# Patient Record
Sex: Male | Born: 1935 | Race: White | Hispanic: No | Smoking: Former smoker
Health system: Southern US, Community
[De-identification: ages and names within clinical notes are randomized; demographics above are authoritative.]

## PROBLEM LIST (undated history)

## (undated) DIAGNOSIS — I509 Heart failure, unspecified: Secondary | ICD-10-CM

## (undated) DIAGNOSIS — I10 Essential (primary) hypertension: Secondary | ICD-10-CM

## (undated) DIAGNOSIS — C3490 Malignant neoplasm of unspecified part of unspecified bronchus or lung: Secondary | ICD-10-CM

## (undated) DIAGNOSIS — E119 Type 2 diabetes mellitus without complications: Secondary | ICD-10-CM

## (undated) DIAGNOSIS — N189 Chronic kidney disease, unspecified: Secondary | ICD-10-CM

## (undated) DIAGNOSIS — I4891 Unspecified atrial fibrillation: Principal | ICD-10-CM

## (undated) DIAGNOSIS — J449 Chronic obstructive pulmonary disease, unspecified: Secondary | ICD-10-CM

## (undated) DIAGNOSIS — E785 Hyperlipidemia, unspecified: Secondary | ICD-10-CM

## (undated) HISTORY — DX: Essential (primary) hypertension: I10

## (undated) HISTORY — DX: Hyperlipidemia, unspecified: E78.5

## (undated) HISTORY — DX: Malignant neoplasm of unspecified part of unspecified bronchus or lung: C34.90

## (undated) HISTORY — PX: LUNG REMOVAL, PARTIAL: SHX233

## (undated) HISTORY — PX: CHOLECYSTECTOMY: SHX55

---

## 1998-05-28 ENCOUNTER — Ambulatory Visit (HOSPITAL_COMMUNITY): Admission: RE | Admit: 1998-05-28 | Discharge: 1998-05-28 | Payer: Self-pay | Admitting: Surgery

## 1998-11-23 ENCOUNTER — Encounter: Payer: Self-pay | Admitting: Surgery

## 1998-11-23 ENCOUNTER — Ambulatory Visit (HOSPITAL_COMMUNITY): Admission: RE | Admit: 1998-11-23 | Discharge: 1998-11-23 | Payer: Self-pay | Admitting: Surgery

## 1999-05-16 ENCOUNTER — Ambulatory Visit (HOSPITAL_COMMUNITY): Admission: RE | Admit: 1999-05-16 | Discharge: 1999-05-16 | Payer: Self-pay | Admitting: Surgery

## 1999-05-16 ENCOUNTER — Encounter: Payer: Self-pay | Admitting: Surgery

## 1999-11-15 ENCOUNTER — Ambulatory Visit (HOSPITAL_COMMUNITY): Admission: RE | Admit: 1999-11-15 | Discharge: 1999-11-15 | Payer: Self-pay | Admitting: Surgery

## 1999-11-15 ENCOUNTER — Encounter: Payer: Self-pay | Admitting: Surgery

## 2000-05-18 ENCOUNTER — Encounter: Payer: Self-pay | Admitting: Surgery

## 2000-05-18 ENCOUNTER — Ambulatory Visit (HOSPITAL_COMMUNITY): Admission: RE | Admit: 2000-05-18 | Discharge: 2000-05-18 | Payer: Self-pay | Admitting: Surgery

## 2000-11-19 ENCOUNTER — Ambulatory Visit (HOSPITAL_COMMUNITY): Admission: RE | Admit: 2000-11-19 | Discharge: 2000-11-19 | Payer: Self-pay | Admitting: Surgery

## 2000-11-19 ENCOUNTER — Encounter: Payer: Self-pay | Admitting: Surgery

## 2000-12-03 ENCOUNTER — Ambulatory Visit (HOSPITAL_COMMUNITY): Admission: RE | Admit: 2000-12-03 | Discharge: 2000-12-03 | Payer: Self-pay | Admitting: Surgery

## 2000-12-03 ENCOUNTER — Encounter: Payer: Self-pay | Admitting: Surgery

## 2001-05-21 ENCOUNTER — Encounter: Payer: Self-pay | Admitting: Surgery

## 2001-05-21 ENCOUNTER — Ambulatory Visit (HOSPITAL_COMMUNITY): Admission: RE | Admit: 2001-05-21 | Discharge: 2001-05-21 | Payer: Self-pay | Admitting: Surgery

## 2001-06-03 ENCOUNTER — Encounter: Payer: Self-pay | Admitting: Surgery

## 2001-06-03 ENCOUNTER — Ambulatory Visit (HOSPITAL_COMMUNITY): Admission: RE | Admit: 2001-06-03 | Discharge: 2001-06-03 | Payer: Self-pay | Admitting: Surgery

## 2001-09-20 ENCOUNTER — Encounter: Admission: RE | Admit: 2001-09-20 | Discharge: 2001-09-20 | Payer: Self-pay | Admitting: Family Medicine

## 2001-09-20 ENCOUNTER — Encounter: Payer: Self-pay | Admitting: Family Medicine

## 2001-11-15 ENCOUNTER — Encounter: Payer: Self-pay | Admitting: Surgery

## 2001-11-15 ENCOUNTER — Ambulatory Visit (HOSPITAL_COMMUNITY): Admission: RE | Admit: 2001-11-15 | Discharge: 2001-11-15 | Payer: Self-pay | Admitting: Surgery

## 2002-05-23 ENCOUNTER — Ambulatory Visit (HOSPITAL_COMMUNITY): Admission: RE | Admit: 2002-05-23 | Discharge: 2002-05-23 | Payer: Self-pay | Admitting: Surgery

## 2002-05-23 ENCOUNTER — Encounter: Payer: Self-pay | Admitting: Surgery

## 2002-11-24 ENCOUNTER — Ambulatory Visit (HOSPITAL_COMMUNITY): Admission: RE | Admit: 2002-11-24 | Discharge: 2002-11-24 | Payer: Self-pay | Admitting: Surgery

## 2002-11-24 ENCOUNTER — Encounter: Payer: Self-pay | Admitting: Surgery

## 2003-03-10 ENCOUNTER — Encounter: Admission: RE | Admit: 2003-03-10 | Discharge: 2003-03-10 | Payer: Self-pay | Admitting: Family Medicine

## 2003-03-10 ENCOUNTER — Encounter: Payer: Self-pay | Admitting: Family Medicine

## 2003-06-01 ENCOUNTER — Encounter: Payer: Self-pay | Admitting: Surgery

## 2003-06-01 ENCOUNTER — Ambulatory Visit (HOSPITAL_COMMUNITY): Admission: RE | Admit: 2003-06-01 | Discharge: 2003-06-01 | Payer: Self-pay | Admitting: Surgery

## 2003-11-23 ENCOUNTER — Ambulatory Visit (HOSPITAL_COMMUNITY): Admission: RE | Admit: 2003-11-23 | Discharge: 2003-11-23 | Payer: Self-pay | Admitting: Surgery

## 2004-05-30 ENCOUNTER — Ambulatory Visit (HOSPITAL_COMMUNITY): Admission: RE | Admit: 2004-05-30 | Discharge: 2004-05-30 | Payer: Self-pay | Admitting: Surgery

## 2004-11-25 ENCOUNTER — Ambulatory Visit (HOSPITAL_COMMUNITY): Admission: RE | Admit: 2004-11-25 | Discharge: 2004-11-25 | Payer: Self-pay | Admitting: Surgery

## 2005-11-25 ENCOUNTER — Encounter: Admission: RE | Admit: 2005-11-25 | Discharge: 2005-11-25 | Payer: Self-pay | Admitting: Family Medicine

## 2006-05-27 ENCOUNTER — Encounter: Admission: RE | Admit: 2006-05-27 | Discharge: 2006-05-27 | Payer: Self-pay | Admitting: Family Medicine

## 2006-11-26 ENCOUNTER — Encounter: Admission: RE | Admit: 2006-11-26 | Discharge: 2006-11-26 | Payer: Self-pay | Admitting: Family Medicine

## 2007-01-05 ENCOUNTER — Encounter: Admission: RE | Admit: 2007-01-05 | Discharge: 2007-01-05 | Payer: Self-pay | Admitting: Family Medicine

## 2007-01-07 ENCOUNTER — Encounter: Admission: RE | Admit: 2007-01-07 | Discharge: 2007-01-07 | Payer: Self-pay | Admitting: Family Medicine

## 2007-01-22 ENCOUNTER — Encounter (INDEPENDENT_AMBULATORY_CARE_PROVIDER_SITE_OTHER): Payer: Self-pay | Admitting: *Deleted

## 2007-01-22 ENCOUNTER — Inpatient Hospital Stay (HOSPITAL_COMMUNITY): Admission: RE | Admit: 2007-01-22 | Discharge: 2007-01-29 | Payer: Self-pay | Admitting: Surgery

## 2007-03-10 ENCOUNTER — Ambulatory Visit: Payer: Self-pay | Admitting: Pulmonary Disease

## 2007-03-11 ENCOUNTER — Ambulatory Visit: Payer: Self-pay | Admitting: Cardiology

## 2007-03-11 LAB — CONVERTED CEMR LAB: INR: 1.2 (ref 0.9–2.0)

## 2007-03-19 ENCOUNTER — Ambulatory Visit (HOSPITAL_COMMUNITY): Admission: RE | Admit: 2007-03-19 | Discharge: 2007-03-19 | Payer: Self-pay | Admitting: Pulmonary Disease

## 2007-03-19 ENCOUNTER — Encounter (INDEPENDENT_AMBULATORY_CARE_PROVIDER_SITE_OTHER): Payer: Self-pay | Admitting: Specialist

## 2007-03-22 ENCOUNTER — Ambulatory Visit (HOSPITAL_COMMUNITY): Admission: RE | Admit: 2007-03-22 | Discharge: 2007-03-22 | Payer: Self-pay | Admitting: Surgery

## 2007-03-30 ENCOUNTER — Encounter: Admission: RE | Admit: 2007-03-30 | Discharge: 2007-03-30 | Payer: Self-pay | Admitting: Diagnostic Radiology

## 2007-04-05 ENCOUNTER — Ambulatory Visit: Payer: Self-pay | Admitting: Pulmonary Disease

## 2008-03-15 DIAGNOSIS — E785 Hyperlipidemia, unspecified: Secondary | ICD-10-CM

## 2008-03-15 DIAGNOSIS — I10 Essential (primary) hypertension: Secondary | ICD-10-CM | POA: Insufficient documentation

## 2008-03-16 ENCOUNTER — Ambulatory Visit: Payer: Self-pay | Admitting: Pulmonary Disease

## 2008-03-16 ENCOUNTER — Ambulatory Visit: Payer: Self-pay | Admitting: Internal Medicine

## 2008-03-16 DIAGNOSIS — R0602 Shortness of breath: Secondary | ICD-10-CM | POA: Insufficient documentation

## 2008-03-16 DIAGNOSIS — J9610 Chronic respiratory failure, unspecified whether with hypoxia or hypercapnia: Secondary | ICD-10-CM | POA: Insufficient documentation

## 2008-03-16 LAB — CONVERTED CEMR LAB
CO2: 38 meq/L — ABNORMAL HIGH (ref 19–32)
Chloride: 96 meq/L (ref 96–112)
Sodium: 144 meq/L (ref 135–145)

## 2008-03-17 DIAGNOSIS — J984 Other disorders of lung: Secondary | ICD-10-CM | POA: Insufficient documentation

## 2008-03-17 DIAGNOSIS — J90 Pleural effusion, not elsewhere classified: Secondary | ICD-10-CM | POA: Insufficient documentation

## 2008-03-31 ENCOUNTER — Telehealth (INDEPENDENT_AMBULATORY_CARE_PROVIDER_SITE_OTHER): Payer: Self-pay | Admitting: *Deleted

## 2008-04-13 ENCOUNTER — Ambulatory Visit: Payer: Self-pay | Admitting: Pulmonary Disease

## 2008-04-27 ENCOUNTER — Ambulatory Visit: Payer: Self-pay | Admitting: Pulmonary Disease

## 2008-04-29 ENCOUNTER — Encounter: Payer: Self-pay | Admitting: Pulmonary Disease

## 2008-05-05 ENCOUNTER — Ambulatory Visit: Payer: Self-pay | Admitting: Pulmonary Disease

## 2008-08-17 ENCOUNTER — Ambulatory Visit: Payer: Self-pay | Admitting: Pulmonary Disease

## 2008-08-17 DIAGNOSIS — J438 Other emphysema: Secondary | ICD-10-CM | POA: Insufficient documentation

## 2009-02-15 ENCOUNTER — Ambulatory Visit: Payer: Self-pay | Admitting: Pulmonary Disease

## 2009-08-14 ENCOUNTER — Ambulatory Visit: Payer: Self-pay | Admitting: Pulmonary Disease

## 2009-12-16 ENCOUNTER — Emergency Department (HOSPITAL_COMMUNITY): Admission: EM | Admit: 2009-12-16 | Discharge: 2009-12-16 | Payer: Self-pay | Admitting: Emergency Medicine

## 2010-02-13 ENCOUNTER — Ambulatory Visit: Payer: Self-pay | Admitting: Pulmonary Disease

## 2010-02-13 DIAGNOSIS — J209 Acute bronchitis, unspecified: Secondary | ICD-10-CM

## 2010-08-21 ENCOUNTER — Ambulatory Visit: Payer: Self-pay | Admitting: Pulmonary Disease

## 2010-12-29 ENCOUNTER — Encounter: Payer: Self-pay | Admitting: Pulmonary Disease

## 2011-01-07 NOTE — Assessment & Plan Note (Signed)
Summary: rov for severe emphysema   Primary Provider/Referring Provider:  Lupe Carney  CC:  Pt is here for a 6 month f/u appt.  Pt states he thought he recently had "bronchitis" but states his breathing is improving and is now coughing up white sputum.  Pt denied any other complaints.  .  History of Present Illness: The pt comes in today for f/u of his known severe emphysema with chronic respiratory failure.  He has been at a stable baseline, but one to two weeks ago developed chest congestion with cough and purulent mucus.  He has been "waiting it out", and taking mucinex.  His mucus has just cleared, and his congestion is improving.  He denies any f/c/s.  Current Medications (verified): 1)  Furosemide 40 Mg  Tabs (Furosemide) .... Take 1 Tablet By Mouth Once A Day 2)  Capoten 12.5 Mg  Tabs (Captopril) .... Take 2 Tabs By Mouth Daily 3)  Proair Hfa 108 (90 Base) Mcg/act  Aers (Albuterol Sulfate) .... Inhale 2 Puffs Every 4 To 6 Hours As Needed 4)  Mucinex 600 Mg  Tb12 (Guaifenesin) .... Take 1 Tablet By Mouth Two Times A Day 5)  Allegra 180 Mg  Tabs (Fexofenadine Hcl) .... Take 1 Tablet By Mouth Once A Day As Needed 6)  Temazepam 30 Mg  Caps (Temazepam) .... Take By Mouth As Needed 7)  Spiriva Handihaler 18 Mcg  Caps (Tiotropium Bromide Monohydrate) .... Once Daily 8)  Symbicort 160-4.5 Mcg/act  Aero (Budesonide-Formoterol Fumarate) .... 2 Puffs Every 12 Hours 9)  Coumadin 5 Mg Tabs (Warfarin Sodium) .... Take As Directed By [facility] 10)  Metoprolol Tartrate 25 Mg Tabs (Metoprolol Tartrate) .... Take 1 Tablet By Mouth Once A Day  Allergies (verified): No Known Drug Allergies  Review of Systems      See HPI  Vital Signs:  Patient profile:   77 year old male Height:      67 inches Weight:      240.13 pounds O2 Sat:      92 % on 2 LPM pulsed Temp:     98.2 degrees F oral Pulse rate:   108 / minute BP sitting:   180 / 70  (left arm) Cuff size:   regular  Vitals Entered  By: Arman Filter LPN (February 13, 4165 9:46 AM)  O2 Flow:  2 LPM pulsed CC: Pt is here for a 6 month f/u appt.  Pt states he thought he recently had "bronchitis" but states his breathing is improving and is now coughing up white sputum.  Pt denied any other complaints.   Comments Medications reviewed with patient Arman Filter LPN  February 13, 629 9:46 AM    Physical Exam  General:  ow male in nad Lungs:  decreased bs throughout, but no wheezing or rhonchi Heart:  rrr Extremities:  1+ edema right>>left no cyanosis Neurologic:  alert and oriented,  moves all 4.   Impression & Recommendations:  Problem # 1:  EMPHYSEMA, SEVERE (ICD-492.8)  the pt is doing well from a pulmonary standpoint.  He is getting over an episode of what sounds like viral bronchitis, but I have asked him to call us in the future if this occurs and lasts more than a week or if it results in worsening sob.  I have asked him to continue his current meds, and to try and stay as active as possible.   Problem # 2:  ACUTE BRONCHITIS (ICD-466.0) probably viral in nature.  He is improving.  Medications Added to Medication List This Visit: 1)  Mucinex 600 Mg Tb12 (Guaifenesin) .... Take 1 tablet by mouth two times a day  Other Orders: Est. Patient Level III (69629)  Patient Instructions: 1)  no change in medications 2)  please call if you get a chest cold that lasts more than a week, or if the cold causes you to be more short of breath. 3)  followup with me in 6mos.   Immunization History:  Influenza Immunization History:    Influenza:  historical (08/08/2009)  Pneumovax Immunization History:    Pneumovax:  historical (12/08/2008)

## 2011-01-07 NOTE — Assessment & Plan Note (Signed)
Summary: rov for emphysema with chronic respiratory failure.   Primary Provider/Referring Provider:  Lupe Carney  CC:  Pt is here for a 6 month f/u appt on his emphysema.  Pt denied changes in breathing- still has periods of sob with exertion.  Marland Kitchen  History of Present Illness: the pt comes in today for f/u of his known emphysema with chronic respiratory failure.  He feels that his doe is at his usual baseline, and reports no cough or purulence.  He has been compliant with his meds, and reports no acute exacerbation since his last visit.  Medications Prior to Update: 1)  Furosemide 40 Mg  Tabs (Furosemide) .... Take 1 Tablet By Mouth Once A Day 2)  Capoten 12.5 Mg  Tabs (Captopril) .... Take 2 Tabs By Mouth Daily 3)  Proair Hfa 108 (90 Base) Mcg/act  Aers (Albuterol Sulfate) .... Inhale 2 Puffs Every 4 To 6 Hours As Needed 4)  Mucinex 600 Mg  Tb12 (Guaifenesin) .... Take 1 Tablet By Mouth Two Times A Day 5)  Allegra 180 Mg  Tabs (Fexofenadine Hcl) .... Take 1 Tablet By Mouth Once A Day As Needed 6)  Temazepam 30 Mg  Caps (Temazepam) .... Take By Mouth As Needed 7)  Spiriva Handihaler 18 Mcg  Caps (Tiotropium Bromide Monohydrate) .... Once Daily 8)  Symbicort 160-4.5 Mcg/act  Aero (Budesonide-Formoterol Fumarate) .... 2 Puffs Every 12 Hours 9)  Coumadin 5 Mg Tabs (Warfarin Sodium) .... Take As Directed By [facility] 10)  Metoprolol Tartrate 25 Mg Tabs (Metoprolol Tartrate) .... Take 1 Tablet By Mouth Once A Day  Allergies (verified): No Known Drug Allergies  Review of Systems       The patient complains of shortness of breath with activity, acid heartburn, indigestion, nasal congestion/difficulty breathing through nose, and hand/feet swelling.  The patient denies shortness of breath at rest, productive cough, non-productive cough, coughing up blood, chest pain, irregular heartbeats, loss of appetite, weight change, abdominal pain, difficulty swallowing, sore throat, tooth/dental  problems, headaches, sneezing, itching, ear ache, anxiety, depression, joint stiffness or pain, rash, change in color of mucus, and fever.    Vital Signs:  Patient profile:   77 year old male Height:      67 inches Weight:      247.50 pounds BMI:     38.90 O2 Sat:      92 % on 2 LPM pulsed Temp:     97.7 degrees F oral Pulse rate:   46 / minute BP sitting:   136 / 72  (left arm) Cuff size:   regular  Vitals Entered By: Arman Filter LPN (August 21, 2010 2:16 PM)  O2 Flow:  2 LPM pulsed CC: Pt is here for a 6 month f/u appt on his emphysema.  Pt denied changes in breathing- still has periods of sob with exertion.   Comments Medications reviewed with patient Arman Filter LPN  August 21, 2010 2:16 PM    Physical Exam  General:  obese male in nad Lungs:  decreased bs, no wheezing  a few basilar crackles. Heart:  rrr Extremities:  2+ edema bilat, no cyanosis Neurologic:  alert and oriented, moves all 4.   Impression & Recommendations:  Problem # 1:  EMPHYSEMA, SEVERE (ICD-492.8) the pt appears to be at his usual baseline, and has not had any recent flares.  I have asked him to stay on his current meds, work on weight loss and some type of physical activity, and will give flu  shot today as well.  He is to keep track of his LE edema, and to f/u with his primary if worsens.  Problem # 2:  CHRONIC RESPIRATORY FAILURE (ICD-518.83) the pt has adequate sats on his current oxygen level.  Other Orders: Est. Patient Level III (64403) Influenza Vaccine MCR (47425) T-2 View CXR (71020TC)  Patient Instructions: 1)  will give flu shot today. 2)  no change in meds 3)  stay as active as you can 4)  followup with me in 6mos.   Immunizations Administered:  Influenza Vaccine # 1:    Vaccine Type: Fluvax MCR    Site: left deltoid    Mfr: GlaxoSmithKline    Dose: 0.5 ml    Route: IM    Given by: Michel Bickers CMA    Exp. Date: 06/07/2011    Lot #: [id number]  Flu Vaccine  Consent Questions:    Do you have a history of severe allergic reactions to this vaccine? no    Any prior history of allergic reactions to egg and/or gelatin? no    Do you have a sensitivity to the preservative Thimersol? no    Do you have a past history of Guillan-Barre Syndrome? no    Do you currently have an acute febrile illness? no    Have you ever had a severe reaction to latex? no    Vaccine information given and explained to patient? yes

## 2011-02-19 ENCOUNTER — Encounter: Payer: Self-pay | Admitting: Pulmonary Disease

## 2011-02-19 ENCOUNTER — Ambulatory Visit (INDEPENDENT_AMBULATORY_CARE_PROVIDER_SITE_OTHER): Payer: Medicare Other | Admitting: Pulmonary Disease

## 2011-02-19 DIAGNOSIS — J438 Other emphysema: Secondary | ICD-10-CM

## 2011-02-19 DIAGNOSIS — J9610 Chronic respiratory failure, unspecified whether with hypoxia or hypercapnia: Secondary | ICD-10-CM

## 2011-02-25 NOTE — Assessment & Plan Note (Signed)
Summary: rov for severe emphysema   Primary Provider/Referring Provider:  Lupe Carney  CC:  6 month f/u appt.  Pt does c/o increased sob with exertion but wonders if this is d/t "allergies."  Pt states he does cough up white sputum.  Pt states his Symbicort was malfunctioning recently. Marland Kitchen  History of Present Illness: the pt comes in today for f/u of his emphysema and chronic respiratory failure.  He is doing fairly well, and maintaining a stable baseline.  He has had no recent acute exacerbation or pulmonary infection.  He is having some allergy symptoms, but no chest congestion or purulence.  Current Medications (verified): 1)  Metformin Hcl 500 Mg Tabs (Metformin Hcl) .... Take 1 Tablet By Mouth Once A Day 2)  Capoten 12.5 Mg  Tabs (Captopril) .... Take 2 Tabs By Mouth Daily 3)  Metoprolol Succinate 50 Mg Xr24h-Tab (Metoprolol Succinate) .... Take 1 Tablet By Mouth Once A Day 4)  Furosemide 40 Mg  Tabs (Furosemide) .... Take 1 Tablet By Mouth Two Times A Day 5)  Coumadin 5 Mg Tabs (Warfarin Sodium) .... Take As Directed By [facility] 6)  Symbicort 160-4.5 Mcg/act  Aero (Budesonide-Formoterol Fumarate) .... 2 Puffs Every 12 Hours 7)  Spiriva Handihaler 18 Mcg  Caps (Tiotropium Bromide Monohydrate) .... Once Daily 8)  Proair Hfa 108 (90 Base) Mcg/act  Aers (Albuterol Sulfate) .... Inhale 2 Puffs Every 4 To 6 Hours As Needed 9)  Mucinex 600 Mg  Tb12 (Guaifenesin) .... Take 1 Tablet By Mouth Two Times A Day 10)  Allegra 180 Mg  Tabs (Fexofenadine Hcl) .... Take 1 Tablet By Mouth Once A Day As Needed 11)  Temazepam 30 Mg  Caps (Temazepam) .... Take By Mouth As Needed  Allergies (verified): No Known Drug Allergies  Past History:  Past Medical History:  DYSLIPIDEMIA (ICD-272.4) HYPERTENSION (ICD-401.9)  Lung Cancer  Review of Systems       The patient complains of shortness of breath with activity, productive cough, acid heartburn, indigestion, weight change, sneezing, and  hand/feet swelling.  The patient denies shortness of breath at rest, non-productive cough, coughing up blood, chest pain, irregular heartbeats, loss of appetite, abdominal pain, difficulty swallowing, sore throat, tooth/dental problems, headaches, nasal congestion/difficulty breathing through nose, itching, ear ache, anxiety, depression, joint stiffness or pain, rash, change in color of mucus, and fever.    Vital Signs:  Patient profile:   77 year old male Height:      67 inches Weight:      238.25 pounds O2 Sat:      88 % on 2 LPM pulsed Temp:     98.0 degrees F oral Pulse rate:   85 / minute BP sitting:   138 / 70  (left arm) Cuff size:   regular  Vitals Entered By: Arman Filter LPN (February 19, 2011 9:03 AM)  O2 Flow:  2 LPM pulsed CC: 6 month f/u appt.  Pt does c/o increased sob with exertion but wonders if this is d/t "allergies."  Pt states he does cough up white sputum.  Pt states his Symbicort was malfunctioning recently.  Comments rechecked pt's o2 sats after pt rested for a few mins and took some deep breaths.  o2 sats increased to 93% on 2 LPM pulsed at rest.  Medications reviewed with patient Arman Filter LPN  February 19, 2011 9:04 AM    Physical Exam  General:  ow male in nad  Lungs:  mildly decreased bs, no wheezing or crackles.  Heart:  rrr Extremities:  2+ edema bilat, no cyanosis  Neurologic:  alert and oriented, moves all 4    Impression & Recommendations:  Problem # 1:  EMPHYSEMA, SEVERE (ICD-492.8)  the pt is maintaining a stable baseline, with no recent acute exacerbation or pulmonary infection.  I have asked him to stay on his current meds, and to work on weight loss and conditioning.  I have also asked him to increase his oxygen to 3lpm pulses if he is going to do SUSTAINED activity.    Medications Added to Medication List This Visit: 1)  Metformin Hcl 500 Mg Tabs (Metformin hcl) .... Take 1 tablet by mouth once a day 2)  Metoprolol Succinate 50 Mg  Xr24h-tab (Metoprolol succinate) .... Take 1 tablet by mouth once a day 3)  Furosemide 40 Mg Tabs (Furosemide) .... Take 1 tablet by mouth two times a day  Other Orders: Est. Patient Level III (81191)  Patient Instructions: 1)  stay on current meds 2)  try to stay as active as possible, work on some weight loss 3)  followup with me in 6mos.

## 2011-04-25 NOTE — Assessment & Plan Note (Signed)
[facility]                             PULMONARY OFFICE NOTE   NAME:COSTNEREssex, Perry                    MRN:          [id number]  DATE:03/10/2007                            DOB:          Apr 29, 1921    HISTORY OF PRESENT ILLNESS:  The patient is an 77 year old gentleman who  I have been asked to see for a pleural effusion.  The patient has a  history of lung cancer dating back to 1991 and underwent lobectomy on  the left by Dr. Tanda Rockers.  He did not require chemotherapy or radiation.  I do not have the details of this nor the cell type.  The patient  recently underwent cholecystectomy during the middle to end of February  and was never told anything about having a pleural effusion during that  hospitalization.  The patient states that he has been short of breath  every since his gallbladder surgery and required oxygen while in the  hospital and ultimately he required oxygen at home.  The patient has had  no significant cough or mucus production.  He was found to have a  pleural effusion about 2 weeks ago and was felt to possibly have  pneumonia and was placed on Ciprofloxacin.  However, it should be noted  during this time that he did not have cough, mucus or congestion.  He  had not had any fevers, chills or sweats.  The patient denies any  pleuritic chest pain or hemoptysis.  He has had lower extremity edema  but no calf tenderness. The patient was started on Coumadin just before  the surgery and kept on that at discharge.  His INR has not been  therapeutic and they have been trying to adjust the dose upward.  The  patient is unclear about why he is on Coumadin but I do note on his  discharge summary from his February hospitalization that there is a  questionable history of atrial fibrillations. The patient denies knowing  anything about this.  The patient denies any trauma to his right chest  recently.   PAST MEDICAL HISTORY:  1. Is significant  for hypertension.  2. History of lung cancer with lobectomy as stated above.  3. History of dyslipidemia.  4. Status post cholecystectomy.   MEDICATIONS:  Include:  1. Lasix 20 mg 2 daily.  2. Capat 20 mg 1 b.i.d.  3. Coumadin in varying doses.  4. K-Dur 10 mEq daily.   The patient has no known drug allergies.   SOCIAL HISTORY:  He has a history of smoking 1-1/2 packs per day for 55  years.  He has not smoked since 1980.  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   FAMILY HISTORY:  Is remarkable for his father having heart disease,  otherwise noncontributory.   REVIEW OF SYSTEMS:  As per history of present illness. Also see patient  intake form documented in the chart.   PHYSICAL EXAMINATION:  In general he is an obese male in no acute  distress.  Blood pressure is 146/62, pulse 64, temperature is 98.1.  Weight is  231  pounds.  O2 saturation on 1 liter is at 86%.  His oxygen was increased  to 2 L and it is 91%.  HEENT:  Pupils are equal and reactive to light and accommodation.  Extraocular muscles were intact.  Nares are patent without discharge,  oropharynx is clear.  NECK:  Is supple without JVD or lymphadenopathy.  There is no palpable  thyromegaly.  CHEST:  Reveals very decreased breath sounds both bases but on the right  especially with crackles being present.  There is no wheezes or rhonchi.  CARDIAC:  Reveals regular rate and rhythm with a 2/6 systolic murmur.  ABDOMEN:  Soft, nontender with good bowel sounds.  GENITAL EXAM, RECTAL EXAM, BREAST EXAM:  Was not done and not indicated.  LOWER EXTREMITIES:  Shows 2 to 3+ lower extremity edema.  Pulses were  intact distally.  There was no calf tenderness.  NEUROLOGICALLY:  Alert and oriented with no obvious observable motor  defects.   LABORATORY DATA:  Chest x-ray is reviewed from earlier in March and also  the 28 of March and there is a pleural effusion which appears to have  loculation characteristics. I  would also wonder whether there is an  underlying mass under all of this.  I will have to go back and look at  his x-rays while having his cholecystectomy to see if that was an issue  at that time.   IMPRESSION:  Right pleural effusion which appears loculated by chest x-  ray today.  Possibilities include parapneumonic effusion with loculation  or possibly related to his recent gallbladder surgery.  He did have some  fluid around his liver at the time of his surgery.  I would also  consider the possibility of congestive heart failure in light of his  significant cardiomegaly on chest x-ray; however, again it does appear  to be loculated.  I would also be concerned about the possibility of an  acute pulmonary embolus given his lower extremity edema and his non-  therapeutic Coumadin.  At this point in time, I need a little better  idea of what we are dealing with in terms of quantity of fluid and  whether or not it is loculated and there is underlying pathology.  We  will go ahead and schedule him for a CT scan of the chest.   PLAN:  1. I have asked the patient to increase his oxygen to 2 L 24 hours a      day.  2. Need records from Dr. Carolanne Grumbling for his cardiac status.  3. It is unclear to me why he is on Coumadin unless it is for      paroxysmal atrial fibrillation.  If the patient is to have any      intervention into the right effusion he will need to be off of this      and therefore I have asked him to discontinue it.  4. Spiral CT of the chest to rule out PE and evaluate his right      effusion.     Barbaraann Share, MD,FCCP  Electronically Signed    [facility]  DD: 03/11/2007  DT: 03/11/2007  Job #: [id number]   cc:   L. Lupe Carney, M.D.

## 2011-04-25 NOTE — Discharge Summary (Signed)
NAME:  Garrett Reese, BAIR NO.:  [id number]   MEDICAL RECORD NO.:  [id number]          PATIENT TYPE:  INP   LOCATION:  1433                         FACILITY:  [facility]   PHYSICIAN:  Clovis Pu. Cornett, M.D.DATE OF BIRTH:  1921-03-06   DATE OF ADMISSION:  01/22/2007  DATE OF DISCHARGE:  01/29/2007                               DISCHARGE SUMMARY   DISCHARGE SUMMARY   ADMISSION DIAGNOSES:  1. Acute on chronic cholecystitis.  2. Congestive heart failure.  3. History of atrial fibrillation.  4. History of chronic obstructive pulmonary disease.  5. Hypertension.  6. History of lung cancer status post lobectomy.  7. Asthma.   PAST SURGICAL HISTORY:  History of cholecystectomy.   DISCHARGE DIAGNOSES:  Same as above.   PROCEDURES PERFORMED:  1. Laparoscopic converted to open cholecystectomy.  2. CT scan.   BRIEF HISTORY:  The patient is an 77 year old male admitted with acute  on chronic cholecystitis.  He underwent an open cholecystectomy  secondary to severe cholecystitis.  He was admitted afterwards to  Telemetry.   HOSPITAL COURSE:  The patient was seen by Cardiology post-op.  He was  felt to be of no significant cardiac risk.  His post-op course was  complicated by significant drainage out of his JP drain.  CT scan done  on January 25, 2007 showed some fluid around the liver.  There was some  concern this may be a myeloma but his JP drainage stopped.  He was  having no pain.  He was tolerating his diet and was ambulating.  He was  having some problems with saturations and was diuresed during his  hospital admission.  He has had a right pleural effusion but this was  managed by diuresis.  He was not short of breath.  His saturations on  room air were about 92%.  It is unclear what his baseline oxygen  saturations are, but he is not on any home O2.  He was feeling well and  was ambulating.  He had hypokalemia and this was addressed with K-Dur.  Of note, his CO2  was 41 at discharge but he was comfortable and not  tachypneic, and he was tolerating being off oxygen.  He wished to go  home at this point in time to recover and he was doing well enough that  he was discharged home in satisfactory condition.   DISCHARGE INSTRUCTIONS:  He will follow up with me next week to have his  staples and drain removed.  Home PT and home nursing have been set up  for him.  He will resume his Coumadin 2.5 mg and will need to contact  his medical doctor next week.  He will be on Cipro 500 mg p.o. b.i.d.  for another 7 days secondary to bilateral lower extremity cellulitis.  He will resume his other home meds.  Condition on discharge improved.      Thomas A. Cornett, M.D.  Electronically Signed     [facility]/MEDQ  D:  01/29/2007  T:  01/29/2007  Job:  [id number]

## 2011-04-25 NOTE — Consult Note (Signed)
NAME:  Garrett Reese, Garrett Reese NO.:  [id number]   MEDICAL RECORD NO.:  [id number]          PATIENT TYPE:  INP   LOCATION:  0162                         FACILITY:  [facility]   PHYSICIAN:  Corky Crafts, MDDATE OF BIRTH:  [birth year]   DATE OF CONSULTATION:  DATE OF DISCHARGE:                                 CONSULTATION   REFERRING PHYSICIAN:  Clovis Pu. Cornett, M.D.   PRIMARY CARE PHYSICIAN:  L. Lupe Carney, M.D.   PRIMARY CARDIOLOGIST:  Armanda Magic, M.D.   REASON FOR CONSULTATION:  Atrial fibrillation.   HISTORY OF PRESENT ILLNESS:  Patient is an 77 year old man with a  history of hypertension and a recent diagnosis of new onset of atrial  fibrillation on February 1.  He was seen as part of a preoperative  workup for Dr. Mayford Knife.  He required a cholecystectomy.  He was in normal  sinus rhythm during his preoperative evaluation.  Today in the PACU, he  was found to be in atrial fibrillation.  We are consulted for further  evaluation.   The patient had an echocardiogram on February 8th showing normal LV size  and systolic function.  He had an EF of 60%.  He had mild aortic  sclerosis.  He also had a Cardiolite showing no evidence of ischemia.   PAST MEDICAL HISTORY:  1. Hypertension.  2. Coronary artery disease.  3. Lung cancer, status post lobectomy.  4. Cholelithiasis.  5. Asthma.   ALLERGIES:  No known drug allergies.   PAST SURGICAL HISTORY:  Lung surgery, cholecystectomy.   MEDICATIONS:  1. Inderal 10 mg t.i.d.  2. Capoten 12.5 mg b.i.d.  3. Lasix 20 mg 2 tablets daily.  4. Atrovent 2 puffs t.i.d.  5. Cipro 500 mg b.i.d. x10 days.  6. Coumadin, currently on hold.   FAMILY HISTORY:  Father died at age 38.  Mother died at age 43.  Brother  died at age 3 of unknown reasons.   SOCIAL HISTORY:  Patient is widowed.  He has three adult children.  He  is retired.  He does not smoke.  He quit in 1980.  Does not drink or use  any illegal drugs.   REVIEW OF SYSTEMS:  Positive for right upper quadrant pain.  No chest  pain.  No shortness of breath.  No palpitations.  No orthopnea.  No PND.  All other systems are negative.   PHYSICAL EXAMINATION:  VITAL SIGNS:  Blood pressure is 153/57, heart  rate ranging from 60-75, respiratory rate 16.  He is 96% on 2 liters.  HEENT:  Head is normocephalic and atraumatic.  Extraocular movements are  intact.  NECK:  No JVD.  CARDIOVASCULAR:  Regular rate and rhythm.  Occasional premature beat.  LUNGS:  Clear to auscultation anteriorly.  ABDOMEN:  Mild right-sided tenderness.  EXTREMITIES:  No edema.  SKIN:  No rash.  NEURO:  No focal motor or sensory deficits.  PSYCH:  Normal mood and affect.   Telemetry shows normal sinus rhythm with occasional PVCs.   LABS:  Most recently from earlier today:  Troponin negative.   ASSESSMENT:  An 77 year old with recurrent atrial fibrillation  postoperatively.   PLAN:  1. Cardiac:  Likely, the atrial fibrillation was a result of the      stress from surgery.  I will continue Inderal 10 mg t.i.d.  This      certainly could be up-titrated to 20 mg t.i.d. if the patient will      tolerate p.o.  If he will not, I would use Lopressor IV 5 mg every      4 hours and p.r.n. if he does require rate control from going back      into the atrial fibrillation.  If  there is a concern regarding his      lung disease, Diltiazem could also be used.  2. Hypertension:  Patient will have his home medications restarted as      he starts tolerating p.o.  3. Restart Coumadin once a day from a bleeding standpoint.  This will      lower his risk of stroke, given his atrial fibrillation.  4. Will follow the patient while he is in the hospital.      Corky Crafts, MD  Electronically Signed     JSV/MEDQ  D:  01/22/2007  T:  01/22/2007  Job:  [id number]

## 2011-04-25 NOTE — Assessment & Plan Note (Signed)
[facility]                             PULMONARY OFFICE NOTE   NAME:Reese, Garrett KYLLONEN                    MRN:          [id number]  DATE:04/05/2007                            DOB:          [birth year]    SUBJECTIVE:  Garrett Reese comes in today for followup after his recent  thoracentesis. The patient underwent a CT scan of his chest to better  characterize his effusion and was not found to have a pulmonary embolus.  However, he was found to have a low-density mass within the gallbladder  fossa that was suspicious for an abscess. He also had bilateral pleural  effusions, right greater than left. There was also a 4.5-mm left lower  lobe pulmonary nodule. The patient had a biliary drain placed by  interventional radiology, and it is unclear whether this turned out to  be an infection or not. Drain has since been removed. I have asked the  patient to get in touch with Dr. Luisa Hart to find out what was the  etiology of this. In the interim, the patient has had a thoracentesis  done with no evidence of malignancy, but there was numerous lymphocytes  noted. The fluid was a borderline exudate with a glucose of 159, total  protein of 3.5 and a LDH of 111. He was found to have 960 white cells  with 96% being lymphocytes. All cultures are negative. The patient feels  that his breathing is much better since the effusion has been drained.   PHYSICAL EXAMINATION:  In general, he is an obese male in no acute  distress. Blood pressure 120/66, pulse 73, temperature is 98.3, weight  is 230 pounds. O2 saturation on 2 liters is 92%.  CHEST:  Reveals decreased breath sounds in both basis but better air  movement than the prior visit.  CARDIAC EXAM:  Reveals a regular rate and rhythm with a 2/6 systolic  murmur.   IMPRESSION:  1. Bilateral pleural effusions, right greater than left, which appear      to be borderline exudate with a significant lymphocytosis. The  differential for this may be nonspecific lymphocytosis, possibility      of TB pleurisy and finally whether or not the patient could have      lymphoma. Given the significant lymphocytosis, I would typically      lean toward thoracoscopic evaluation with pleural biopsies and flow      cytometry on the pleural fluid. However, this patient has been      through quite a bit over the last few months and is very, very      debilitated. He is just starting to get stronger and make some      headway in terms of quality of life. I think we ought to place a      PPI  at this point and to watch him clinically. Certainly, if the      fluid reaccumulates, would tap it again and send for flow      cytometry. I have a long discussion with both the daughter and the      patient  and explained that he may have a lymphoma. They are okay      with watching this over the next four to eight weeks.  2. Left lower lobe pulmonary nodule. It is unclear whether this is a      granuloma or possibly a new bronchogenic cancer or possibly a      metastases from his prior lung cancer. We will follow this up with      serial CTs.   PLAN:  1. Place PPD and will read in 48 hours.  2. Patient will follow up with me in 8 weeks for a chest x-ray. He is      to call if his shortness of breath worsens prior to that, and we      can see him for possible repeat thoracentesis and      flow cytometry on the pleural fluid.  3. The patient have a followup CT in 4 to 6 months for his left lower      lobe nodule.     Barbaraann Share, MD,FCCP  Electronically Signed    KMC/MedQ  DD: 04/05/2007  DT: 04/06/2007  Job #: [id number]   cc:   L. Lupe Carney, M.D.

## 2011-04-25 NOTE — Op Note (Signed)
NAME:  Garrett Reese, Garrett Reese NO.:  [id number]   MEDICAL RECORD NO.:  [id number]          PATIENT TYPE:  AMB   LOCATION:  DAY                          FACILITY:  [facility]   PHYSICIAN:  Thomas A. Cornett, M.D.DATE OF BIRTH:  1921/02/25   DATE OF PROCEDURE:  01/22/2007  DATE OF DISCHARGE:                               OPERATIVE REPORT   PREOP DIAGNOSIS:  Acute on chronic cholecystitis.   POSTOPERATIVE DIAGNOSIS:  Acute on chronic cholecystitis.   PROCEDURE:  Laparoscopic converted to open cholecystectomy with  intraoperative cholangiogram.   SURGEON:  Clovis Pu. Cornett, M.D.   ASSISTANT:  Currie Paris, M.D.   ANESTHESIA:  General endotracheal anesthesia.   ESTIMATED BLOOD LOSS:  200 mL.   DRAIN:  A 19 Blake drain to gallbladder fossa.   SPECIMEN:  Gallbladder with gallstones to pathology.   INDICATIONS FOR PROCEDURE:  The patient is an 77 year old male who  initially was seen, about 2 weeks ago, for acute-on-chronic  cholecystitis.  The acute component resolved, but he is in need of  cholecystectomy.  He presents, today, for that.  He was a significant  high operative risk, which we discussed, but given the nature of his  disease I felt he needed surgery to have this removed.  I discussed this  with the patient as well as potential complications.  He voiced  understanding and agreed to proceed.   DESCRIPTION OF PROCEDURE:  The patient was brought to the operating room  and placed supine.  After induction of general endotracheal anesthesia  the abdomen was prepped and draped in a sterile fashion.  A 1-cm  supraumbilical incision was made.  Dissection was carried down to the  fascia.  The fascia was opened with a scalpel blade, and two Kocher  clamps were used to grasp the fascia.  A pursestring suture of #0 Vicryl  was placed and a 12-mm Hassan cannula was placed under direct vision.  Pneumoperitoneum was created to 15 mmHg of CO2 and a laparoscope was  placed.  Laparoscopy performed.   The patient was placed in reverse Trendelenburg and rolled to his left.  A second 11-mm port was placed in the subxiphoid position.  Two 5-mm  ports were placed in the right midabdomen.  CO2 was insufflated to a  pressure initially of 12 and then to 15 mmHg of CO2 which the patient  tolerated.  There was significant inflammation around the gallbladder  with the omentum densely adherent to it.  We were able to identify the  dome of the gallbladder, grasp it; and it was very hard and woody.  We  able to push away the omentum away from the gallbladder, all the way  down to the infundibulum.  The omentum was stuck and it appeared be  tenting up the distal stomach or proximal duodenum up toward the  infundibulum.  We tediously tried to dissect this away, but it so  adherent that we were unable to separate the two safely.  At this point,  into the case, I felt it was prudent to open to get better visualization  due to  obscured anatomy. At this point in time, the CO2 was released and  laparoscopic ports were removed and passed off the field.   A right subcostal incision was then made.  Dissection was carried down  through the skin, subcutaneous fat, through the abdominal wall, and into  the abdominal cavity.  Retraction was obtained using moist soaked lap  pads in a sweetheart retractor.  I was able to grab the infundibulum of  the gallbladder and pull this up.  The area that we were working on near  the duodenum was uninjured.   Once I had this, I was able to dissect out the cystic duct as the only  tubular structure entering the gallbladder.  I placed a clip on the  gallbladder side, and made a small incision in the cystic duct; and  placed a Cook cholangiogram catheter was secured it with a clip.  Intraoperative cholangiogram was then performed which showed free flow  of contrast into the common duct with flow down through into the  duodenum.  There is  free flow of contrast up to the bifurcation of the  right-and-left hepatic ducts without extravasation or stricture.  There  was some delayed filling, but I did not see any stone at the ampulla.  This appeared to fill during live views.  At this point in time, I  removed the catheter, and the cystic duct was double clipped and then  divided.   Next the cystic artery was identified.  It was double clipped and  divided.  There was significant inflammation but we kept her dissection  on the body of the gallbladder, and used the cautery.  We then came dome  down to remove the remainder of the gallbladder at this point.  This was  passed off the field.  There is significant oozing from the gallbladder  bed; this was controlled with a combination of cautery, pressure, and  Surgicel.  Irrigation was used and suctioned out.  We reexamined the  cystic duct and there was good hemostasis without evidence of bile  leakage.  At this point in time, I suctioned all excess blood and bile  out after irrigating.  I then reexamined the area around the duodenum;  and this appeared to be without injury at this point in time.  Then,  through a separate stab incision, placed a 19-Blake drain in the  gallbladder fossa.  We then removed all our packs, and counts were  correct at this point.  I palpated the abdomen; and felt no retained  sponges.   At this point in time a drain was secured with a 3-0 nylon.  The fascia  was closed with two layers of #1 PDS.  Skin staples were used to close  skin.  The umbilical fascia was closed with a pursestring suture of #0  Vicryl.  Skin staples were used to close the skin.  All final counts of  sponge, needle, and instruments were found be correct in this portion of  the case.  Sterile dressings were applied.  The patient was then awoke  taken to recovery in satisfactory condition.   ESTIMATED BLOOD LOSS:  Roughly 200-250 mL.     Thomas A. Cornett, M.D.   Electronically Signed     TAC/MEDQ  D:  01/22/2007  T:  01/22/2007  Job:  [id number]   cc:   L. Lupe Carney, M.D.  Fax: [phone]

## 2011-08-19 ENCOUNTER — Encounter: Payer: Self-pay | Admitting: Pulmonary Disease

## 2011-08-20 ENCOUNTER — Encounter: Payer: Self-pay | Admitting: Pulmonary Disease

## 2011-08-20 ENCOUNTER — Ambulatory Visit (INDEPENDENT_AMBULATORY_CARE_PROVIDER_SITE_OTHER): Payer: Medicare Other | Admitting: Pulmonary Disease

## 2011-08-20 DIAGNOSIS — J438 Other emphysema: Secondary | ICD-10-CM

## 2011-08-20 DIAGNOSIS — Z23 Encounter for immunization: Secondary | ICD-10-CM

## 2011-08-20 DIAGNOSIS — J9610 Chronic respiratory failure, unspecified whether with hypoxia or hypercapnia: Secondary | ICD-10-CM

## 2011-08-20 NOTE — Progress Notes (Signed)
Addended by: Salli Quarry on: 08/20/2011 11:03 AM   Modules accepted: Orders

## 2011-08-20 NOTE — Assessment & Plan Note (Signed)
The patient is maintaining a stable baseline on his current bronchodilator regimen and oxygen.  He feels that his exertional tolerance is unchanged, and has not had any recent pulmonary infection or acute exacerbation.  I have asked him to stay as active as possible, and to work on weight loss.  We'll give him a flu shot today, and have asked him to followup in 6 months.

## 2011-08-20 NOTE — Patient Instructions (Signed)
Will give you the flu shot today. No change in breathing meds Stay as active as you can followup with me in 6mos.

## 2011-08-20 NOTE — Progress Notes (Signed)
  Subjective:    Patient ID: Garrett Reese, male    DOB: 08-17-1921, 77 y.o.   MRN: [id number]  HPI The patient today for followup of his known COPD with chronic respiratory failure.  He is maintaining on his bronchodilator regimen and oxygen, and feels that he is doing well.  His breathing is at his normal baseline, and he denies any recent acute exacerbation or pulmonary infection.   Review of Systems  Constitutional: Negative for fever and unexpected weight change.  HENT: Positive for rhinorrhea and sneezing. Negative for ear pain, nosebleeds, congestion, sore throat, trouble swallowing, dental problem, postnasal drip and sinus pressure.   Eyes: Negative for redness and itching.  Respiratory: Negative for cough, chest tightness, shortness of breath and wheezing.   Cardiovascular: Negative for palpitations and leg swelling.  Gastrointestinal: Negative for nausea and vomiting.  Genitourinary: Negative for dysuria.  Musculoskeletal: Negative for joint swelling.  Skin: Negative for rash.  Neurological: Negative for headaches.  Hematological: Bruises/bleeds easily.  Psychiatric/Behavioral: Negative for dysphoric mood. The patient is not nervous/anxious.        Objective:   Physical Exam Obese male in no acute distress Nose without purulence or discharge noted Chest with decreased breath sounds, a few basal crackles, no wheezes or rhonchi. Cardiac exam with regular rate and rhythm, 2/6 systolic murmur Lower extremities with 1+ edema bilaterally, no cyanosis Alert and oriented, moves all 4 extremities.       Assessment & Plan:

## 2011-10-15 ENCOUNTER — Telehealth: Payer: Self-pay | Admitting: Pulmonary Disease

## 2011-10-15 NOTE — Telephone Encounter (Signed)
I spoke with pt and he states he lost his symbicort inhaler and can't get it refilled bc he has medicare. Per megan okay to leave 1 sample up front for p/u. Pt is aware sample was left upfront and nothing further was needed

## 2011-12-04 ENCOUNTER — Telehealth: Payer: Self-pay | Admitting: Pulmonary Disease

## 2011-12-04 MED ORDER — AZITHROMYCIN 250 MG PO TABS: ORAL_TABLET | ORAL | Status: AC

## 2011-12-04 NOTE — Telephone Encounter (Signed)
Pt aware rx was sent to the pharmacy and needed nothing further

## 2011-12-04 NOTE — Telephone Encounter (Signed)
ATC line busy x 3 wcb 

## 2011-12-04 NOTE — Telephone Encounter (Signed)
ATC x2 - line busy.  rx sent to [facility] id [employer] in [place].

## 2011-12-04 NOTE — Telephone Encounter (Signed)
I spoke with pt and he c/o cough w/ white-tan phlem, wheezing, chest tightness, feels like mucus is stuck in his throat x 2 days. Pt had a slight fever 2 days ago but took tylenol and it went away. Pt is taking mucinex BID. He is requesting further recs. Since [facility] is not in will forward to Dr. Maple Hudson for recs. Please advise, thanks  No Known Allergies   [facility] [address] in [place]

## 2011-12-04 NOTE — Telephone Encounter (Signed)
Per CY okay to give Zpak #1 take as directed no refills. 

## 2011-12-10 ENCOUNTER — Telehealth: Payer: Self-pay | Admitting: Pulmonary Disease

## 2011-12-10 MED ORDER — PREDNISONE 10 MG PO TABS: ORAL_TABLET | ORAL | Status: AC

## 2011-12-10 NOTE — Telephone Encounter (Signed)
If he is having congestion and wheezing, and already has taken an abx, probably needs a short course of prednisone  Prednisone 10mg  Take 4 each day for 2 days, then 3 each day for 2 days, then 2 each day for 2 days, then one each day for 2 days, then stop.

## 2011-12-10 NOTE — Telephone Encounter (Signed)
I spoke with pt and is aware of [facility] recs. Rx for prednisone has been sent to pharmacy and pt is aware of the directions. Nothing further was needed

## 2011-12-10 NOTE — Telephone Encounter (Signed)
I spoke with pt and he states he is now coughing up clear phlem, chest congestion, wheezing x 1 week now. Denies any fever, nausea, vomiting, body aches, sore throat, chest tightness. Pt was given zpak on 12/04/11 and is taking mucinex. Pt states he wants another round to help clear him up. Please advise Dr. Shelle Iron, thanks  No Known Allergies

## 2011-12-20 ENCOUNTER — Telehealth: Payer: Self-pay | Admitting: Pulmonary Disease

## 2011-12-20 MED ORDER — PREDNISONE 10 MG PO TABS: ORAL_TABLET | ORAL | Status: DC

## 2011-12-20 NOTE — Telephone Encounter (Signed)
C/o congestion is back after 8day  prednisone course completed. Clear phlegm,  No fever, dyspnea stable, cannot come into the office Prednisone 10 mg  Take 4 tabs  daily with food x 4 days, then 3 tabs daily x 4 days, then 2 tabs daily x 4 days, then 1 tab daily x4 days then stop. #40 Sent Call if no better next week

## 2011-12-25 ENCOUNTER — Telehealth: Payer: Self-pay | Admitting: Pulmonary Disease

## 2011-12-25 ENCOUNTER — Encounter: Payer: Self-pay | Admitting: Adult Health

## 2011-12-25 ENCOUNTER — Ambulatory Visit (INDEPENDENT_AMBULATORY_CARE_PROVIDER_SITE_OTHER)
Admission: RE | Admit: 2011-12-25 | Discharge: 2011-12-25 | Disposition: A | Discharge status: Home / Self Care | Payer: Medicare Other | Source: Ambulatory Visit | Attending: Adult Health | Admitting: Adult Health

## 2011-12-25 ENCOUNTER — Ambulatory Visit (INDEPENDENT_AMBULATORY_CARE_PROVIDER_SITE_OTHER): Payer: Medicare Other | Admitting: Adult Health

## 2011-12-25 VITALS — BP 132/78 | HR 84 | Temp 97.0°F | Ht 67.0 in | Wt 236.2 lb

## 2011-12-25 DIAGNOSIS — R05 Cough: Secondary | ICD-10-CM

## 2011-12-25 DIAGNOSIS — J438 Other emphysema: Secondary | ICD-10-CM

## 2011-12-25 DIAGNOSIS — I509 Heart failure, unspecified: Secondary | ICD-10-CM

## 2011-12-25 DIAGNOSIS — J9610 Chronic respiratory failure, unspecified whether with hypoxia or hypercapnia: Secondary | ICD-10-CM

## 2011-12-25 MED ORDER — AMOXICILLIN-POT CLAVULANATE 875-125 MG PO TABS: 1.0000 | ORAL_TABLET | Freq: Two times a day (BID) | ORAL | Status: AC

## 2011-12-25 MED ORDER — ALBUTEROL SULFATE HFA 108 (90 BASE) MCG/ACT IN AERS: 2.0000 | INHALATION_SPRAY | Freq: Four times a day (QID) | RESPIRATORY_TRACT | Status: AC | PRN

## 2011-12-25 NOTE — Telephone Encounter (Signed)
Pt given work in appt with Tammy Parrett at 3:00 per Tammy .  Pt sent down for cxr prior to appt

## 2011-12-25 NOTE — Telephone Encounter (Signed)
Pt ended up walking in today and seeing TP this afternoon for a sick visit.  Will close message.

## 2011-12-25 NOTE — Telephone Encounter (Signed)
Spoke with pt and given Dr Teddy Spike instructions.  Pt states that there is no way he can come to office today and doesn't have anyone who can bring him to office.  Re- emphasized the importance of pt needing cxr to r/o pneumonia.  Pt still refusing ov.  Pt instructed to go to ER to be evaluated if symptoms worsen.  Dr Shelle Iron aware.

## 2011-12-25 NOTE — Patient Instructions (Addendum)
Augmentin 875mg  Twice daily  For 7 days -take with food  Mucinex DM Twice daily  As needed  Cough/congestion  Taper Prednisone 30mg  daily x 2 days , 20mg  daily x 2 days then 1 tab daily for 2 days and then stop .  Low salt diet.  Take extra Lasix  daily for 2 days .  We are referring you to Dr. Mayford Knife to evaluate your swelling.  Please contact office for sooner follow up if symptoms do not improve or worsen or seek emergency care  follow up Dr. Shelle Iron in 2-3 weeks  Need to look at ACE

## 2011-12-25 NOTE — Progress Notes (Signed)
  Subjective:    Patient ID: Garrett Reese, male    DOB: Sep 08, 1921, 78 y.o.   MRN: [id number]  HPI 78 yo male with known hx of COPD   12/25/2011 Acute OV  Complains of 3 weeks of cough, congestion and wheezing . Initially tx with zpack and steroid taper. Minimal improvement , called in prednisone taper 12/20/11 -currently on taper.  Some better but still has cough and wheezing .  Marland Kitchen Does increased leg swelling.  No fever , hemoptysis. CXR today with chronic changes.  Says he gets some better then symptoms come right back with cough and wheezing .  + orthopnea , increased dyspnea at night if he tries to lie down.   Review of Systems Constitutional:   No  weight loss, night sweats,  Fevers, chills, + fatigue, or  lassitude.  HEENT:   No headaches,  Difficulty swallowing,  Tooth/dental problems, or  Sore throat,                No sneezing, itching, ear ache, nasal congestion, post nasal drip,   CV:  No chest pain,  Orthopnea, PND,  anasarca, dizziness, palpitations, syncope.   GI  No heartburn, indigestion, abdominal pain, nausea, vomiting, diarrhea, change in bowel habits, loss of appetite, bloody stools.   Resp:    No coughing up of blood.    No chest wall deformity  Skin: no rash or lesions.  GU: no dysuria, change in color of urine, no urgency or frequency.  No flank pain, no hematuria   MS:  No joint pain or swelling.  No decreased range of motion.  No back pain.  Psych:  No change in mood or affect. No depression or anxiety.  No memory loss.         Objective:   Physical Exam GEN: A/Ox3; pleasant , NAD,morbidly obese, in wheelchair, appears chronically ill appearing.   HEENT:  [place]/AT,  EACs-clear, TMs-wnl, NOSE-clear, THROAT-clear, no lesions, no postnasal drip or exudate noted.   NECK:  Supple w/ fair ROM; no JVD; normal carotid impulses w/o bruits; no thyromegaly or nodules palpated; no lymphadenopathy.  RESP  Coarse BS w/ bibasilar crackles no accessory muscle  use, no dullness to percussion  CARD:  RRR, no m/r/g  , venous insufficiency changes with 2+ peripheral edema, pulses intact, no cyanosis or clubbing.  GI:   Soft & nt; nml bowel sounds; no organomegaly or masses detected.  Musco: Warm bil, no deformities or joint swelling noted.   Neuro: alert, no focal deficits noted.    Skin: Warm, no lesions or rashes         Assessment & Plan:

## 2011-12-25 NOTE — Telephone Encounter (Signed)
Pt called c/o chest congestion no better - Phlegm is very thick and unable to cough much up.  Feet are now very swollen.  Pt is on 2nd round of pred taper.  Currently on 30mg /day.  Offered pt appt to see Dr Shelle Iron today but pt refused stating that is feet are so swollen he can't even get his bedroom slippers on.  Please advise.

## 2011-12-25 NOTE — Telephone Encounter (Signed)
Let the pt know that I think it is very important that he come in and be seen.  He needs cxr to make sure he doesn;t have pna, and we need to see how much of an issue this fluid retention is.  Tell him to put on some socks and come on!.  We can get a wheelchair at the street to get him upstairs.  Tell him I don't think calling in more meds without seeing him is the right thing to do medically.

## 2011-12-26 NOTE — Assessment & Plan Note (Signed)
COPD exacerbation w/ suspected fluid retention/vol overload complicating his condition Suspect ACE is aggravating his cough and causing recurrent flare  Have recommended he discuss with dr. Mayford Knife to consider changing this and also to evaluate for  Decompensated hrt failure   Plan : Augmentin 875mg  Twice daily  For 7 days -take with food  Mucinex DM Twice daily  As needed  Cough/congestion  Taper Prednisone 30mg  daily x 2 days , 20mg  daily x 2 days then 1 tab daily for 2 days and then stop .  Low salt diet.  Take extra Lasix  daily for 2 days .  We are referring you to Dr. Mayford Knife to evaluate your swelling.  Please contact office for sooner follow up if symptoms do not improve or worsen or seek emergency care  follow up Dr. Shelle Iron in 2-3 weeks  Need to look at ACE

## 2011-12-29 NOTE — Progress Notes (Signed)
Acute ov reviewed and agree with plan as outlined.

## 2012-01-12 ENCOUNTER — Ambulatory Visit: Payer: Medicare Other | Admitting: Pulmonary Disease

## 2012-01-23 ENCOUNTER — Encounter: Payer: Self-pay | Admitting: Pulmonary Disease

## 2012-01-23 ENCOUNTER — Ambulatory Visit (INDEPENDENT_AMBULATORY_CARE_PROVIDER_SITE_OTHER): Payer: Medicare Other | Admitting: Pulmonary Disease

## 2012-01-23 DIAGNOSIS — J438 Other emphysema: Secondary | ICD-10-CM

## 2012-01-23 DIAGNOSIS — J9610 Chronic respiratory failure, unspecified whether with hypoxia or hypercapnia: Secondary | ICD-10-CM

## 2012-01-23 NOTE — Assessment & Plan Note (Signed)
The patient is much improved after treatment of his acute asthmatic bronchitis and acute volume overload.  He feels that he is back to his usual pulmonary baseline.  I've asked him to continue his current medications, but to also work hard on weight reduction and some type of conditioning program.

## 2012-01-23 NOTE — Progress Notes (Signed)
  Subjective:    Patient ID: Garrett Reese, male    DOB: 10-26-1921, 78 y.o.   MRN: [id number]  HPI The patient comes in today for followup of his known COPD and chronic respiratory failure, as well as a component of heart failure.  He has had recent issues with worsening shortness of breath, and felt to be multifactorial.  He has been treated for acute asthmatic bronchitis with antibiotics and steroids, and his diuretics have also been increased to try and get rid of fluid.  The patient currently feels that he is much improved, and back to his usual baseline from a pulmonary standpoint.  However, he remains very weak and deconditioned from his lengthy illness.   Review of Systems  Constitutional: Negative for fever and unexpected weight change.  HENT: Positive for rhinorrhea and sneezing. Negative for ear pain, nosebleeds, congestion, sore throat, trouble swallowing, dental problem, postnasal drip and sinus pressure.   Eyes: Negative for redness and itching.  Respiratory: Positive for shortness of breath. Negative for cough, chest tightness and wheezing.   Cardiovascular: Positive for leg swelling. Negative for palpitations.  Gastrointestinal: Negative for nausea and vomiting.  Genitourinary: Negative for dysuria.  Musculoskeletal: Negative for joint swelling.  Skin: Negative for rash.  Neurological: Negative for headaches.  Hematological: Bruises/bleeds easily.  Psychiatric/Behavioral: Negative for dysphoric mood. The patient is not nervous/anxious.        Objective:   Physical Exam Obese male in no acute distress Nose without purulence or discharge noted Chest with decreased breath sounds, but no wheezes.  Mild basal crackles Cardiac exam with slightly irregular rhythm but controlled ventricular response Lower extremities with 1-2+ edema, no cyanosis Alert and oriented, moves all 4 extremities.       Assessment & Plan:

## 2012-01-23 NOTE — Patient Instructions (Signed)
No change in meds Stay as active as possible followup with me in 4mos if doing well.

## 2012-02-18 ENCOUNTER — Ambulatory Visit: Payer: Medicare Other | Admitting: Pulmonary Disease

## 2012-05-26 ENCOUNTER — Encounter: Payer: Self-pay | Admitting: Pulmonary Disease

## 2012-05-26 ENCOUNTER — Ambulatory Visit (INDEPENDENT_AMBULATORY_CARE_PROVIDER_SITE_OTHER): Payer: Medicare Other | Admitting: Pulmonary Disease

## 2012-05-26 VITALS — BP 130/70 | HR 52 | Temp 97.7°F | Ht 67.0 in | Wt 227.0 lb

## 2012-05-26 DIAGNOSIS — J438 Other emphysema: Secondary | ICD-10-CM

## 2012-05-26 NOTE — Progress Notes (Signed)
  Subjective:    Patient ID: Garrett Reese, male    DOB: [birth year], 78 y.o.   MRN: [id number]  HPI The patient comes in today for followup of his noncontributory failure secondary to COPD and chronic heart failure.  At the last visit, he was felt to be volume overloaded, and cardiology has increased his Lasix.  His weight is down 9 pounds since the last visit.  The patient is staying compliant on his current medications, and feels that his breathing currently is at baseline.  He denies any significant cough or congestion.   Review of Systems  Constitutional: Negative.  Negative for fever and unexpected weight change.  HENT: Positive for rhinorrhea, sneezing and postnasal drip. Negative for ear pain, nosebleeds, congestion, sore throat, trouble swallowing, dental problem and sinus pressure.   Eyes: Negative.  Negative for redness and itching.  Respiratory: Positive for shortness of breath. Negative for cough, chest tightness and wheezing.   Cardiovascular: Positive for leg swelling. Negative for palpitations.  Gastrointestinal: Negative.  Negative for nausea and vomiting.  Genitourinary: Negative.  Negative for dysuria.  Musculoskeletal: Negative.  Negative for joint swelling.  Skin: Negative.  Negative for rash.  Neurological: Negative.  Negative for headaches.  Hematological: Negative.  Does not bruise/bleed easily.  Psychiatric/Behavioral: Negative.  Negative for dysphoric mood. The patient is not nervous/anxious.        Objective:   Physical Exam Obese male in no acute distress Nose without purulence or discharge noted Chest with mild decreased breath sounds, a few basilar crackles, no wheezing Cardiac exam is regular rate and rhythm Lower extremities with 2+ edema, no cyanosis Alert and oriented, moves all 4 extremities.       Assessment & Plan:

## 2012-05-26 NOTE — Assessment & Plan Note (Signed)
The patient is stable from a COPD standpoint, with nothing to sleep just bronchospasm or an acute exacerbation.  Considering his age and his multisystem disease, he is really doing fairly well.  I've asked him to stay on his pulmonary medications, keep up with his diuretic and fluid status, and to work on getting some type of daily exercise if possible.  Will followup again with me in 4 months.

## 2012-05-26 NOTE — Patient Instructions (Addendum)
No change in medications Try to stay as active as possible, but do not go outside unless less than 85 degrees. Stay on your fluid medication, which is a big contributor to your shortness of breath If doing well, followup with me in 4mos.

## 2012-07-15 LAB — CBC
HCT: 39.9 % — ABNORMAL LOW (ref 40.0–52.0)
MCH: 31.4 pg (ref 26.0–34.0)
MCV: 93 fL (ref 80–100)
Platelet: 171 10*3/uL (ref 150–440)
RBC: 4.3 10*6/uL — ABNORMAL LOW (ref 4.40–5.90)

## 2012-07-15 LAB — MAGNESIUM: Magnesium: 1.7 mg/dL — ABNORMAL LOW

## 2012-07-15 LAB — TROPONIN I: Troponin-I: 0.13 ng/mL — ABNORMAL HIGH

## 2012-07-15 LAB — TSH: Thyroid Stimulating Horm: 1.09 u[IU]/mL

## 2012-07-15 LAB — PRO B NATRIURETIC PEPTIDE: B-Type Natriuretic Peptide: 4079 pg/mL — ABNORMAL HIGH (ref 0–450)

## 2012-07-15 LAB — BASIC METABOLIC PANEL
Anion Gap: 7 (ref 7–16)
BUN: 28 mg/dL — ABNORMAL HIGH (ref 7–18)
EGFR (African American): 49 — ABNORMAL LOW
EGFR (Non-African Amer.): 42 — ABNORMAL LOW
Glucose: 210 mg/dL — ABNORMAL HIGH (ref 65–99)
Osmolality: 293 (ref 275–301)

## 2012-07-15 LAB — CK TOTAL AND CKMB (NOT AT ARMC)
CK, Total: 40 U/L (ref 35–232)
CK-MB: 0.7 ng/mL (ref 0.5–3.6)

## 2012-07-15 LAB — APTT: Activated PTT: 48.8 secs — ABNORMAL HIGH (ref 23.6–35.9)

## 2012-07-15 LAB — PROTIME-INR: INR: 2.2

## 2012-07-16 ENCOUNTER — Inpatient Hospital Stay: Payer: Self-pay | Admitting: Internal Medicine

## 2012-07-16 DIAGNOSIS — I4891 Unspecified atrial fibrillation: Secondary | ICD-10-CM

## 2012-07-16 DIAGNOSIS — I059 Rheumatic mitral valve disease, unspecified: Secondary | ICD-10-CM

## 2012-07-16 LAB — URINALYSIS, COMPLETE
Bacteria: NONE SEEN
Bilirubin,UR: NEGATIVE
Blood: NEGATIVE
Glucose,UR: 50 mg/dL (ref 0–75)
Leukocyte Esterase: NEGATIVE
Nitrite: NEGATIVE
RBC,UR: 5 /HPF (ref 0–5)
Squamous Epithelial: <1

## 2012-07-16 LAB — TROPONIN I: Troponin-I: 0.14 ng/mL — ABNORMAL HIGH

## 2012-07-17 DIAGNOSIS — I5030 Unspecified diastolic (congestive) heart failure: Secondary | ICD-10-CM

## 2012-07-17 DIAGNOSIS — R0602 Shortness of breath: Secondary | ICD-10-CM

## 2012-07-17 LAB — LIPID PANEL
HDL Cholesterol: 35 mg/dL — ABNORMAL LOW (ref 40–60)
Ldl Cholesterol, Calc: 105 mg/dL — ABNORMAL HIGH (ref 0–100)
Triglycerides: 102 mg/dL (ref 0–200)
VLDL Cholesterol, Calc: 20 mg/dL (ref 5–40)

## 2012-07-17 LAB — CREATININE, SERUM: Creatinine: 1.42 mg/dL — ABNORMAL HIGH (ref 0.60–1.30)

## 2012-07-17 LAB — URINE CULTURE

## 2012-07-18 LAB — BASIC METABOLIC PANEL
Calcium, Total: 9.1 mg/dL (ref 8.5–10.1)
Chloride: 101 mmol/L (ref 98–107)
EGFR (African American): 52 — ABNORMAL LOW
EGFR (Non-African Amer.): 45 — ABNORMAL LOW
Glucose: 119 mg/dL — ABNORMAL HIGH (ref 65–99)
Osmolality: 294 (ref 275–301)

## 2012-07-18 LAB — PROTIME-INR
INR: 2.3
Prothrombin Time: 25.8 secs — ABNORMAL HIGH (ref 11.5–14.7)

## 2012-07-18 LAB — CBC WITH DIFFERENTIAL/PLATELET
Basophil %: 0.9 %
Eosinophil #: 0.2 10*3/uL (ref 0.0–0.7)
Eosinophil %: 1.3 %
HCT: 36.1 % — ABNORMAL LOW (ref 40.0–52.0)
HGB: 12.3 g/dL — ABNORMAL LOW (ref 13.0–18.0)
Lymphocyte %: 7.9 %
MCH: 31.6 pg (ref 26.0–34.0)
MCHC: 34 g/dL (ref 32.0–36.0)
Monocyte #: 0.8 x10 3/mm (ref 0.2–1.0)
Monocyte %: 6.8 %
Neutrophil #: 10 10*3/uL — ABNORMAL HIGH (ref 1.4–6.5)
Neutrophil %: 83.1 %
RBC: 3.89 10*6/uL — ABNORMAL LOW (ref 4.40–5.90)
WBC: 12 10*3/uL — ABNORMAL HIGH (ref 3.8–10.6)

## 2012-07-19 DIAGNOSIS — I5031 Acute diastolic (congestive) heart failure: Secondary | ICD-10-CM

## 2012-07-19 LAB — BASIC METABOLIC PANEL
Anion Gap: 6 — ABNORMAL LOW (ref 7–16)
BUN: 32 mg/dL — ABNORMAL HIGH (ref 7–18)
Chloride: 100 mmol/L (ref 98–107)
Creatinine: 1.14 mg/dL (ref 0.60–1.30)
EGFR (African American): >60
EGFR (Non-African Amer.): 57 — ABNORMAL LOW
Osmolality: 292 (ref 275–301)
Sodium: 142 mmol/L (ref 136–145)

## 2012-09-24 ENCOUNTER — Ambulatory Visit (INDEPENDENT_AMBULATORY_CARE_PROVIDER_SITE_OTHER): Payer: Medicare Other | Admitting: Pulmonary Disease

## 2012-09-24 ENCOUNTER — Encounter: Payer: Self-pay | Admitting: Pulmonary Disease

## 2012-09-24 VITALS — BP 150/80 | HR 60 | Temp 97.7°F | Ht 66.0 in | Wt 212.8 lb

## 2012-09-24 DIAGNOSIS — J438 Other emphysema: Secondary | ICD-10-CM

## 2012-09-24 DIAGNOSIS — J9610 Chronic respiratory failure, unspecified whether with hypoxia or hypercapnia: Secondary | ICD-10-CM

## 2012-09-24 NOTE — Assessment & Plan Note (Signed)
The patient appears to be at his usual baseline with respect to his underlying emphysema.  He has not had a recent acute exacerbation or pulmonary infection.  He has had ongoing fluid retention, and I have stressed to him the importance of weighing daily, and letting his primary doctor or if his weight is increasing.  From a pulmonary standpoint, I have asked him to continue on his current medication, and to work hard on conditioning and weight loss.  He is already had his flu shot this fall.

## 2012-09-24 NOTE — Progress Notes (Signed)
  Subjective:    Patient ID: Garrett Reese, male    DOB: 09-26-1921, 78 y.o.   MRN: [id number]  HPI The patient comes in today for followup of his known chronic respiratory failure secondary to COPD.  He also has underlying chronic congestive heart failure, and has had ongoing issues with volume overload.  He comes in today where he feels that he is near his usual baseline, and has not had a recent acute exacerbation.  Apparently he was in the hospital a few months ago for what sounds like congestive heart failure, however a detailed discharge summary is not available to me.  He denies any significant chest congestion her mucus, but does have persistent lower extremity edema.  He is not weighing himself daily.   Review of Systems  Constitutional: Negative for fever and unexpected weight change.  HENT: Positive for congestion and rhinorrhea. Negative for ear pain, nosebleeds, sore throat, sneezing, trouble swallowing, dental problem, postnasal drip and sinus pressure.   Eyes: Negative for redness and itching.  Respiratory: Positive for shortness of breath. Negative for cough, chest tightness and wheezing.   Cardiovascular: Positive for leg swelling. Negative for palpitations.  Gastrointestinal: Negative for nausea and vomiting.  Genitourinary: Negative for dysuria.  Musculoskeletal: Positive for joint swelling.  Skin: Negative for rash.  Neurological: Negative for headaches.  Hematological: Bruises/bleeds easily.  Psychiatric/Behavioral: Negative for dysphoric mood. The patient is not nervous/anxious.        Objective:   Physical Exam Obese male in no acute distress Nose without purulence or discharge noted Chest with decreased breath sounds throughout, no wheezing Cardiac exam with regular rate and rhythm Lower extremities with 2+ edema, no cyanosis Alert and oriented, moves all 4 extremities        Assessment & Plan:

## 2012-09-24 NOTE — Patient Instructions (Addendum)
No change in breathing medications  Stay as active as possible. Keep track of your weight daily as we discussed, and let you primary md know if the weight is going up over a short period of time followup with me in 4mos.

## 2012-10-16 ENCOUNTER — Inpatient Hospital Stay: Payer: Self-pay | Admitting: Internal Medicine

## 2012-10-16 LAB — COMPREHENSIVE METABOLIC PANEL
Albumin: 3.2 g/dL — ABNORMAL LOW (ref 3.4–5.0)
Alkaline Phosphatase: 98 U/L (ref 50–136)
Anion Gap: 5 — ABNORMAL LOW (ref 7–16)
BUN: 48 mg/dL — ABNORMAL HIGH (ref 7–18)
Bilirubin,Total: 0.7 mg/dL (ref 0.2–1.0)
Creatinine: 1.34 mg/dL — ABNORMAL HIGH (ref 0.60–1.30)
Glucose: 178 mg/dL — ABNORMAL HIGH (ref 65–99)
Osmolality: 296 (ref 275–301)
Potassium: 3.8 mmol/L (ref 3.5–5.1)
SGOT(AST): 24 U/L (ref 15–37)
Sodium: 140 mmol/L (ref 136–145)
Total Protein: 7.8 g/dL (ref 6.4–8.2)

## 2012-10-16 LAB — URINALYSIS, COMPLETE
Bacteria: NONE SEEN
Bilirubin,UR: NEGATIVE
Blood: NEGATIVE
Glucose,UR: 50 mg/dL (ref 0–75)
Ketone: NEGATIVE
Leukocyte Esterase: NEGATIVE
RBC,UR: 1 /HPF (ref 0–5)
Squamous Epithelial: <1
WBC UR: 1 /HPF (ref 0–5)

## 2012-10-16 LAB — CK TOTAL AND CKMB (NOT AT ARMC): CK, Total: 103 U/L (ref 35–232)

## 2012-10-16 LAB — CBC
HGB: 11.1 g/dL — ABNORMAL LOW (ref 13.0–18.0)
MCHC: 33.5 g/dL (ref 32.0–36.0)
MCV: 91 fL (ref 80–100)
Platelet: 192 10*3/uL (ref 150–440)
RBC: 3.62 10*6/uL — ABNORMAL LOW (ref 4.40–5.90)
RDW: 13.9 % (ref 11.5–14.5)
WBC: 13.5 10*3/uL — ABNORMAL HIGH (ref 3.8–10.6)

## 2012-10-16 LAB — LIPASE, BLOOD: Lipase: 101 U/L (ref 73–393)

## 2012-10-16 LAB — PROTIME-INR: Prothrombin Time: 31 secs — ABNORMAL HIGH (ref 11.5–14.7)

## 2012-10-17 LAB — CBC WITH DIFFERENTIAL/PLATELET
Basophil %: 0.5 %
Eosinophil %: 0.8 %
HCT: 27 % — ABNORMAL LOW (ref 40.0–52.0)
HGB: 8.9 g/dL — ABNORMAL LOW (ref 13.0–18.0)
Lymphocyte %: 6.9 %
MCH: 30.4 pg (ref 26.0–34.0)
MCHC: 33.1 g/dL (ref 32.0–36.0)
MCV: 92 fL (ref 80–100)
Monocyte #: 0.9 x10 3/mm (ref 0.2–1.0)
RDW: 14.1 % (ref 11.5–14.5)
WBC: 11.1 10*3/uL — ABNORMAL HIGH (ref 3.8–10.6)

## 2012-10-17 LAB — BASIC METABOLIC PANEL
Anion Gap: 4 — ABNORMAL LOW (ref 7–16)
Calcium, Total: 8.6 mg/dL (ref 8.5–10.1)
Chloride: 103 mmol/L (ref 98–107)
Co2: 36 mmol/L — ABNORMAL HIGH (ref 21–32)
EGFR (Non-African Amer.): 47 — ABNORMAL LOW
Osmolality: 298 (ref 275–301)

## 2012-10-17 LAB — PROTIME-INR
INR: 3.6
Prothrombin Time: 36.2 secs — ABNORMAL HIGH (ref 11.5–14.7)

## 2012-10-17 LAB — LIPID PANEL
HDL Cholesterol: 34 mg/dL — ABNORMAL LOW (ref 40–60)
Ldl Cholesterol, Calc: 104 mg/dL — ABNORMAL HIGH (ref 0–100)
Triglycerides: 131 mg/dL (ref 0–200)
VLDL Cholesterol, Calc: 26 mg/dL (ref 5–40)

## 2012-10-17 LAB — TROPONIN I: Troponin-I: 0.13 ng/mL — ABNORMAL HIGH

## 2012-10-17 LAB — HEMOGLOBIN A1C: Hemoglobin A1C: 6.7 % — ABNORMAL HIGH (ref 4.2–6.3)

## 2012-10-18 LAB — CBC WITH DIFFERENTIAL/PLATELET
Basophil #: 0.1 10*3/uL (ref 0.0–0.1)
Eosinophil %: 0.5 %
HCT: 25.2 % — ABNORMAL LOW (ref 40.0–52.0)
HGB: 8.6 g/dL — ABNORMAL LOW (ref 13.0–18.0)
Lymphocyte #: 1.1 10*3/uL (ref 1.0–3.6)
Lymphocyte %: 7.8 %
Monocyte %: 8.2 %
Neutrophil #: 11.8 10*3/uL — ABNORMAL HIGH (ref 1.4–6.5)
Neutrophil %: 83.1 %
RBC: 2.75 10*6/uL — ABNORMAL LOW (ref 4.40–5.90)
WBC: 14.2 10*3/uL — ABNORMAL HIGH (ref 3.8–10.6)

## 2012-10-19 LAB — CBC WITH DIFFERENTIAL/PLATELET
Basophil #: 0 10*3/uL (ref 0.0–0.1)
Basophil %: 0.3 %
Eosinophil #: 0.1 10*3/uL (ref 0.0–0.7)
Eosinophil %: 0.9 %
HCT: 25.6 % — ABNORMAL LOW (ref 40.0–52.0)
HGB: 8.7 g/dL — ABNORMAL LOW (ref 13.0–18.0)
Lymphocyte #: 1.2 10*3/uL (ref 1.0–3.6)
MCH: 31.2 pg (ref 26.0–34.0)
MCHC: 33.9 g/dL (ref 32.0–36.0)
MCV: 92 fL (ref 80–100)
Monocyte #: 0.8 x10 3/mm (ref 0.2–1.0)
Neutrophil #: 9.3 10*3/uL — ABNORMAL HIGH (ref 1.4–6.5)
Neutrophil %: 81 %
Platelet: 187 10*3/uL (ref 150–440)
RBC: 2.78 10*6/uL — ABNORMAL LOW (ref 4.40–5.90)
RDW: 14.5 % (ref 11.5–14.5)

## 2012-10-19 LAB — BASIC METABOLIC PANEL
Anion Gap: 10 (ref 7–16)
BUN: 46 mg/dL — ABNORMAL HIGH (ref 7–18)
Chloride: 96 mmol/L — ABNORMAL LOW (ref 98–107)
Creatinine: 1.69 mg/dL — ABNORMAL HIGH (ref 0.60–1.30)
EGFR (African American): 41 — ABNORMAL LOW
EGFR (Non-African Amer.): 35 — ABNORMAL LOW
Glucose: 134 mg/dL — ABNORMAL HIGH (ref 65–99)
Osmolality: 286 (ref 275–301)

## 2012-10-21 LAB — CULTURE, BLOOD (SINGLE)

## 2012-10-21 LAB — WOUND AEROBIC CULTURE

## 2012-10-31 ENCOUNTER — Inpatient Hospital Stay: Payer: Self-pay | Admitting: Student

## 2012-10-31 LAB — CBC
HCT: 30.6 % — ABNORMAL LOW (ref 40.0–52.0)
MCH: 32.2 pg (ref 26.0–34.0)
MCV: 93 fL (ref 80–100)
Platelet: 172 10*3/uL (ref 150–440)
RBC: 3.28 10*6/uL — ABNORMAL LOW (ref 4.40–5.90)
RDW: 15.1 % — ABNORMAL HIGH (ref 11.5–14.5)

## 2012-10-31 LAB — URINALYSIS, COMPLETE
Bacteria: NONE SEEN
Blood: NEGATIVE
Hyaline Cast: 4
Ketone: NEGATIVE
Nitrite: NEGATIVE
Ph: 6 (ref 4.5–8.0)
Protein: NEGATIVE
Specific Gravity: 1.012 (ref 1.003–1.030)
Squamous Epithelial: 1
WBC UR: <1 /HPF (ref 0–5)

## 2012-10-31 LAB — COMPREHENSIVE METABOLIC PANEL
Alkaline Phosphatase: 155 U/L — ABNORMAL HIGH (ref 50–136)
Anion Gap: 5 — ABNORMAL LOW (ref 7–16)
Calcium, Total: 9.3 mg/dL (ref 8.5–10.1)
Co2: 37 mmol/L — ABNORMAL HIGH (ref 21–32)
Creatinine: 1.71 mg/dL — ABNORMAL HIGH (ref 0.60–1.30)
EGFR (African American): 40 — ABNORMAL LOW
EGFR (Non-African Amer.): 35 — ABNORMAL LOW
SGOT(AST): 32 U/L (ref 15–37)
SGPT (ALT): 19 U/L (ref 12–78)
Total Protein: 7.9 g/dL (ref 6.4–8.2)

## 2012-10-31 LAB — TROPONIN I: Troponin-I: 0.14 ng/mL — ABNORMAL HIGH

## 2012-11-01 ENCOUNTER — Ambulatory Visit: Payer: Self-pay | Admitting: Internal Medicine

## 2012-11-01 LAB — TROPONIN I
Troponin-I: 0.14 ng/mL — ABNORMAL HIGH
Troponin-I: 0.14 ng/mL — ABNORMAL HIGH

## 2012-11-02 DIAGNOSIS — I509 Heart failure, unspecified: Secondary | ICD-10-CM

## 2012-11-02 LAB — BASIC METABOLIC PANEL
Anion Gap: 5 — ABNORMAL LOW (ref 7–16)
BUN: 38 mg/dL — ABNORMAL HIGH (ref 7–18)
Chloride: 97 mmol/L — ABNORMAL LOW (ref 98–107)
Creatinine: 1.71 mg/dL — ABNORMAL HIGH (ref 0.60–1.30)
EGFR (African American): 40 — ABNORMAL LOW
EGFR (Non-African Amer.): 35 — ABNORMAL LOW
Glucose: 160 mg/dL — ABNORMAL HIGH (ref 65–99)
Sodium: 139 mmol/L (ref 136–145)

## 2012-11-07 ENCOUNTER — Ambulatory Visit: Payer: Self-pay | Admitting: Internal Medicine

## 2012-11-12 ENCOUNTER — Encounter: Payer: Self-pay | Admitting: Pulmonary Disease

## 2012-11-12 ENCOUNTER — Ambulatory Visit (INDEPENDENT_AMBULATORY_CARE_PROVIDER_SITE_OTHER): Payer: Medicare Other | Admitting: Pulmonary Disease

## 2012-11-12 VITALS — BP 128/72 | HR 74 | Temp 98.1°F | Ht 65.5 in | Wt 203.0 lb

## 2012-11-12 DIAGNOSIS — J438 Other emphysema: Secondary | ICD-10-CM

## 2012-11-12 DIAGNOSIS — J9610 Chronic respiratory failure, unspecified whether with hypoxia or hypercapnia: Secondary | ICD-10-CM

## 2012-11-12 NOTE — Patient Instructions (Addendum)
No change in breathing medications. Will have advanced check your oxygen flow on your concentrator.  Keep already scheduled apptm with me.

## 2012-11-12 NOTE — Progress Notes (Signed)
  Subjective:    Patient ID: Garrett Reese, male    DOB: 11-26-1921, 78 y.o.   MRN: [id number]  HPI Patient comes in today for followup of his known COPD and chronic respiratory failure.  He has been doing a lot of falling for unknown reasons, and his family member was concerned that it may be related to his breathing.  The patient denies to me any worsening shortness of breath, congestion, or purulent mucus.  His oxygen saturations are excellent today.  He has not had any trauma to his chest or rib pain.   Review of Systems  Constitutional: Negative for fever and unexpected weight change.  HENT: Negative for ear pain, nosebleeds, congestion, sore throat, rhinorrhea, sneezing, trouble swallowing, dental problem, postnasal drip and sinus pressure.   Eyes: Negative for redness and itching.  Respiratory: Positive for shortness of breath. Negative for cough, chest tightness and wheezing.   Cardiovascular: Negative for palpitations and leg swelling.  Gastrointestinal: Negative for nausea and vomiting.  Genitourinary: Negative for dysuria.  Musculoskeletal: Negative for joint swelling.  Skin: Negative for rash.  Neurological: Positive for headaches ( HA before falling).  Hematological: Does not bruise/bleed easily.  Psychiatric/Behavioral: Negative for dysphoric mood. The patient is not nervous/anxious.        Objective:   Physical Exam Overweight male in no acute distress Nose without purulence or discharge noted Neck without lymphadenopathy or thyromegaly Chest with clear breath sounds, occasional basilar crackle, no wheezing Cardiac exam with regular rate and rhythm Lower extremities with 2+ edema, no cyanosis Alert and oriented, moves all 4 extremities.       Assessment & Plan:

## 2012-11-12 NOTE — Assessment & Plan Note (Signed)
The patient appears to be stable from a pulmonary standpoint on his current medications.  His lungs are actually fairly clear, and his oxygen saturations are at the best they have been in many visits.  There is nothing on chest exam to suggest pneumonia or pleural effusion.  I've asked him to continue on his current medication as well as oxygen.  We will have his medical equipment [employer] check his oxygen flow at 96 feet.

## 2012-11-15 ENCOUNTER — Telehealth: Payer: Self-pay | Admitting: Pulmonary Disease

## 2012-11-19 NOTE — Telephone Encounter (Signed)
Noted and will forward to KC as an FYI 

## 2012-12-28 ENCOUNTER — Inpatient Hospital Stay: Payer: Self-pay | Admitting: Internal Medicine

## 2012-12-28 LAB — CBC WITH DIFFERENTIAL/PLATELET
Basophil #: 0 10*3/uL (ref 0.0–0.1)
Basophil %: 0.1 %
Eosinophil #: 0 10*3/uL (ref 0.0–0.7)
Eosinophil %: 0 %
HCT: 37 % — ABNORMAL LOW (ref 40.0–52.0)
HGB: 12.1 g/dL — ABNORMAL LOW (ref 13.0–18.0)
Lymphocyte #: 0.6 10*3/uL — ABNORMAL LOW (ref 1.0–3.6)
MCH: 30 pg (ref 26.0–34.0)
Monocyte #: 1.2 x10 3/mm — ABNORMAL HIGH (ref 0.2–1.0)
Neutrophil #: 25.9 10*3/uL — ABNORMAL HIGH (ref 1.4–6.5)
Neutrophil %: 93.3 %
Platelet: 213 10*3/uL (ref 150–440)
WBC: 27.8 10*3/uL — ABNORMAL HIGH (ref 3.8–10.6)

## 2012-12-28 LAB — CK TOTAL AND CKMB (NOT AT ARMC)
CK, Total: 45 U/L (ref 35–232)
CK-MB: 1.8 ng/mL (ref 0.5–3.6)

## 2012-12-28 LAB — BASIC METABOLIC PANEL
BUN: 49 mg/dL — ABNORMAL HIGH (ref 7–18)
Calcium, Total: 9.2 mg/dL (ref 8.5–10.1)
Chloride: 98 mmol/L (ref 98–107)
Co2: 38 mmol/L — ABNORMAL HIGH (ref 21–32)
EGFR (Non-African Amer.): 29 — ABNORMAL LOW
Glucose: 132 mg/dL — ABNORMAL HIGH (ref 65–99)
Osmolality: 298 (ref 275–301)

## 2012-12-28 LAB — PROTIME-INR: Prothrombin Time: 17.2 secs — ABNORMAL HIGH (ref 11.5–14.7)

## 2012-12-28 LAB — MAGNESIUM: Magnesium: 2.1 mg/dL

## 2012-12-29 LAB — COMPREHENSIVE METABOLIC PANEL
Alkaline Phosphatase: 121 U/L (ref 50–136)
Anion Gap: 6 — ABNORMAL LOW (ref 7–16)
BUN: 53 mg/dL — ABNORMAL HIGH (ref 7–18)
Bilirubin,Total: 0.9 mg/dL (ref 0.2–1.0)
Chloride: 99 mmol/L (ref 98–107)
Co2: 35 mmol/L — ABNORMAL HIGH (ref 21–32)
Creatinine: 1.87 mg/dL — ABNORMAL HIGH (ref 0.60–1.30)
EGFR (African American): 36 — ABNORMAL LOW
EGFR (Non-African Amer.): 31 — ABNORMAL LOW
Osmolality: 294 (ref 275–301)
Potassium: 3.5 mmol/L (ref 3.5–5.1)
SGPT (ALT): 18 U/L (ref 12–78)
Sodium: 140 mmol/L (ref 136–145)
Total Protein: 6.6 g/dL (ref 6.4–8.2)

## 2012-12-29 LAB — CBC WITH DIFFERENTIAL/PLATELET
Basophil #: 0 10*3/uL (ref 0.0–0.1)
Basophil %: 0.2 %
HCT: 31.3 % — ABNORMAL LOW (ref 40.0–52.0)
HGB: 10.5 g/dL — ABNORMAL LOW (ref 13.0–18.0)
Lymphocyte #: 0.9 10*3/uL — ABNORMAL LOW (ref 1.0–3.6)
Lymphocyte %: 4.3 %
MCV: 92 fL (ref 80–100)
Monocyte %: 5.6 %
Neutrophil #: 18.8 10*3/uL — ABNORMAL HIGH (ref 1.4–6.5)
Platelet: 192 10*3/uL (ref 150–440)
RBC: 3.39 10*6/uL — ABNORMAL LOW (ref 4.40–5.90)
WBC: 20.9 10*3/uL — ABNORMAL HIGH (ref 3.8–10.6)

## 2012-12-29 LAB — CK TOTAL AND CKMB (NOT AT ARMC)
CK, Total: 36 U/L (ref 35–232)
CK-MB: 2 ng/mL (ref 0.5–3.6)
CK-MB: 2.3 ng/mL (ref 0.5–3.6)

## 2012-12-29 LAB — TROPONIN I: Troponin-I: 0.32 ng/mL — ABNORMAL HIGH

## 2012-12-30 LAB — BASIC METABOLIC PANEL
Anion Gap: 8 (ref 7–16)
BUN: 52 mg/dL — ABNORMAL HIGH (ref 7–18)
Chloride: 98 mmol/L (ref 98–107)
Creatinine: 1.75 mg/dL — ABNORMAL HIGH (ref 0.60–1.30)
EGFR (Non-African Amer.): 34 — ABNORMAL LOW
Glucose: 101 mg/dL — ABNORMAL HIGH (ref 65–99)
Osmolality: 292 (ref 275–301)
Sodium: 139 mmol/L (ref 136–145)

## 2012-12-30 LAB — CBC WITH DIFFERENTIAL/PLATELET
Basophil #: 0 10*3/uL (ref 0.0–0.1)
HCT: 31.3 % — ABNORMAL LOW (ref 40.0–52.0)
Lymphocyte #: 0.5 10*3/uL — ABNORMAL LOW (ref 1.0–3.6)
MCH: 30.6 pg (ref 26.0–34.0)
Monocyte #: 1.1 x10 3/mm — ABNORMAL HIGH (ref 0.2–1.0)
Monocyte %: 6.6 %
Neutrophil %: 89.8 %
RBC: 3.41 10*6/uL — ABNORMAL LOW (ref 4.40–5.90)
RDW: 15.2 % — ABNORMAL HIGH (ref 11.5–14.5)
WBC: 17.2 10*3/uL — ABNORMAL HIGH (ref 3.8–10.6)

## 2012-12-31 LAB — CBC WITH DIFFERENTIAL/PLATELET
Basophil #: 0 10*3/uL (ref 0.0–0.1)
Basophil %: 0.2 %
Eosinophil #: 0 10*3/uL (ref 0.0–0.7)
Eosinophil %: 0.2 %
HGB: 10.5 g/dL — ABNORMAL LOW (ref 13.0–18.0)
Lymphocyte #: 0.5 10*3/uL — ABNORMAL LOW (ref 1.0–3.6)
Lymphocyte %: 3.9 %
MCHC: 32.9 g/dL (ref 32.0–36.0)
MCV: 92 fL (ref 80–100)
Monocyte #: 0.8 x10 3/mm (ref 0.2–1.0)
Monocyte %: 6.2 %
Neutrophil #: 12.2 10*3/uL — ABNORMAL HIGH (ref 1.4–6.5)
Platelet: 191 10*3/uL (ref 150–440)
RBC: 3.5 10*6/uL — ABNORMAL LOW (ref 4.40–5.90)
RDW: 15 % — ABNORMAL HIGH (ref 11.5–14.5)
WBC: 13.6 10*3/uL — ABNORMAL HIGH (ref 3.8–10.6)

## 2012-12-31 LAB — BASIC METABOLIC PANEL
Anion Gap: 6 — ABNORMAL LOW (ref 7–16)
Chloride: 98 mmol/L (ref 98–107)
Co2: 33 mmol/L — ABNORMAL HIGH (ref 21–32)
EGFR (African American): 45 — ABNORMAL LOW
EGFR (Non-African Amer.): 39 — ABNORMAL LOW
Glucose: 107 mg/dL — ABNORMAL HIGH (ref 65–99)
Osmolality: 286 (ref 275–301)
Sodium: 137 mmol/L (ref 136–145)

## 2013-01-01 DIAGNOSIS — I472 Ventricular tachycardia: Secondary | ICD-10-CM

## 2013-01-01 DIAGNOSIS — I4891 Unspecified atrial fibrillation: Secondary | ICD-10-CM

## 2013-01-01 LAB — BASIC METABOLIC PANEL
Co2: 34 mmol/L — ABNORMAL HIGH (ref 21–32)
Glucose: 115 mg/dL — ABNORMAL HIGH (ref 65–99)
Osmolality: 285 (ref 275–301)
Potassium: 4.4 mmol/L (ref 3.5–5.1)

## 2013-01-01 LAB — CBC WITH DIFFERENTIAL/PLATELET
Basophil #: 0 10*3/uL (ref 0.0–0.1)
Basophil %: 0.3 %
Eosinophil %: 0.4 %
HCT: 32.5 % — ABNORMAL LOW (ref 40.0–52.0)
HGB: 10.8 g/dL — ABNORMAL LOW (ref 13.0–18.0)
MCH: 30.5 pg (ref 26.0–34.0)
Monocyte %: 8.5 %
Neutrophil #: 10.2 10*3/uL — ABNORMAL HIGH (ref 1.4–6.5)
RDW: 15.2 % — ABNORMAL HIGH (ref 11.5–14.5)
WBC: 12 10*3/uL — ABNORMAL HIGH (ref 3.8–10.6)

## 2013-01-02 LAB — EXPECTORATED SPUTUM ASSESSMENT W GRAM STAIN, RFLX TO RESP C

## 2013-01-02 LAB — AMMONIA: Ammonia, Plasma: 40 mcmol/L — ABNORMAL HIGH (ref 11–32)

## 2013-01-02 LAB — CBC WITH DIFFERENTIAL/PLATELET
Eosinophil #: 0 10*3/uL (ref 0.0–0.7)
Eosinophil %: 0.3 %
HCT: 32.5 % — ABNORMAL LOW (ref 40.0–52.0)
HGB: 10.6 g/dL — ABNORMAL LOW (ref 13.0–18.0)
Lymphocyte #: 0.5 10*3/uL — ABNORMAL LOW (ref 1.0–3.6)
MCH: 30.1 pg (ref 26.0–34.0)
MCHC: 32.6 g/dL (ref 32.0–36.0)
Monocyte %: 7.8 %
Neutrophil #: 10.7 10*3/uL — ABNORMAL HIGH (ref 1.4–6.5)
Neutrophil %: 86.8 %
RBC: 3.53 10*6/uL — ABNORMAL LOW (ref 4.40–5.90)
RDW: 14.7 % — ABNORMAL HIGH (ref 11.5–14.5)

## 2013-01-03 LAB — BASIC METABOLIC PANEL
Anion Gap: 7 (ref 7–16)
Chloride: 99 mmol/L (ref 98–107)
EGFR (African American): 46 — ABNORMAL LOW
Glucose: 110 mg/dL — ABNORMAL HIGH (ref 65–99)
Potassium: 4.2 mmol/L (ref 3.5–5.1)

## 2013-01-03 LAB — CBC WITH DIFFERENTIAL/PLATELET
Basophil %: 0.6 %
Eosinophil #: 0.1 10*3/uL (ref 0.0–0.7)
Lymphocyte #: 0.6 10*3/uL — ABNORMAL LOW (ref 1.0–3.6)
Lymphocyte %: 5.4 %
MCH: 29.9 pg (ref 26.0–34.0)
MCV: 92 fL (ref 80–100)
Monocyte #: 1 x10 3/mm (ref 0.2–1.0)
Neutrophil #: 9.7 10*3/uL — ABNORMAL HIGH (ref 1.4–6.5)
Platelet: 193 10*3/uL (ref 150–440)
RBC: 3.82 10*6/uL — ABNORMAL LOW (ref 4.40–5.90)
RDW: 15.1 % — ABNORMAL HIGH (ref 11.5–14.5)

## 2013-01-03 LAB — CULTURE, BLOOD (SINGLE)

## 2013-01-05 ENCOUNTER — Emergency Department (HOSPITAL_COMMUNITY): Payer: Medicare Other

## 2013-01-05 ENCOUNTER — Inpatient Hospital Stay (HOSPITAL_COMMUNITY)
Admission: EM | Admit: 2013-01-05 | Discharge: 2013-01-12 | DRG: 308 | Disposition: A | Discharge status: Skilled Nursing Facility | Payer: Medicare Other | Attending: Internal Medicine | Admitting: Internal Medicine

## 2013-01-05 ENCOUNTER — Encounter (HOSPITAL_COMMUNITY): Payer: Self-pay | Admitting: *Deleted

## 2013-01-05 DIAGNOSIS — G9340 Encephalopathy, unspecified: Secondary | ICD-10-CM

## 2013-01-05 DIAGNOSIS — J9610 Chronic respiratory failure, unspecified whether with hypoxia or hypercapnia: Secondary | ICD-10-CM

## 2013-01-05 DIAGNOSIS — I5032 Chronic diastolic (congestive) heart failure: Secondary | ICD-10-CM

## 2013-01-05 DIAGNOSIS — I10 Essential (primary) hypertension: Secondary | ICD-10-CM

## 2013-01-05 DIAGNOSIS — E119 Type 2 diabetes mellitus without complications: Secondary | ICD-10-CM | POA: Diagnosis present

## 2013-01-05 DIAGNOSIS — R55 Syncope and collapse: Secondary | ICD-10-CM

## 2013-01-05 DIAGNOSIS — D72829 Elevated white blood cell count, unspecified: Secondary | ICD-10-CM | POA: Diagnosis present

## 2013-01-05 DIAGNOSIS — N183 Chronic kidney disease, stage 3 unspecified: Secondary | ICD-10-CM | POA: Diagnosis present

## 2013-01-05 DIAGNOSIS — W19XXXA Unspecified fall, initial encounter: Secondary | ICD-10-CM

## 2013-01-05 DIAGNOSIS — J811 Chronic pulmonary edema: Secondary | ICD-10-CM

## 2013-01-05 DIAGNOSIS — Z87891 Personal history of nicotine dependence: Secondary | ICD-10-CM

## 2013-01-05 DIAGNOSIS — J9620 Acute and chronic respiratory failure, unspecified whether with hypoxia or hypercapnia: Secondary | ICD-10-CM

## 2013-01-05 DIAGNOSIS — E785 Hyperlipidemia, unspecified: Secondary | ICD-10-CM

## 2013-01-05 DIAGNOSIS — I4891 Unspecified atrial fibrillation: Principal | ICD-10-CM

## 2013-01-05 DIAGNOSIS — I5033 Acute on chronic diastolic (congestive) heart failure: Secondary | ICD-10-CM | POA: Diagnosis present

## 2013-01-05 DIAGNOSIS — I739 Peripheral vascular disease, unspecified: Secondary | ICD-10-CM

## 2013-01-05 DIAGNOSIS — R0609 Other forms of dyspnea: Secondary | ICD-10-CM

## 2013-01-05 DIAGNOSIS — J189 Pneumonia, unspecified organism: Secondary | ICD-10-CM

## 2013-01-05 DIAGNOSIS — J438 Other emphysema: Secondary | ICD-10-CM

## 2013-01-05 DIAGNOSIS — Z9981 Dependence on supplemental oxygen: Secondary | ICD-10-CM

## 2013-01-05 DIAGNOSIS — M79669 Pain in unspecified lower leg: Secondary | ICD-10-CM

## 2013-01-05 DIAGNOSIS — L97909 Non-pressure chronic ulcer of unspecified part of unspecified lower leg with unspecified severity: Secondary | ICD-10-CM

## 2013-01-05 DIAGNOSIS — I129 Hypertensive chronic kidney disease with stage 1 through stage 4 chronic kidney disease, or unspecified chronic kidney disease: Secondary | ICD-10-CM | POA: Diagnosis present

## 2013-01-05 DIAGNOSIS — N179 Acute kidney failure, unspecified: Secondary | ICD-10-CM

## 2013-01-05 DIAGNOSIS — I509 Heart failure, unspecified: Secondary | ICD-10-CM | POA: Diagnosis present

## 2013-01-05 HISTORY — DX: Chronic obstructive pulmonary disease, unspecified: J44.9

## 2013-01-05 HISTORY — DX: Chronic kidney disease, unspecified: N18.9

## 2013-01-05 HISTORY — DX: Unspecified atrial fibrillation: I48.91

## 2013-01-05 HISTORY — DX: Heart failure, unspecified: I50.9

## 2013-01-05 HISTORY — DX: Type 2 diabetes mellitus without complications: E11.9

## 2013-01-05 LAB — CBC WITH DIFFERENTIAL/PLATELET
Basophils Absolute: 0 10*3/uL (ref 0.0–0.1)
Eosinophil #: 0.1 10*3/uL (ref 0.0–0.7)
Eosinophil %: 0.6 %
Eosinophils Absolute: 0 10*3/uL (ref 0.0–0.7)
Eosinophils Relative: 0 % (ref 0–5)
HCT: 37.9 % — ABNORMAL LOW (ref 39.0–52.0)
HGB: 10.6 g/dL — ABNORMAL LOW (ref 13.0–18.0)
Lymphocyte #: 0.4 10*3/uL — ABNORMAL LOW (ref 1.0–3.6)
Lymphocyte %: 2.8 %
Lymphocytes Relative: 2 % — ABNORMAL LOW (ref 12–46)
MCH: 30.5 pg (ref 26.0–34.0)
MCHC: 32.9 g/dL (ref 32.0–36.0)
MCV: 92 fL (ref 80–100)
MCV: 94.8 fL (ref 78.0–100.0)
Monocyte %: 5.4 %
Monocytes Absolute: 1 10*3/uL (ref 0.1–1.0)
Neutrophil %: 90 %
Platelet: 198 10*3/uL (ref 150–440)
Platelets: 225 10*3/uL (ref 150–400)
RDW: 15.1 % — ABNORMAL HIGH (ref 11.5–14.5)
RDW: 14.9 % (ref 11.5–15.5)
WBC: 13.1 10*3/uL — ABNORMAL HIGH (ref 3.8–10.6)
WBC: 16.9 10*3/uL — ABNORMAL HIGH (ref 4.0–10.5)

## 2013-01-05 LAB — BASIC METABOLIC PANEL
BUN: 33 mg/dL — ABNORMAL HIGH (ref 7–18)
Calcium, Total: 8.5 mg/dL (ref 8.5–10.1)
Co2: 36 mmol/L — ABNORMAL HIGH (ref 21–32)
Creatinine: 1.56 mg/dL — ABNORMAL HIGH (ref 0.60–1.30)
EGFR (African American): 45 — ABNORMAL LOW
Glucose: 126 mg/dL — ABNORMAL HIGH (ref 65–99)
Potassium: 4.1 mmol/L (ref 3.5–5.1)

## 2013-01-05 LAB — COMPREHENSIVE METABOLIC PANEL
AST: 88 U/L — ABNORMAL HIGH (ref 0–37)
CO2: 35 mEq/L — ABNORMAL HIGH (ref 19–32)
Calcium: 9.4 mg/dL (ref 8.4–10.5)
Creatinine, Ser: 1.63 mg/dL — ABNORMAL HIGH (ref 0.50–1.35)
GFR calc Af Amer: 41 mL/min — ABNORMAL LOW (ref >90)
GFR calc non Af Amer: 36 mL/min — ABNORMAL LOW (ref >90)
Glucose, Bld: 131 mg/dL — ABNORMAL HIGH (ref 70–99)
Total Protein: 7.8 g/dL (ref 6.0–8.3)

## 2013-01-05 LAB — PROTIME-INR: INR: 1.32 (ref 0.00–1.49)

## 2013-01-05 LAB — CULTURE, BLOOD (SINGLE)

## 2013-01-05 MED ORDER — DILTIAZEM HCL 50 MG/10ML IV SOLN: 10.0000 mg | Freq: Once | INTRAVENOUS | Status: DC

## 2013-01-05 MED ORDER — ONDANSETRON HCL 4 MG/2ML IJ SOLN: 4.0000 mg | Freq: Three times a day (TID) | INTRAMUSCULAR | Status: DC | PRN

## 2013-01-05 MED ORDER — DILTIAZEM HCL 100 MG IV SOLR
5.0000 mg/h | INTRAVENOUS | Status: DC
  Administered 2013-01-05: 5 mg/h via INTRAVENOUS
  Filled 2013-01-05: qty 100

## 2013-01-05 MED ORDER — LEVOFLOXACIN IN D5W 500 MG/100ML IV SOLN
500.0000 mg | Freq: Once | INTRAVENOUS | Status: AC
  Administered 2013-01-05: 500 mg via INTRAVENOUS
  Filled 2013-01-05: qty 100

## 2013-01-05 MED ORDER — DILTIAZEM LOAD VIA INFUSION
10.0000 mg | Freq: Once | INTRAVENOUS | Status: AC
  Administered 2013-01-05: 10 mg via INTRAVENOUS
  Filled 2013-01-05: qty 10

## 2013-01-05 NOTE — ED Provider Notes (Signed)
History  This chart was scribed for Glynn Octave, MD by Shari Heritage, ED Scribe. The patient was seen in room A05C/A05C. Patient's care was started at 2059.   CSN: [id number]  Arrival date & time 01/05/13  2048   First MD Initiated Contact with Patient 01/05/13 2059      No chief complaint on file.    The history is provided by the patient. No language interpreter was used.    HPI Comments: Garrett Reese is a 79 y.o. male who presents to the Emergency Department complaining of sudden fall while he was walking up a ramp to his door less than 1 hour ago. There is an associated abrasion to left knee. He denies dizziness or syncope prior to the fall.  Patient denies any pain at this time. Upon arrival at patient's home, EMS noted that patient appeared clammy and pale and was cold to the touch. Patient says he did not hit his head after falling. Patient was discharged today from [facility] today where he was being treated for pneumonia. Patient is currently taking Coumadin. Patient has a history of COPD and CHF. He uses oxygen at home.    Past Medical History  Diagnosis Date  . Dyslipidemia   . Hypertension   . Lung cancer     History reviewed. No pertinent past surgical history.  No family history on file.  History  Substance Use Topics  . Smoking status: Former Smoker -- 2.0 packs/day for 45 years    Types: Cigarettes    Quit date: 12/09/1979  . Smokeless tobacco: Not on file  . Alcohol Use: Not on file      Review of Systems A complete 10 system review of systems was obtained and all systems are negative except as noted in the HPI and PMH.   Allergies  Review of patient's allergies indicates no known allergies.  Home Medications   Current Outpatient Rx  Name  Route  Sig  Dispense  Refill  . ALBUTEROL SULFATE HFA 108 (90 BASE) MCG/ACT IN AERS   Inhalation   Inhale 2 puffs into the lungs every 6 (six) hours as needed.   1 Inhaler   3   . ASPIRIN 325 MG PO  TABS   Oral   Take 325 mg by mouth daily.         . BUDESONIDE-FORMOTEROL FUMARATE 160-4.5 MCG/ACT IN AERO   Inhalation   Inhale 2 puffs into the lungs 2 (two) times daily.           Marland Kitchen FEXOFENADINE HCL 180 MG PO TABS   Oral   Take 180 mg by mouth daily as needed.           . FUROSEMIDE 40 MG PO TABS   Oral   Take 40 mg by mouth 4 (four) times daily.          Marland Kitchen GLIPIZIDE ER 2.5 MG PO TB24   Oral   Take 5 mg by mouth daily.         . GUAIFENESIN ER 600 MG PO TB12   Oral   Take 600 mg by mouth 2 (two) times daily.           Marland Kitchen METOPROLOL SUCCINATE ER 50 MG PO TB24   Oral   Take 75 mg by mouth daily.          Marland Kitchen METRONIDAZOLE 0.75 % EX CREA   Topical   Apply 1 application topically 2 (two) times daily.         Marland Kitchen  OXYGEN-HELIUM IN   Inhalation   Inhale 3 L/min into the lungs daily.         Marland Kitchen TIOTROPIUM BROMIDE MONOHYDRATE 18 MCG IN CAPS   Inhalation   Place 18 mcg into inhaler and inhale daily.             Triage Vitrals: BP 107/56  Temp 97.8 F (36.6 C)  Resp 18  SpO2 98%  Physical Exam  Constitutional: He is oriented to person, place, and time. He appears well-developed and well-nourished. No distress.  HENT:  Head: Normocephalic and atraumatic.  Eyes: Conjunctivae normal and EOM are normal. Pupils are equal, round, and reactive to light.  Neck: Neck supple.  Cardiovascular: Normal rate, normal heart sounds and intact distal pulses.   No murmur heard.      Irregular tachycardia  Pulmonary/Chest: Effort normal. No respiratory distress. He has decreased breath sounds.  Abdominal: Soft. There is no tenderness.       Old ecchymosis to right abdomen.  Musculoskeletal: He exhibits no edema.       Wound to R tibia with gauze stuck to wound. Family states present for weeks.  Neurological: He is alert and oriented to person, place, and time.       5/5 strength throughout.  Skin: Skin is warm and dry. No rash noted.       Skin tears to bilateral  shins and left knee.   Psychiatric: He has a normal mood and affect. His behavior is normal.    ED Course  Procedures (including critical care time) DIAGNOSTIC STUDIES: Oxygen Saturation is 98% on room air, normal by my interpretation.    COORDINATION OF CARE: 9:07 PM- Patient informed of current plan for treatment and evaluation and agrees with plan at this time.   Results for orders placed during the hospital encounter of 01/05/13  CBC WITH DIFFERENTIAL      Component Value Range   WBC 16.9 (*) 4.0 - 10.5 K/uL   RBC 4.00 (*) 4.22 - 5.81 MIL/uL   Hemoglobin 12.2 (*) 13.0 - 17.0 g/dL   HCT 78.2 (*) 95.6 - 21.3 %   MCV 94.8  78.0 - 100.0 fL   MCH 30.5  26.0 - 34.0 pg   MCHC 32.2  30.0 - 36.0 g/dL   RDW 08.6  57.8 - 46.9 %   Platelets 225  150 - 400 K/uL   Neutrophils Relative 92 (*) 43 - 77 %   Neutro Abs 15.6 (*) 1.7 - 7.7 K/uL   Lymphocytes Relative 2 (*) 12 - 46 %   Lymphs Abs 0.3 (*) 0.7 - 4.0 K/uL   Monocytes Relative 6  3 - 12 %   Monocytes Absolute 1.0  0.1 - 1.0 K/uL   Eosinophils Relative 0  0 - 5 %   Eosinophils Absolute 0.0  0.0 - 0.7 K/uL   Basophils Relative 0  0 - 1 %   Basophils Absolute 0.0  0.0 - 0.1 K/uL  COMPREHENSIVE METABOLIC PANEL      Component Value Range   Sodium 141  135 - 145 mEq/L   Potassium 5.1  3.5 - 5.1 mEq/L   Chloride 95 (*) 96 - 112 mEq/L   CO2 35 (*) 19 - 32 mEq/L   Glucose, Bld 131 (*) 70 - 99 mg/dL   BUN 36 (*) 6 - 23 mg/dL   Creatinine, Ser 6.29 (*) 0.50 - 1.35 mg/dL   Calcium 9.4  8.4 - 52.8 mg/dL   Total Protein  7.8  6.0 - 8.3 g/dL   Albumin 2.8 (*) 3.5 - 5.2 g/dL   AST 88 (*) 0 - 37 U/L   ALT 42  0 - 53 U/L   Alkaline Phosphatase 230 (*) 39 - 117 U/L   Total Bilirubin 1.0  0.3 - 1.2 mg/dL   GFR calc non Af Amer 36 (*) >90 mL/min   GFR calc Af Amer 41 (*) >90 mL/min  PROTIME-INR      Component Value Range   Prothrombin Time 16.1 (*) 11.6 - 15.2 seconds   INR 1.32  0.00 - 1.49     Dg Chest 2 View  01/05/2013   *RADIOLOGY REPORT*  Clinical Data: Fall, chest pain.  CHEST - 2 VIEW  Comparison: 12/25/2011  Findings: Cardiomegaly.  Bibasilar opacities have increased in the interval.  In addition, increased interstitial markings.  Small effusions not excluded.  No pneumothorax.  Mediastinal contours unchanged.  No acute osseous finding.  IMPRESSION: Increased bibasilar opacities and interstitial markings.  May represent pneumonia or edema.   Original Report Authenticated By: Jearld Lesch, M.D.    Dg Tibia/fibula Left  01/05/2013  *RADIOLOGY REPORT*  Clinical Data: Fall, leg abrasions.  LEFT TIBIA AND FIBULA - 2 VIEW  Comparison: Contralateral tibia / fibula  Findings: Left tibia / fibula radiographs show no displaced fracture.  Partially imaged degenerative changes of the knee joint. These are not optimized evaluate the joint spaces.  Enthesopathic changes at the tibial tubercle.  Atherosclerotic vascular calcifications.  IMPRESSION: No acute osseous abnormality of the left tibia / fibula.   Original Report Authenticated By: Jearld Lesch, M.D.    Dg Tibia/fibula Right  01/05/2013  *RADIOLOGY REPORT*  Clinical Data: Fall, multiple abrasions/bruising to the right and left lower legs.  RIGHT TIBIA AND FIBULA - 2 VIEW  Comparison: Contralateral tibia / fibula  Findings: Right tibia / fibula radiographs show no displaced fracture.  Partially imaged degenerative changes of the knee joint. These views are not optimized to evaluate the joint spaces. Atherosclerotic vascular calcifications.  IMPRESSION:  No acute osseous abnormality of the right tibia / fibula.   Original Report Authenticated By: Jearld Lesch, M.D.    Ct Head Wo Contrast  01/05/2013  *RADIOLOGY REPORT*  Clinical Data:  Fall, on anticoagulation  CT HEAD WITHOUT CONTRAST CT CERVICAL SPINE WITHOUT CONTRAST  Technique:  Multidetector CT imaging of the head and cervical spine was performed following the standard protocol without intravenous contrast.   Multiplanar CT image reconstructions of the cervical spine were also generated.  Comparison:   None  CT HEAD  Findings: Prominence of the sulci, cisterns, and ventricles, in keeping with volume loss. There are subcortical and periventricular white matter hypodensities, a nonspecific finding most often seen with chronic microangiopathic changes.  There is no evidence for acute hemorrhage, overt hydrocephalus, mass lesion, or abnormal extra-axial fluid collection.  No definite CT evidence for acute cortical based (large artery) infarction. The visualized paranasal sinuses and mastoid air cells are predominately clear. Scattered atherosclerosis.  IMPRESSION: Volume loss and white matter changes, not unexpected for age.  No CT evidence of acute intracranial abnormality.  CT CERVICAL SPINE  Findings: Centrolobular emphysematous changes of the lung apices. Scattered atherosclerotic vascular calcifications.  Maintained craniocervical relationship.  No dens fracture.  Maintained vertebral body height and alignment.  Multilevel degenerative changes with uncovertebral joint hypertrophy, facet arthropathy, and disc osteophyte complexes resulting in mild central canal narrowing.  Paravertebral soft tissues within normal limits.  IMPRESSION:  Multilevel degenerative changes.  No acute osseous abnormality of the cervical spine.   Original Report Authenticated By: Jearld Lesch, M.D.    Ct Cervical Spine Wo Contrast  01/05/2013  *RADIOLOGY REPORT*  Clinical Data:  Fall, on anticoagulation  CT HEAD WITHOUT CONTRAST CT CERVICAL SPINE WITHOUT CONTRAST  Technique:  Multidetector CT imaging of the head and cervical spine was performed following the standard protocol without intravenous contrast.  Multiplanar CT image reconstructions of the cervical spine were also generated.  Comparison:   None  CT HEAD  Findings: Prominence of the sulci, cisterns, and ventricles, in keeping with volume loss. There are subcortical and  periventricular white matter hypodensities, a nonspecific finding most often seen with chronic microangiopathic changes.  There is no evidence for acute hemorrhage, overt hydrocephalus, mass lesion, or abnormal extra-axial fluid collection.  No definite CT evidence for acute cortical based (large artery) infarction. The visualized paranasal sinuses and mastoid air cells are predominately clear. Scattered atherosclerosis.  IMPRESSION: Volume loss and white matter changes, not unexpected for age.  No CT evidence of acute intracranial abnormality.  CT CERVICAL SPINE  Findings: Centrolobular emphysematous changes of the lung apices. Scattered atherosclerotic vascular calcifications.  Maintained craniocervical relationship.  No dens fracture.  Maintained vertebral body height and alignment.  Multilevel degenerative changes with uncovertebral joint hypertrophy, facet arthropathy, and disc osteophyte complexes resulting in mild central canal narrowing.  Paravertebral soft tissues within normal limits.  IMPRESSION: Multilevel degenerative changes.  No acute osseous abnormality of the cervical spine.   Original Report Authenticated By: Jearld Lesch, M.D.      No diagnosis found.    MDM  Syncopal episode while walking into house today.  Just discharged from [facility] after 1 week stay for pneumonia.  Does not recall episode. NO chest pain, SOB, dizziness, lightheadedness.  Denies falling.  Afib with RVR on arrival.  Family reports multiple falls recently. State workup for stroke was negative at [facility]. Records requested.  CT head and C  Spine negative. Apparent airspace disease on CXR. Concern for cardiac etiology of syncope given lack of prodrome.  Cardizem gtt begun for rate control. Obtain records from [place].   Date: 01/06/2013  Rate: 122  Rhythm: atrial fibrillation  QRS Axis: normal  Intervals: normal  ST/T Wave abnormalities: nonspecific ST/T changes  Conduction Disutrbances:none   Narrative Interpretation: septal q waves  Old EKG Reviewed: none available     I personally performed the services described in this documentation, which was scribed in my presence. The recorded information has been reviewed and is accurate.   Glynn Octave, MD 01/06/13 1126

## 2013-01-05 NOTE — ED Notes (Signed)
Spoke with IV team, they're on the way.   

## 2013-01-05 NOTE — ED Notes (Signed)
Unable to obtain IV access, IV team paged.

## 2013-01-05 NOTE — H&P (Signed)
History and Physical  Garrett Reese NWG:[id number] DOB: 1921-10-09 DOA: 01/05/2013  Referring physician: Dr. Manus Gunning PCP: Benita Stabile, MD  [facility]: [facility] Dr. Shelle Iron  Chief Complaint: Fall  HPI:  79 year old man presented with history of fall upon getting out of his car 1/29. Initial workup in the emergency room was notable for atrial fibrillation with rapid ventricular response on arrival, acute renal failure, leukocytosis and pneumonia the patient was referred for admission. Syncope was considered by emergency department physician.  History obtained from patient and daughter at bedside. Patient was just discharged from [facility] 1/29, upon getting home he got out of the car and immediately fell down. He denies loss of consciousness or injury. He states he simply fell and he denied syncope. Upon arrival at patient's home, EMS noted that patient appeared clammy and pale and was cold to the touch. No discharge summary currently available, however patient has discharge instructions which list final diagnoses including Sirs, right lower extremity cellulitis, pneumonia, acute on chronic diastolic heart failure, anasarca, acute on chronic renal failure, leukocytosis, atrial fibrillation with rapid ventricular response, elevated troponin, possible type II MI, ventricular tachycardia, SVT, hyperammonemia, questionable hepatic encephalopathy. By report he was at [facility] for 8 days and MRI of the brain was unremarkable, no records available..  Further history is notable for at least 11 falls since July of last year, with legs just "giving out". Family is also noted confusion and the patient saying strange things for at least several weeks now.  In the emergency department noted to be afebrile, borderline hypotension with one reading of 96/51, tachycardic on presentation. Normal pulse. Hypoxic. Chest x-ray showed pneumonia. CT head and neck unremarkable.  Creatinine 1.63, BUN 36, alkaline phosphatase 230, AST 88. White blood cell count 16.9. Treated with IV diltiazem bolus and infusion, Levaquin. EKG atrial fibrillation with rapid ventricular response. Nonspecific ST changes.  Review of Systems:  Negative for fever, visual changes, sore throat, rash, chest pain, dysuria, bleeding, n/v/abdominal pain.  Thigh pain bilateral a few weeks, chronic SOB same, lesions bilateral lower extremities, right present for several weeks and occurred spontaneously, the left occurred after  fall prior to hospitalization.  Past Medical History  Diagnosis Date  . Dyslipidemia   . Hypertension   . Lung cancer   . DM type 2 (diabetes mellitus, type 2)   . COPD (chronic obstructive pulmonary disease)   . Atrial fibrillation   . CHF (congestive heart failure)   . CKD (chronic kidney disease)     Past Surgical History  Procedure Date  . Cholecystectomy   . Lung removal, partial     1991    Social History:  reports that he quit smoking about 33 years ago. His smoking use included Cigarettes. He has a 90 pack-year smoking history. He does not have any smokeless tobacco history on file. His alcohol and drug histories not on file.  Allergies  Allergen Reactions  . Captopril     Family History  Problem Relation Age of Onset  . Heart attack Father      Prior to Admission medications   Medication Sig Start Date End Date Taking? Authorizing Provider  albuterol (PROVENTIL HFA;VENTOLIN HFA) 108 (90 BASE) MCG/ACT inhaler Inhale 2 puffs into the lungs every 6 (six) hours as needed. 12/25/11  Yes Tammy S Parrett, NP  amoxicillin-clavulanate (AUGMENTIN) 500-125 MG per tablet Take 1 tablet by mouth 2 (two) times daily.   Yes Historical Provider, MD  aspirin 81 MG chewable  tablet Chew 81 mg by mouth daily.   Yes Historical Provider, MD  atorvastatin (LIPITOR) 20 MG tablet Take 20 mg by mouth daily.   Yes Historical Provider, MD  budesonide-formoterol (SYMBICORT)  160-4.5 MCG/ACT inhaler Inhale 2 puffs into the lungs 2 (two) times daily.     Yes Historical Provider, MD  fexofenadine (ALLEGRA) 180 MG tablet Take 180 mg by mouth daily as needed.     Yes Historical Provider, MD  glipiZIDE (GLUCOTROL XL) 2.5 MG 24 hr tablet Take 5 mg by mouth daily.   Yes Historical Provider, MD  guaiFENesin (MUCINEX) 600 MG 12 hr tablet Take 600 mg by mouth 2 (two) times daily.     Yes Historical Provider, MD  metoprolol (LOPRESSOR) 50 MG tablet Take 50 mg by mouth 2 (two) times daily.   Yes Historical Provider, MD  metoprolol (TOPROL-XL) 50 MG 24 hr tablet Take 75 mg by mouth daily.    Yes Historical Provider, MD  metroNIDAZOLE (METROCREAM) 0.75 % cream Apply 1 application topically 2 (two) times daily.   Yes Historical Provider, MD  OXYGEN-HELIUM IN Inhale 3 L/min into the lungs daily.   Yes Historical Provider, MD  potassium chloride (K-DUR) 10 MEQ tablet Take 10 mEq by mouth daily.   Yes Historical Provider, MD  tiotropium (SPIRIVA) 18 MCG inhalation capsule Place 18 mcg into inhaler and inhale daily.     Yes Historical Provider, MD  furosemide (LASIX) 40 MG tablet Take 40 mg by mouth 4 (four) times daily.     Historical Provider, MD   Physical Exam: Filed Vitals:   01/05/13 2052 01/05/13 2104 01/05/13 2326 01/05/13 2335  BP:  107/56 96/51 113/53  Pulse:   75 87  Temp:  97.8 F (36.6 C)    Resp:  18 16 18   SpO2: 98%  99% 100%    General:  Examined in the emergency department. Appears calm and comfortable Eyes: PERRL, normal lids, irises  ENT: grossly normal hearing, lips Neck: no LAD, masses or thyromegaly Cardiovascular: Irregular, normal rate, no m/r/g. No LE edema. Respiratory: CTA bilaterally, no w/r/r. Normal respiratory effort. Abdomen: soft, ntnd Skin: Right lower extremity large ulcer anterior tibia, there is a gauze bandage of some kind that is stuck to the wound, per family this wound has been present several weeks. Wound could not be examined  secondary to stop bandage. There is like wound on the left leg in which appears to be a shallow ulcer. Multiple skin tears are also seen. Musculoskeletal: grossly normal tone BUE/BLE Psychiatric: grossly normal mood and affect, speech fluent and appropriate. Oriented to hospital, president, not month or year Neurologic: grossly non-focal.    Wt Readings from Last 3 Encounters:  11/12/12 92.08 kg (203 lb)  09/24/12 96.525 kg (212 lb 12.8 oz)  05/26/12 102.967 kg (227 lb)    Labs on Admission:  Basic Metabolic Panel:  Lab 01/05/13 1610  NA 141  K 5.1  CL 95*  CO2 35*  GLUCOSE 131*  BUN 36*  CREATININE 1.63*  CALCIUM 9.4  MG --  PHOS --    Liver Function Tests:  Lab 01/05/13 2121  AST 88*  ALT 42  ALKPHOS 230*  BILITOT 1.0  PROT 7.8  ALBUMIN 2.8*    CBC:  Lab 01/05/13 2121  WBC 16.9*  NEUTROABS 15.6*  HGB 12.2*  HCT 37.9*  MCV 94.8  PLT 225    Radiological Exams on Admission: Dg Chest 2 View  01/05/2013  *RADIOLOGY REPORT*  Clinical  Data: Fall, chest pain.  CHEST - 2 VIEW  Comparison: 12/25/2011  Findings: Cardiomegaly.  Bibasilar opacities have increased in the interval.  In addition, increased interstitial markings.  Small effusions not excluded.  No pneumothorax.  Mediastinal contours unchanged.  No acute osseous finding.  IMPRESSION: Increased bibasilar opacities and interstitial markings.  May represent pneumonia or edema.   Original Report Authenticated By: Jearld Lesch, M.D.    Dg Tibia/fibula Left  01/05/2013  *RADIOLOGY REPORT*  Clinical Data: Fall, leg abrasions.  LEFT TIBIA AND FIBULA - 2 VIEW  Comparison: Contralateral tibia / fibula  Findings: Left tibia / fibula radiographs show no displaced fracture.  Partially imaged degenerative changes of the knee joint. These are not optimized evaluate the joint spaces.  Enthesopathic changes at the tibial tubercle.  Atherosclerotic vascular calcifications.  IMPRESSION: No acute osseous abnormality of the  left tibia / fibula.   Original Report Authenticated By: Jearld Lesch, M.D.    Dg Tibia/fibula Right  01/05/2013  *RADIOLOGY REPORT*  Clinical Data: Fall, multiple abrasions/bruising to the right and left lower legs.  RIGHT TIBIA AND FIBULA - 2 VIEW  Comparison: Contralateral tibia / fibula  Findings: Right tibia / fibula radiographs show no displaced fracture.  Partially imaged degenerative changes of the knee joint. These views are not optimized to evaluate the joint spaces. Atherosclerotic vascular calcifications.  IMPRESSION:  No acute osseous abnormality of the right tibia / fibula.   Original Report Authenticated By: Jearld Lesch, M.D.    Ct Head Wo Contrast  01/05/2013  *RADIOLOGY REPORT*  Clinical Data:  Fall, on anticoagulation  CT HEAD WITHOUT CONTRAST CT CERVICAL SPINE WITHOUT CONTRAST  Technique:  Multidetector CT imaging of the head and cervical spine was performed following the standard protocol without intravenous contrast.  Multiplanar CT image reconstructions of the cervical spine were also generated.  Comparison:   None  CT HEAD  Findings: Prominence of the sulci, cisterns, and ventricles, in keeping with volume loss. There are subcortical and periventricular white matter hypodensities, a nonspecific finding most often seen with chronic microangiopathic changes.  There is no evidence for acute hemorrhage, overt hydrocephalus, mass lesion, or abnormal extra-axial fluid collection.  No definite CT evidence for acute cortical based (large artery) infarction. The visualized paranasal sinuses and mastoid air cells are predominately clear. Scattered atherosclerosis.  IMPRESSION: Volume loss and white matter changes, not unexpected for age.  No CT evidence of acute intracranial abnormality.  CT CERVICAL SPINE  Findings: Centrolobular emphysematous changes of the lung apices. Scattered atherosclerotic vascular calcifications.  Maintained craniocervical relationship.  No dens fracture.   Maintained vertebral body height and alignment.  Multilevel degenerative changes with uncovertebral joint hypertrophy, facet arthropathy, and disc osteophyte complexes resulting in mild central canal narrowing.  Paravertebral soft tissues within normal limits.  IMPRESSION: Multilevel degenerative changes.  No acute osseous abnormality of the cervical spine.   Original Report Authenticated By: Jearld Lesch, M.D.    Ct Cervical Spine Wo Contrast  01/05/2013  *RADIOLOGY REPORT*  Clinical Data:  Fall, on anticoagulation  CT HEAD WITHOUT CONTRAST CT CERVICAL SPINE WITHOUT CONTRAST  Technique:  Multidetector CT imaging of the head and cervical spine was performed following the standard protocol without intravenous contrast.  Multiplanar CT image reconstructions of the cervical spine were also generated.  Comparison:   None  CT HEAD  Findings: Prominence of the sulci, cisterns, and ventricles, in keeping with volume loss. There are subcortical and periventricular white matter hypodensities, a nonspecific  finding most often seen with chronic microangiopathic changes.  There is no evidence for acute hemorrhage, overt hydrocephalus, mass lesion, or abnormal extra-axial fluid collection.  No definite CT evidence for acute cortical based (large artery) infarction. The visualized paranasal sinuses and mastoid air cells are predominately clear. Scattered atherosclerosis.  IMPRESSION: Volume loss and white matter changes, not unexpected for age.  No CT evidence of acute intracranial abnormality.  CT CERVICAL SPINE  Findings: Centrolobular emphysematous changes of the lung apices. Scattered atherosclerotic vascular calcifications.  Maintained craniocervical relationship.  No dens fracture.  Maintained vertebral body height and alignment.  Multilevel degenerative changes with uncovertebral joint hypertrophy, facet arthropathy, and disc osteophyte complexes resulting in mild central canal narrowing.  Paravertebral soft tissues  within normal limits.  IMPRESSION: Multilevel degenerative changes.  No acute osseous abnormality of the cervical spine.   Original Report Authenticated By: Jearld Lesch, M.D.     EKG: Independently reviewed. As above.   Active Problems:  * No active hospital problems. *    Assessment/Plan 1. Fall, possible syncope: Syncope doubted by history. Many falls. PT evaluation. Check 2-D echocardiogram if not recently done at [facility]. 2. Atrial fibrillation with RVR: Resolved with diltiazem in the emergency department. Continue diltiazem. Check TSH. 3. Possible Pneumonia: Appears stable without tachypnea or increased oxygen dependence, however has leukocytosis. Empiric antibiotics, low threshold to narrow therapy rapidly. Doubt MRSA. 4. Acute renal failure: Baseline unknown. Gentle IV fluids. Hold Lasix. 5. Wounds bilateral lower extremities: Suspect arterial insufficiency. Check ABI. Wound care consult. No evidence of gangrene. 6. COPD/chronic respiratory failure oxygen-dependent: Stable. Continue oxygen. Continues Spiriva, Symbicort, albuterol as needed. 7. Subacute encephalopathy: Etiology unclear. Need further records from [place] prior to initiating further workup as encephalopathy appears mild at this point.   Code Status: Full code Family Communication: Discussed with daughter at bedside Disposition Plan/Anticipated LOS: Admit, 2-4 days  Time spent: 60 minutes  Brendia Sacks, MD  [facility] Pager [phone] 01/05/2013, 11:40 PM

## 2013-01-05 NOTE — ED Notes (Signed)
Per EMS:  Pt was coming home from hospital (pneumonia) pt passed out while walking up ramp to door, pt fell to his knees.  Pt was unconscious for 10 min family reports (pt didn't hit his head).  Upon EMS' arrival pt was alert and oriented x 4, pt does not remember the syncopal episode.  Reps even and unlabored, some congested cough, no respiratory complaints.  Pt alert and oriented x 4, abrasion and lacerations present to both knees and both legs, wrapped by EMS.

## 2013-01-06 ENCOUNTER — Encounter (HOSPITAL_COMMUNITY): Payer: Self-pay | Admitting: Family Medicine

## 2013-01-06 DIAGNOSIS — W19XXXA Unspecified fall, initial encounter: Secondary | ICD-10-CM | POA: Diagnosis present

## 2013-01-06 DIAGNOSIS — J9610 Chronic respiratory failure, unspecified whether with hypoxia or hypercapnia: Secondary | ICD-10-CM

## 2013-01-06 DIAGNOSIS — J189 Pneumonia, unspecified organism: Secondary | ICD-10-CM | POA: Diagnosis present

## 2013-01-06 DIAGNOSIS — N179 Acute kidney failure, unspecified: Secondary | ICD-10-CM | POA: Diagnosis present

## 2013-01-06 DIAGNOSIS — R55 Syncope and collapse: Secondary | ICD-10-CM

## 2013-01-06 DIAGNOSIS — I5032 Chronic diastolic (congestive) heart failure: Secondary | ICD-10-CM | POA: Diagnosis present

## 2013-01-06 DIAGNOSIS — G9340 Encephalopathy, unspecified: Secondary | ICD-10-CM | POA: Diagnosis present

## 2013-01-06 DIAGNOSIS — I4891 Unspecified atrial fibrillation: Secondary | ICD-10-CM | POA: Diagnosis present

## 2013-01-06 DIAGNOSIS — L97909 Non-pressure chronic ulcer of unspecified part of unspecified lower leg with unspecified severity: Secondary | ICD-10-CM | POA: Diagnosis present

## 2013-01-06 DIAGNOSIS — I739 Peripheral vascular disease, unspecified: Secondary | ICD-10-CM | POA: Diagnosis present

## 2013-01-06 LAB — PRO B NATRIURETIC PEPTIDE: Pro B Natriuretic peptide (BNP): 12233 pg/mL — ABNORMAL HIGH (ref 0–450)

## 2013-01-06 LAB — BASIC METABOLIC PANEL
GFR calc non Af Amer: 36 mL/min — ABNORMAL LOW (ref >90)
Glucose, Bld: 125 mg/dL — ABNORMAL HIGH (ref 70–99)
Potassium: 4.5 mEq/L (ref 3.5–5.1)
Sodium: 137 mEq/L (ref 135–145)

## 2013-01-06 LAB — CBC
Hemoglobin: 10 g/dL — ABNORMAL LOW (ref 13.0–17.0)
MCH: 29.9 pg (ref 26.0–34.0)
Platelets: 179 10*3/uL (ref 150–400)
RBC: 3.34 MIL/uL — ABNORMAL LOW (ref 4.22–5.81)
WBC: 10.9 10*3/uL — ABNORMAL HIGH (ref 4.0–10.5)

## 2013-01-06 LAB — TSH: TSH: 1.009 u[IU]/mL (ref 0.350–4.500)

## 2013-01-06 LAB — TROPONIN I: Troponin I: <0.3 ng/mL (ref <0.30)

## 2013-01-06 MED ORDER — BUDESONIDE-FORMOTEROL FUMARATE 160-4.5 MCG/ACT IN AERO
2.0000 | INHALATION_SPRAY | Freq: Two times a day (BID) | RESPIRATORY_TRACT | Status: DC
  Administered 2013-01-06 – 2013-01-12 (×13): 2 via RESPIRATORY_TRACT
  Filled 2013-01-06: qty 6

## 2013-01-06 MED ORDER — LEVALBUTEROL HCL 0.63 MG/3ML IN NEBU
0.6300 mg | INHALATION_SOLUTION | Freq: Four times a day (QID) | RESPIRATORY_TRACT | Status: DC | PRN
  Filled 2013-01-06: qty 3

## 2013-01-06 MED ORDER — ALBUTEROL SULFATE HFA 108 (90 BASE) MCG/ACT IN AERS
2.0000 | INHALATION_SPRAY | Freq: Four times a day (QID) | RESPIRATORY_TRACT | Status: DC | PRN
Start: 2013-01-06 — End: 2013-01-12
  Administered 2013-01-06 – 2013-01-11 (×2): 2 via RESPIRATORY_TRACT
  Filled 2013-01-06: qty 6.7

## 2013-01-06 MED ORDER — LEVOFLOXACIN IN D5W 750 MG/150ML IV SOLN
750.0000 mg | INTRAVENOUS | Status: DC
  Filled 2013-01-06: qty 150

## 2013-01-06 MED ORDER — SODIUM CHLORIDE 0.9 % IV SOLN
INTRAVENOUS | Status: DC
  Administered 2013-01-06: 02:00:00 via INTRAVENOUS

## 2013-01-06 MED ORDER — FUROSEMIDE 40 MG PO TABS
40.0000 mg | ORAL_TABLET | Freq: Two times a day (BID) | ORAL | Status: DC
  Filled 2013-01-06 (×3): qty 1

## 2013-01-06 MED ORDER — ATORVASTATIN CALCIUM 20 MG PO TABS
20.0000 mg | ORAL_TABLET | Freq: Every day | ORAL | Status: DC
  Administered 2013-01-06 – 2013-01-12 (×7): 20 mg via ORAL
  Filled 2013-01-06 (×7): qty 1

## 2013-01-06 MED ORDER — TIOTROPIUM BROMIDE MONOHYDRATE 18 MCG IN CAPS
18.0000 ug | ORAL_CAPSULE | Freq: Every day | RESPIRATORY_TRACT | Status: DC
  Administered 2013-01-06 – 2013-01-12 (×7): 18 ug via RESPIRATORY_TRACT
  Filled 2013-01-06 (×2): qty 5

## 2013-01-06 MED ORDER — SODIUM CHLORIDE 0.9 % IJ SOLN
3.0000 mL | Freq: Two times a day (BID) | INTRAMUSCULAR | Status: DC
  Administered 2013-01-06 – 2013-01-12 (×12): 3 mL via INTRAVENOUS

## 2013-01-06 MED ORDER — GUAIFENESIN-DM 100-10 MG/5ML PO SYRP
5.0000 mL | ORAL_SOLUTION | ORAL | Status: DC | PRN
  Administered 2013-01-06: 5 mL via ORAL
  Filled 2013-01-06: qty 5

## 2013-01-06 MED ORDER — ACETAMINOPHEN 325 MG PO TABS
650.0000 mg | ORAL_TABLET | Freq: Four times a day (QID) | ORAL | Status: DC | PRN
  Administered 2013-01-06: 650 mg via ORAL
  Filled 2013-01-06: qty 2

## 2013-01-06 MED ORDER — METOPROLOL TARTRATE 50 MG PO TABS
50.0000 mg | ORAL_TABLET | Freq: Two times a day (BID) | ORAL | Status: DC
  Administered 2013-01-06 – 2013-01-12 (×13): 50 mg via ORAL
  Filled 2013-01-06 (×15): qty 1

## 2013-01-06 MED ORDER — HEPARIN SODIUM (PORCINE) 5000 UNIT/ML IJ SOLN
5000.0000 [IU] | Freq: Three times a day (TID) | INTRAMUSCULAR | Status: DC
  Administered 2013-01-06 – 2013-01-10 (×13): 5000 [IU] via SUBCUTANEOUS
  Filled 2013-01-06 (×16): qty 1

## 2013-01-06 MED ORDER — TRAMADOL HCL 50 MG PO TABS
50.0000 mg | ORAL_TABLET | Freq: Four times a day (QID) | ORAL | Status: DC | PRN
  Administered 2013-01-06 – 2013-01-10 (×5): 50 mg via ORAL
  Filled 2013-01-06 (×5): qty 1

## 2013-01-06 MED ORDER — ASPIRIN 81 MG PO CHEW
81.0000 mg | CHEWABLE_TABLET | Freq: Every day | ORAL | Status: DC
  Administered 2013-01-06 – 2013-01-12 (×7): 81 mg via ORAL
  Filled 2013-01-06 (×7): qty 1

## 2013-01-06 MED ORDER — HYDROCODONE-ACETAMINOPHEN 5-325 MG PO TABS
1.0000 | ORAL_TABLET | Freq: Four times a day (QID) | ORAL | Status: DC | PRN
  Administered 2013-01-06 – 2013-01-12 (×11): 1 via ORAL
  Filled 2013-01-06 (×11): qty 1

## 2013-01-06 MED ORDER — DEXTROSE 5 % IV SOLN
1.0000 g | Freq: Three times a day (TID) | INTRAVENOUS | Status: DC
  Administered 2013-01-06: 1 g via INTRAVENOUS
  Filled 2013-01-06 (×3): qty 1

## 2013-01-06 MED ORDER — DEXTROSE 5 % IV SOLN
2.0000 g | INTRAVENOUS | Status: DC
  Administered 2013-01-07 – 2013-01-08 (×2): 2 g via INTRAVENOUS
  Filled 2013-01-06 (×2): qty 2

## 2013-01-06 MED ORDER — LEVOFLOXACIN IN D5W 750 MG/150ML IV SOLN
750.0000 mg | INTRAVENOUS | Status: AC
  Administered 2013-01-07: 750 mg via INTRAVENOUS
  Filled 2013-01-06: qty 150

## 2013-01-06 MED ORDER — ACETAMINOPHEN 650 MG RE SUPP: 650.0000 mg | Freq: Four times a day (QID) | RECTAL | Status: DC | PRN

## 2013-01-06 MED ORDER — FUROSEMIDE 80 MG PO TABS
80.0000 mg | ORAL_TABLET | Freq: Two times a day (BID) | ORAL | Status: DC
  Administered 2013-01-06 – 2013-01-09 (×7): 80 mg via ORAL
  Filled 2013-01-06 (×9): qty 1

## 2013-01-06 NOTE — Consult Note (Addendum)
Admit date: 01/05/2013 Referring Physician  Dr. Lavera Guise, [facility] Primary Physician  Benita Stabile, M.D. Primary Cardiologist   Armanda Magic M.D.  Reason for Consultation: Heart Failure  ASSESSMENT: 1. Chronic atrial fibrillation with initial poor rate control on admission. Now controlled.  2. Prior history of chronic diastolic heart failure, LVEF 55% February 2013. Current status appears relatively stable from a volume standpoint, although BNP was markedly elevated above 12,000 on admission.  3. Diabetes mellitus  4. Chronic kidney disease, stage III  5. COPD  6. History of lung cancer status post left lung lobectomy  7. Mechanical fall prompting this admission. No clear evidence of syncope.   PLAN:  1. Continue rate control of atrial fibrillation  2. Diuresis as tolerated by renal function  3. I would recommend a conference with family to align treatment goals and objectives given his age, frailty, and multiple medical problems.  4. Dr. Mayford Knife to follow  5. Follow I/O closely.  HPI: The patient is awake and able to give history although it seems to be somewhat off target. He is mostly concerned about the "sores" on his lower extremities. He tells me that he was discharged from [facility] on the same day that he was readmitted here. Apparently he fell in an attempt to get him into his house from the car after discharge. He was brought [facility] by EMS. He also tells me that he was in [facility], hospitalized for sores on his lower extremity about 2 weeks ago. He complains of shortness of breath. He denies chest discomfort. No family or friends are currently present with him.  BNP was 1651 on 12/28/2012, with a prior BNP from 01/07/12 of 428 . A BMET on 12/10/2012, revealed a BUN of 50, creatinine of 1.6 to and potassium of 4.4. The last echocardiogram demonstrated preserved LV systolic function and parameter suggesting diastolic abnormality  (February 2013).     PMH:   Past Medical History  Diagnosis Date  . Dyslipidemia   . Hypertension   . Lung cancer   . DM type 2 (diabetes mellitus, type 2)   . COPD (chronic obstructive pulmonary disease)   . Atrial fibrillation   . CHF (congestive heart failure)   . CKD (chronic kidney disease)      PSH:   Past Surgical History  Procedure Date  . Cholecystectomy   . Lung removal, partial     1991    Allergies:  Captopril Prior to Admit Meds:   Prescriptions prior to admission  Medication Sig Dispense Refill  . albuterol (PROVENTIL HFA;VENTOLIN HFA) 108 (90 BASE) MCG/ACT inhaler Inhale 2 puffs into the lungs every 6 (six) hours as needed.  1 Inhaler  3  . amoxicillin-clavulanate (AUGMENTIN) 500-125 MG per tablet Take 1 tablet by mouth 2 (two) times daily.      Marland Kitchen aspirin 81 MG chewable tablet Chew 81 mg by mouth daily.      Marland Kitchen atorvastatin (LIPITOR) 20 MG tablet Take 20 mg by mouth daily.      . budesonide-formoterol (SYMBICORT) 160-4.5 MCG/ACT inhaler Inhale 2 puffs into the lungs 2 (two) times daily.        . fexofenadine (ALLEGRA) 180 MG tablet Take 180 mg by mouth daily as needed.        Marland Kitchen glipiZIDE (GLUCOTROL XL) 2.5 MG 24 hr tablet Take 5 mg by mouth daily.      Marland Kitchen guaiFENesin (MUCINEX) 600 MG 12 hr tablet Take 600 mg  by mouth 2 (two) times daily.        . metoprolol (LOPRESSOR) 50 MG tablet Take 50 mg by mouth 2 (two) times daily.      . metoprolol (TOPROL-XL) 50 MG 24 hr tablet Take 75 mg by mouth daily.       . metroNIDAZOLE (METROCREAM) 0.75 % cream Apply 1 application topically 2 (two) times daily.      . OXYGEN-HELIUM IN Inhale 3 L/min into the lungs daily.      . potassium chloride (K-DUR) 10 MEQ tablet Take 10 mEq by mouth daily.      Marland Kitchen tiotropium (SPIRIVA) 18 MCG inhalation capsule Place 18 mcg into inhaler and inhale daily.        . furosemide (LASIX) 40 MG tablet Take 40 mg by mouth 4 (four) times daily.        Fam HX:    Family History  Problem Relation Age  of Onset  . Heart attack Father    Social HX:    History   Social History  . Marital Status: Widowed    Spouse Name: N/A    Number of Children: N/A  . Years of Education: N/A   Occupational History  . Not on file.   Social History Main Topics  . Smoking status: Former Smoker -- 2.0 packs/day for 45 years    Types: Cigarettes    Quit date: 12/09/1979  . Smokeless tobacco: Not on file  . Alcohol Use: Not on file  . Drug Use: Not on file  . Sexually Active: Not on file   Other Topics Concern  . Not on file   Social History Narrative  . No narrative on file     Review of Systems: Appetite is poor. His feet remaining colon. His toes are blue left foot greater than right. No neurological complaints. He denies head trauma. No cough, chills, or  Physical Exam: Blood pressure 129/59, pulse 78, temperature 98.2 F (36.8 C), temperature source Oral, resp. rate 18, height 5\' 6"  (1.676 m), weight 89.359 kg (197 lb), SpO2 95.00%. Weight change:   Multiple ecchymoses and petechiae noted on arms and thorax. Lichenification of the lower extremity skin is noted. Multiple bandages and skin wraps are present below the knee bilaterally.  Patient is lying relatively comfortably in bed getting ready to get out of bed with the help of physical therapy. He appears bright. He is conversant. He is in no acute distress.  Faint left carotid bruit is heard. I am unable to appreciate jugular vein meniscus.  The lungs reveal decreased breath sounds at the bases bilaterally with faint rales in the mid lung field.  The cardiac exam reveals an apical and left axillary systolic murmur grade 2-3 of 6. Irregularly irregular rhythm is noted.  Abdomen is obese. It is nontender. Bowel sounds are present.  The extremities reveal ecchymoses, petechiae, bilateral lower extremity bandages, and cyanosis of the great toe and digits on the feet bilaterally  Neurological exam reveals some evidence of  confusion/memory loss. No obvious focal deficit is noted Labs:   Lab Results  Component Value Date   WBC 10.9* 01/06/2013   HGB 10.0* 01/06/2013   HCT 31.4* 01/06/2013   MCV 94.0 01/06/2013   PLT 179 01/06/2013    Lab 01/06/13 0636 01/05/13 2121  NA 137 --  K 4.5 --  CL 95* --  CO2 32 --  BUN 39* --  CREATININE 1.62* --  CALCIUM 8.9 --  PROT -- 7.8  BILITOT -- 1.0  ALKPHOS -- 230*  ALT -- 42  AST -- 88*  GLUCOSE 125* --   No results found for this basename: PTT   Lab Results  Component Value Date   INR 1.32 01/05/2013   INR 1.2 RATIO 03/11/2007   Lab Results  Component Value Date   TROPONINI <0.30 01/05/2013     Radiology:  Dg Chest 2 View  01/05/2013  *RADIOLOGY REPORT*  Clinical Data: Fall, chest pain.  CHEST - 2 VIEW  Comparison: 12/25/2011  Findings: Cardiomegaly.  Bibasilar opacities have increased in the interval.  In addition, increased interstitial markings.  Small effusions not excluded.  No pneumothorax.  Mediastinal contours unchanged.  No acute osseous finding.  IMPRESSION: Increased bibasilar opacities and interstitial markings.  May represent pneumonia or edema.   Original Report Authenticated By: Jearld Lesch, M.D.    Dg Tibia/fibula Left  01/05/2013  *RADIOLOGY REPORT*  Clinical Data: Fall, leg abrasions.  LEFT TIBIA AND FIBULA - 2 VIEW  Comparison: Contralateral tibia / fibula  Findings: Left tibia / fibula radiographs show no displaced fracture.  Partially imaged degenerative changes of the knee joint. These are not optimized evaluate the joint spaces.  Enthesopathic changes at the tibial tubercle.  Atherosclerotic vascular calcifications.  IMPRESSION: No acute osseous abnormality of the left tibia / fibula.   Original Report Authenticated By: Jearld Lesch, M.D.    Dg Tibia/fibula Right  01/05/2013  *RADIOLOGY REPORT*  Clinical Data: Fall, multiple abrasions/bruising to the right and left lower legs.  RIGHT TIBIA AND FIBULA - 2 VIEW  Comparison:  Contralateral tibia / fibula  Findings: Right tibia / fibula radiographs show no displaced fracture.  Partially imaged degenerative changes of the knee joint. These views are not optimized to evaluate the joint spaces. Atherosclerotic vascular calcifications.  IMPRESSION:  No acute osseous abnormality of the right tibia / fibula.   Original Report Authenticated By: Jearld Lesch, M.D.    Ct Head Wo Contrast  01/05/2013  *RADIOLOGY REPORT*  Clinical Data:  Fall, on anticoagulation  CT HEAD WITHOUT CONTRAST CT CERVICAL SPINE WITHOUT CONTRAST  Technique:  Multidetector CT imaging of the head and cervical spine was performed following the standard protocol without intravenous contrast.  Multiplanar CT image reconstructions of the cervical spine were also generated.  Comparison:   None  CT HEAD  Findings: Prominence of the sulci, cisterns, and ventricles, in keeping with volume loss. There are subcortical and periventricular white matter hypodensities, a nonspecific finding most often seen with chronic microangiopathic changes.  There is no evidence for acute hemorrhage, overt hydrocephalus, mass lesion, or abnormal extra-axial fluid collection.  No definite CT evidence for acute cortical based (large artery) infarction. The visualized paranasal sinuses and mastoid air cells are predominately clear. Scattered atherosclerosis.  IMPRESSION: Volume loss and white matter changes, not unexpected for age.  No CT evidence of acute intracranial abnormality.  CT CERVICAL SPINE  Findings: Centrolobular emphysematous changes of the lung apices. Scattered atherosclerotic vascular calcifications.  Maintained craniocervical relationship.  No dens fracture.  Maintained vertebral body height and alignment.  Multilevel degenerative changes with uncovertebral joint hypertrophy, facet arthropathy, and disc osteophyte complexes resulting in mild central canal narrowing.  Paravertebral soft tissues within normal limits.  IMPRESSION:  Multilevel degenerative changes.  No acute osseous abnormality of the cervical spine.   Original Report Authenticated By: Jearld Lesch, M.D.    Ct Cervical Spine Wo Contrast  01/05/2013  *RADIOLOGY REPORT*  Clinical Data:  Fall,  on anticoagulation  CT HEAD WITHOUT CONTRAST CT CERVICAL SPINE WITHOUT CONTRAST  Technique:  Multidetector CT imaging of the head and cervical spine was performed following the standard protocol without intravenous contrast.  Multiplanar CT image reconstructions of the cervical spine were also generated.  Comparison:   None  CT HEAD  Findings: Prominence of the sulci, cisterns, and ventricles, in keeping with volume loss. There are subcortical and periventricular white matter hypodensities, a nonspecific finding most often seen with chronic microangiopathic changes.  There is no evidence for acute hemorrhage, overt hydrocephalus, mass lesion, or abnormal extra-axial fluid collection.  No definite CT evidence for acute cortical based (large artery) infarction. The visualized paranasal sinuses and mastoid air cells are predominately clear. Scattered atherosclerosis.  IMPRESSION: Volume loss and white matter changes, not unexpected for age.  No CT evidence of acute intracranial abnormality.  CT CERVICAL SPINE  Findings: Centrolobular emphysematous changes of the lung apices. Scattered atherosclerotic vascular calcifications.  Maintained craniocervical relationship.  No dens fracture.  Maintained vertebral body height and alignment.  Multilevel degenerative changes with uncovertebral joint hypertrophy, facet arthropathy, and disc osteophyte complexes resulting in mild central canal narrowing.  Paravertebral soft tissues within normal limits.  IMPRESSION: Multilevel degenerative changes.  No acute osseous abnormality of the cervical spine.   Original Report Authenticated By: Jearld Lesch, M.D.    EKG:   Atrial fibrillation with rapid ventricular response and nonspecific T-wave  flattening. No acute ST-T wave changes are noted to  ECHOCARDIOGRAM: 01/13/2012 Conclusions: 1. Left ventricular ejection fraction estimated by 2D at 57.2% percent 2. Unable to adequately assess left ventricular function due to poor acoustic window. 3. Mild concentric left ventricular hypertrophy. 4. Moderate left atrial enlargement. 5. Trivial tricuspid regurgitation. 6. Analysis of mitral valve inflow, pulmonary vein Doppler and tissue Doppler suggests grade II diastolic dysfunction with elevated left atrial pressure.   Lesleigh Noe 01/06/2013 11:10 AM

## 2013-01-06 NOTE — Progress Notes (Signed)
VASCULAR LAB PRELIMINARY  ARTERIAL  ABI completed:  Right ABI not calculated due to patient unable to withstand cuff inflation.  Left ABI indicates moderate decrease in flow but may be falsely elevated.      RIGHT    LEFT    PRESSURE WAVEFORM  PRESSURE WAVEFORM  BRACHIAL 149 Triphasic  BRACHIAL 146 Triphasic   DP  Monophasic  DP  absent  AT   AT    PT  Biphasic  PT 100 Monophasic   PER   PER    GREAT TOE  wnl GREAT TOE  flat    RIGHT LEFT  ABI Pressures not taken due to pain with inflation of cuff 0.67 Moderate decrease   Duplex imaging:  Right:  Diffuse irregular mild to moderate plaque throughout with no areas of significant stenosis.  Waveforms triphasic or biphasic throughout.  Left:  Diffuse irregular moderate plaque throughout with significant stenosis in the mid and mid-distal femoral artery.  Waveforms biphasic proximally and monophasic in the mid, distal femoral artery and popliteal artery.  Probable tibial vessel disease, left greater than right.  Deylan Canterbury, [name] 01/06/2013, 2:12 PM

## 2013-01-06 NOTE — Progress Notes (Signed)
ANTIBIOTIC CONSULT NOTE - INITIAL  Pharmacy Consult for cefepime/levaquin Indication: pneumonia  Allergies  Allergen Reactions  . Captopril     Patient Measurements: Height: 5\' 6"  (167.6 cm) Weight: 197 lb (89.359 kg) IBW/kg (Calculated) : 63.8   Vital Signs: Temp: 98.2 F (36.8 C) (01/30 0500) Temp src: Oral (01/30 0156) BP: 129/59 mmHg (01/30 0500) Pulse Rate: 78  (01/30 0500) Intake/Output from previous day: 01/29 0701 - 01/30 0700 In: 590 [P.O.:240; I.V.:300; IV Piggyback:50] Out: -  Intake/Output from this shift:    Labs:  Basename 01/06/13 0636 01/05/13 2121  WBC 10.9* 16.9*  HGB 10.0* 12.2*  PLT 179 225  LABCREA -- --  CREATININE 1.62* 1.63*   Estimated Creatinine Clearance: 32.4 ml/min (by C-G formula based on Cr of 1.62). No results found for this basename: VANCOTROUGH:2,VANCOPEAK:2,VANCORANDOM:2,GENTTROUGH:2,GENTPEAK:2,GENTRANDOM:2,TOBRATROUGH:2,TOBRAPEAK:2,TOBRARND:2,AMIKACINPEAK:2,AMIKACINTROU:2,AMIKACIN:2, in the last 72 hours   Microbiology: No results found for this or any previous visit (from the past 720 hour(s)).  Medical History: Past Medical History  Diagnosis Date  . Dyslipidemia   . Hypertension   . Lung cancer   . DM type 2 (diabetes mellitus, type 2)   . COPD (chronic obstructive pulmonary disease)   . Atrial fibrillation   . CHF (congestive heart failure)   . CKD (chronic kidney disease)     Medications:  Scheduled:    . aspirin  81 mg Oral Daily  . atorvastatin  20 mg Oral Daily  . budesonide-formoterol  2 puff Inhalation BID  . ceFEPime (MAXIPIME) IV  2 g Intravenous Q24H  . [COMPLETED] diltiazem  10 mg Intravenous Once  . furosemide  80 mg Oral BID  . heparin  5,000 Units Subcutaneous Q8H  . [COMPLETED] levofloxacin (LEVAQUIN) IV  500 mg Intravenous Once  . levofloxacin (LEVAQUIN) IV  750 mg Intravenous Q48H  . metoprolol  50 mg Oral BID  . sodium chloride  3 mL Intravenous Q12H  . tiotropium  18 mcg Inhalation Daily   . [DISCONTINUED] ceFEPime (MAXIPIME) IV  1 g Intravenous Q8H  . [DISCONTINUED] diltiazem  10 mg Intravenous Once  . [DISCONTINUED] furosemide  40 mg Oral BID  . [DISCONTINUED] levofloxacin (LEVAQUIN) IV  750 mg Intravenous Q24H   Assessment: 79 yo who was admitted for PNA. Started on cefepime/levaquin for HCAP. Scr is elevated at baseline. Will adjust dose of abx.  Plan:  Levaquin 750mg  IV q48h x1 Cefepime 2g IV q24  Ulyses Southward [place] 01/06/2013,10:11 AM

## 2013-01-06 NOTE — Progress Notes (Addendum)
[facility] PROGRESS NOTE  Garrett Reese:[id number] DOB: August 28, 1921 DOA: 01/05/2013 PCP: Benita Stabile, MD  Assessment/Plan: 1. Fall, possible syncope: Syncope doubted by history, but he ha spotential for having hemodinamically unstable cardiac arrhythmias, with result in hypotension  . Many falls in the preceeding months . PT evaluation. Check 2-D echocardiogram if not recently done at [facility]. 2. Atrial fibrillation with RVR: patient was placed on  Iv diltiazem drip in the emergency department. He was switched to po metoprolol on 01/06/13 . 3. Possible Pneumonia: Appears stable without tachypnea or increased oxygen dependence, however has leukocytosis. Empiric antibiotics,  4. Acute renal failure  Vs CKD - : Baseline unknown last document bmet in epic from 2009 with normal creatinine . Patient did receive  IV fluids on the night of 01/06/13 . By the morning the patient was c/o dyspnea. Iv fluids were stopped. We did resume lasix at 80 mg po bid on 01/06/13.  5. Wounds bilateral lower extremities: Suspect arterial insufficiency. Check ABI.  6. COPD/chronic respiratory failure oxygen-dependent: Stable. Continue oxygen. Continues Spiriva, Symbicort, albuterol as needed. 7. Subacute encephalopathy: Etiology unclear. Need further records from [place] prior to initiating further workup as encephalopathy appears mild at this point. 8. PVD - moderate to severe L > R . Will ask [facility] for consult.   9. CHF unknown type - suspect patient had echo in the office. [facility] consult called - resumed po lasix   Code Status: full Family Communication: daughter  Disposition Plan: ? snf    Consultants:  [facility]   Procedures:    Antibiotics: Cefepime levofloxacin   HPI/Subjective: Confused , c/o dyspnea   Objective: Filed Vitals:   01/05/13 2335 01/06/13 0036 01/06/13 0156 01/06/13 0500  BP: 113/53 119/63 139/72 129/59  Pulse: 87  71 78  Temp:   97.7 F (36.5  C) 98.2 F (36.8 C)  TempSrc:   Oral   Resp: 18 20 18 18   Height:   5\' 6"  (1.676 m)   Weight:   89.359 kg (197 lb)   SpO2: 100% 100% 98% 93%   Patient Vitals for the past 24 hrs:  BP Temp Temp src Pulse Resp SpO2 Height Weight  01/06/13 0500 129/59 mmHg 98.2 F (36.8 C) - 78  18  93 % - -  01/06/13 0156 139/72 mmHg 97.7 F (36.5 C) Oral 71  18  98 % 5\' 6"  (1.676 m) 89.359 kg (197 lb)  01/06/13 0036 119/63 mmHg - - - 20  100 % - -  01/05/13 2335 113/53 mmHg - - 87  18  100 % - -  01/05/13 2326 96/51 mmHg - - 75  16  99 % - -  01/05/13 2104 107/56 mmHg 97.8 F (36.6 C) - - 18  - - -  01/05/13 2052 - - - - - 98 % - -     Intake/Output Summary (Last 24 hours) at 01/06/13 0754 Last data filed at 01/06/13 0600  Gross per 24 hour  Intake    590 ml  Output      0 ml  Net    590 ml   Filed Weights   01/06/13 0156  Weight: 89.359 kg (197 lb)    Exam:   General:  Alert, not oriented to situation   Cardiovascular: irreg irreg   Respiratory: min crackles   Abdomen: obese , soft  LE - with peripheral cyanosis, wounds covered by gauze, pulses not palpable   Data Reviewed: Basic Metabolic Panel:  Lab 01/06/13 0636 01/05/13 2121  NA 137 141  K 4.5 5.1  CL 95* 95*  CO2 32 35*  GLUCOSE 125* 131*  BUN 39* 36*  CREATININE 1.62* 1.63*  CALCIUM 8.9 9.4  MG -- --  PHOS -- --   Liver Function Tests:  Lab 01/05/13 2121  AST 88*  ALT 42  ALKPHOS 230*  BILITOT 1.0  PROT 7.8  ALBUMIN 2.8*   No results found for this basename: LIPASE:5,AMYLASE:5 in the last 168 hours No results found for this basename: AMMONIA:5 in the last 168 hours CBC:  Lab 01/05/13 2121  WBC 16.9*  NEUTROABS 15.6*  HGB 12.2*  HCT 37.9*  MCV 94.8  PLT 225   Cardiac Enzymes:  Lab 01/05/13 2311  CKTOTAL --  CKMB --  CKMBINDEX --  TROPONINI <0.30   BNP (last 3 results)  Basename 01/05/13 2311  PROBNP 12233.0*   CBG:  Lab 01/06/13 0736  GLUCAP 104*    No results found for this  or any previous visit (from the past 240 hour(s)).   Studies: Dg Chest 2 View  01/05/2013  *RADIOLOGY REPORT*  Clinical Data: Fall, chest pain.  CHEST - 2 VIEW  Comparison: 12/25/2011  Findings: Cardiomegaly.  Bibasilar opacities have increased in the interval.  In addition, increased interstitial markings.  Small effusions not excluded.  No pneumothorax.  Mediastinal contours unchanged.  No acute osseous finding.  IMPRESSION: Increased bibasilar opacities and interstitial markings.  May represent pneumonia or edema.   Original Report Authenticated By: Jearld Lesch, M.D.    Dg Tibia/fibula Left  01/05/2013  *RADIOLOGY REPORT*  Clinical Data: Fall, leg abrasions.  LEFT TIBIA AND FIBULA - 2 VIEW  Comparison: Contralateral tibia / fibula  Findings: Left tibia / fibula radiographs show no displaced fracture.  Partially imaged degenerative changes of the knee joint. These are not optimized evaluate the joint spaces.  Enthesopathic changes at the tibial tubercle.  Atherosclerotic vascular calcifications.  IMPRESSION: No acute osseous abnormality of the left tibia / fibula.   Original Report Authenticated By: Jearld Lesch, M.D.    Dg Tibia/fibula Right  01/05/2013  *RADIOLOGY REPORT*  Clinical Data: Fall, multiple abrasions/bruising to the right and left lower legs.  RIGHT TIBIA AND FIBULA - 2 VIEW  Comparison: Contralateral tibia / fibula  Findings: Right tibia / fibula radiographs show no displaced fracture.  Partially imaged degenerative changes of the knee joint. These views are not optimized to evaluate the joint spaces. Atherosclerotic vascular calcifications.  IMPRESSION:  No acute osseous abnormality of the right tibia / fibula.   Original Report Authenticated By: Jearld Lesch, M.D.    Ct Head Wo Contrast  01/05/2013  *RADIOLOGY REPORT*  Clinical Data:  Fall, on anticoagulation  CT HEAD WITHOUT CONTRAST CT CERVICAL SPINE WITHOUT CONTRAST  Technique:  Multidetector CT imaging of the head and  cervical spine was performed following the standard protocol without intravenous contrast.  Multiplanar CT image reconstructions of the cervical spine were also generated.  Comparison:   None  CT HEAD  Findings: Prominence of the sulci, cisterns, and ventricles, in keeping with volume loss. There are subcortical and periventricular white matter hypodensities, a nonspecific finding most often seen with chronic microangiopathic changes.  There is no evidence for acute hemorrhage, overt hydrocephalus, mass lesion, or abnormal extra-axial fluid collection.  No definite CT evidence for acute cortical based (large artery) infarction. The visualized paranasal sinuses and mastoid air cells are predominately clear. Scattered atherosclerosis.  IMPRESSION: Volume  loss and white matter changes, not unexpected for age.  No CT evidence of acute intracranial abnormality.  CT CERVICAL SPINE  Findings: Centrolobular emphysematous changes of the lung apices. Scattered atherosclerotic vascular calcifications.  Maintained craniocervical relationship.  No dens fracture.  Maintained vertebral body height and alignment.  Multilevel degenerative changes with uncovertebral joint hypertrophy, facet arthropathy, and disc osteophyte complexes resulting in mild central canal narrowing.  Paravertebral soft tissues within normal limits.  IMPRESSION: Multilevel degenerative changes.  No acute osseous abnormality of the cervical spine.   Original Report Authenticated By: Jearld Lesch, M.D.    Ct Cervical Spine Wo Contrast  01/05/2013  *RADIOLOGY REPORT*  Clinical Data:  Fall, on anticoagulation  CT HEAD WITHOUT CONTRAST CT CERVICAL SPINE WITHOUT CONTRAST  Technique:  Multidetector CT imaging of the head and cervical spine was performed following the standard protocol without intravenous contrast.  Multiplanar CT image reconstructions of the cervical spine were also generated.  Comparison:   None  CT HEAD  Findings: Prominence of the sulci,  cisterns, and ventricles, in keeping with volume loss. There are subcortical and periventricular white matter hypodensities, a nonspecific finding most often seen with chronic microangiopathic changes.  There is no evidence for acute hemorrhage, overt hydrocephalus, mass lesion, or abnormal extra-axial fluid collection.  No definite CT evidence for acute cortical based (large artery) infarction. The visualized paranasal sinuses and mastoid air cells are predominately clear. Scattered atherosclerosis.  IMPRESSION: Volume loss and white matter changes, not unexpected for age.  No CT evidence of acute intracranial abnormality.  CT CERVICAL SPINE  Findings: Centrolobular emphysematous changes of the lung apices. Scattered atherosclerotic vascular calcifications.  Maintained craniocervical relationship.  No dens fracture.  Maintained vertebral body height and alignment.  Multilevel degenerative changes with uncovertebral joint hypertrophy, facet arthropathy, and disc osteophyte complexes resulting in mild central canal narrowing.  Paravertebral soft tissues within normal limits.  IMPRESSION: Multilevel degenerative changes.  No acute osseous abnormality of the cervical spine.   Original Report Authenticated By: Jearld Lesch, M.D.     Scheduled Meds:    . aspirin  81 mg Oral Daily  . atorvastatin  20 mg Oral Daily  . budesonide-formoterol  2 puff Inhalation BID  . ceFEPime (MAXIPIME) IV  1 g Intravenous Q8H  . furosemide  40 mg Oral BID  . heparin  5,000 Units Subcutaneous Q8H  . levofloxacin (LEVAQUIN) IV  750 mg Intravenous Q24H  . metoprolol  50 mg Oral BID  . sodium chloride  3 mL Intravenous Q12H  . tiotropium  18 mcg Inhalation Daily   Continuous Infusions:    Principal Problem:  *Atrial fibrillation with rapid ventricular response Active Problems:  DYSLIPIDEMIA  HYPERTENSION  EMPHYSEMA, SEVERE  Chronic respiratory failure  Fall  Syncope  HCAP (healthcare-associated pneumonia)   Acute renal failure  Lower extremity ulceration  Encephalopathy      Braniyah Besse  [facility] Pager [phone]. If 7PM-7AM, please contact night-coverage at [email or web address], password [facility] 01/06/2013, 7:54 AM  LOS: 1 day

## 2013-01-06 NOTE — Progress Notes (Signed)
  Echocardiogram 2D Echocardiogram has been performed.  Garrett Reese FRANCES 01/06/2013, 6:18 PM 

## 2013-01-06 NOTE — ED Notes (Signed)
Admitting MD Irene Limbo in with pt.

## 2013-01-06 NOTE — Consult Note (Addendum)
[facility] [place]  Referred by:  Dr. Lavera Guise [facility])  Reason for referral: bilateral leg ischemia  History of Present Illness  Garrett Reese is a 79 y.o. (12/26/1920) male who presents with chief complaint: "bump on my right leg".  Parts of patient's history are not consistent with the EMR, suggesting some confusion in this patient.  Onset of symptom occurred by report over one month ago.  Pt notes developing a blister on right shin which was drained by his PCP.  This wound worsened since then which eventually lead him to be admitted at [facility].  Prior to one month ago, he denies any problems with walking.  He thought he was was able to ambulate without claudication.  He denies any rest pain.  He notes only mild pain in his shin with dressing changes.  Atherosclerotic risk factors include: hyperlipidemia, HTN, and diabetes.  He notes h/o CHF and chronic atrial fibrillation.  He does not know if he has been on coumadin.  Past Medical History  Diagnosis Date  . Dyslipidemia   . Hypertension   . Lung cancer   . DM type 2 (diabetes mellitus, type 2)   . COPD (chronic obstructive pulmonary disease)   . Atrial fibrillation   . CHF (congestive heart failure)   . CKD (chronic kidney disease)     Past Surgical History  Procedure Date  . Cholecystectomy   . Lung removal, partial     1991    History   Social History  . Marital Status: Widowed    Spouse Name: N/A    Number of Children: N/A  . Years of Education: N/A   Occupational History  . Not on file.   Social History Main Topics  . Smoking status: Former Smoker -- 2.0 packs/day for 45 years    Types: Cigarettes    Quit date: 12/09/1979  . Smokeless tobacco: Not on file  . Alcohol Use: Not on file  . Drug Use: Not on file  . Sexually Active: Not on file   Other Topics Concern  . Not on file   Social History Narrative  . No narrative on file    Family History  Problem Relation Age of  Onset  . Heart attack Father     No current facility-administered medications on file prior to encounter.   Current Outpatient Prescriptions on File Prior to Encounter  Medication Sig Dispense Refill  . albuterol (PROVENTIL HFA;VENTOLIN HFA) 108 (90 BASE) MCG/ACT inhaler Inhale 2 puffs into the lungs every 6 (six) hours as needed.  1 Inhaler  3  . atorvastatin (LIPITOR) 20 MG tablet Take 20 mg by mouth daily.      . budesonide-formoterol (SYMBICORT) 160-4.5 MCG/ACT inhaler Inhale 2 puffs into the lungs 2 (two) times daily.        . fexofenadine (ALLEGRA) 180 MG tablet Take 180 mg by mouth daily as needed.        Marland Kitchen glipiZIDE (GLUCOTROL XL) 2.5 MG 24 hr tablet Take 5 mg by mouth daily.      Marland Kitchen guaiFENesin (MUCINEX) 600 MG 12 hr tablet Take 600 mg by mouth 2 (two) times daily.        . metoprolol (LOPRESSOR) 50 MG tablet Take 50 mg by mouth 2 (two) times daily.      . metoprolol (TOPROL-XL) 50 MG 24 hr tablet Take 75 mg by mouth daily.       . metroNIDAZOLE (METROCREAM) 0.75 % cream Apply 1  application topically 2 (two) times daily.      . OXYGEN-HELIUM IN Inhale 3 L/min into the lungs daily.      . potassium chloride (K-DUR) 10 MEQ tablet Take 10 mEq by mouth daily.      Marland Kitchen tiotropium (SPIRIVA) 18 MCG inhalation capsule Place 18 mcg into inhaler and inhale daily.        . furosemide (LASIX) 40 MG tablet Take 40 mg by mouth 4 (four) times daily.         Allergies  Allergen Reactions  . Captopril     REVIEW OF SYSTEMS:  (Positives checked otherwise negative)  CARDIOVASCULAR:  [ ]  chest pain, [ ]  chest pressure, [x]  palpitations, [x]  shortness of breath when laying flat, [x]  shortness of breath with exertion,   [ ]  pain in feet when walking, [ ]  pain in feet when laying flat, [ ]  history of blood clot in veins (DVT), [ ]  history of phlebitis, [x]  swelling in legs, [ ]  varicose veins  PULMONARY:  [x]  cough, [ ]  asthma, [ ]  wheezing  NEUROLOGIC:  [ ]  weakness in arms or legs, [ ]  numbness in  arms or legs, [ ]  difficulty speaking or slurred speech, [ ]  temporary loss of vision in one eye, [ ]  dizziness  HEMATOLOGIC:  [ ]  bleeding problems, [ ]  problems with blood clotting too easily  MUSCULOSKEL:  [ ]  joint pain, [ ]  joint swelling  GASTROINTEST:  [ ]   Vomiting blood, [ ]   Blood in stool     GENITOURINARY:  [ ]   Burning with urination, [ ]   Blood in urine  PSYCHIATRIC:  [ ]  history of major depression  INTEGUMENTARY:  [ ]  rashes, [x]  ulcers  CONSTITUTIONAL:  [ ]  fever, [ ]  chills   Physical Examination  Filed Vitals:   01/06/13 0500 01/06/13 0918 01/06/13 1137 01/06/13 1400  BP: 129/59  144/60 112/60  Pulse: 78  108 69  Temp: 98.2 F (36.8 C)   97.7 F (36.5 C)  TempSrc:      Resp: 18   18  Height:      Weight:      SpO2: 93% 95%  96%   Body mass index is 31.80 kg/(m^2).  General: Alert and orient to self and place, elderly, somewhat confused  Head: [place]/AT  Ear/Nose/Throat: Hearing grossly intact, nares w/o erythema or drainage, oropharynx w/o Erythema/Exudate  Eyes: PERRLA, EOMI  Neck: Supple, no nuchal rigidity, no palpable LAD  Pulmonary: Sym exp, good air movt, CTAB, no rhonchi, & wheezing, +rales  Cardiac: irregularly irregular rhythm, no Murmurs, rubs or gallops  Vascular: Vessel Right Left  Radial Palpable Palpable  Ulnar Faintly Palpable Faintly Palpable  Brachial Palpable Palpable  Carotid Palpable, without bruit Palpable, without bruit  Aorta Not palpable due pannus N/A  Femoral Faintly Palpable Faintly Palpable  Popliteal Faintly palpable Faintly palpable  PT Not Palpable Not Palpable  DP Not Palpable Not Palpable   Gastrointestinal: taunt abdominal wall that was soft later in exam, NTND, -G/R, - HSM, - masses, - CVAT B, infraumbilical incision  Musculoskeletal: Limited exam: upper extremities 5/5, lower extremities 3-4/5 , intact B DF/PF, Bilateral patellar abrasions with denuded skin, B shin with extensive denude area , L shin with  ischemic appearance, cyanotic hue in R > L toes, R lower leg warm to proximal calf, L lower leg cool  Neurologic: Grossly CN intact (limited due to pt cooperations), Pain and light touch intact in extremities included feet, Motor  exam as listed above  Psychiatric: Poor judgment , Mood & affect appropriate for pt's clinical situation, memory gaps demonstrated  Dermatologic: See M/S exam for extremity exam, extensive echymotic areas in arms with multiple scabs on arms  Lymph : No Cervical, Axillary, or Inguinal lymphadenopathy   Laboratory: CBC:    Component Value Date/Time   WBC 10.9* 01/06/2013 0636   RBC 3.34* 01/06/2013 0636   HGB 10.0* 01/06/2013 0636   HCT 31.4* 01/06/2013 0636   PLT 179 01/06/2013 0636   MCV 94.0 01/06/2013 0636   MCH 29.9 01/06/2013 0636   MCHC 31.8 01/06/2013 0636   RDW 15.0 01/06/2013 0636   LYMPHSABS 0.3* 01/05/2013 2121   MONOABS 1.0 01/05/2013 2121   EOSABS 0.0 01/05/2013 2121   BASOSABS 0.0 01/05/2013 2121    BMP:    Component Value Date/Time   NA 137 01/06/2013 0636   K 4.5 01/06/2013 0636   CL 95* 01/06/2013 0636   CO2 32 01/06/2013 0636   GLUCOSE 125* 01/06/2013 0636   BUN 39* 01/06/2013 0636   CREATININE 1.62* 01/06/2013 0636   CALCIUM 8.9 01/06/2013 0636   GFRNONAA 36* 01/06/2013 0636   GFRAA 42* 01/06/2013 0636   LFT: Alk Phos 230, ALT 42, AST 88, Tbil 1.0, Alb 2.8 BNP 12233  Coagulation: Lab Results  Component Value Date   INR 1.32 01/05/2013   INR 1.2 RATIO 03/11/2007   No results found for this basename: PTT   Radiology Dg Chest 2 View  01/05/2013  *RADIOLOGY REPORT*  Clinical Data: Fall, chest pain.  CHEST - 2 VIEW  Comparison: 12/25/2011  Findings: Cardiomegaly.  Bibasilar opacities have increased in the interval.  In addition, increased interstitial markings.  Small effusions not excluded.  No pneumothorax.  Mediastinal contours unchanged.  No acute osseous finding.  IMPRESSION: Increased bibasilar opacities and interstitial markings.  May  represent pneumonia or edema.   Original Report Authenticated By: Jearld Lesch, M.D.    Dg Tibia/fibula Left  01/05/2013  *RADIOLOGY REPORT*  Clinical Data: Fall, leg abrasions.  LEFT TIBIA AND FIBULA - 2 VIEW  Comparison: Contralateral tibia / fibula  Findings: Left tibia / fibula radiographs show no displaced fracture.  Partially imaged degenerative changes of the knee joint. These are not optimized evaluate the joint spaces.  Enthesopathic changes at the tibial tubercle.  Atherosclerotic vascular calcifications.  IMPRESSION: No acute osseous abnormality of the left tibia / fibula.   Original Report Authenticated By: Jearld Lesch, M.D.    Dg Tibia/fibula Right  01/05/2013  *RADIOLOGY REPORT*  Clinical Data: Fall, multiple abrasions/bruising to the right and left lower legs.  RIGHT TIBIA AND FIBULA - 2 VIEW  Comparison: Contralateral tibia / fibula  Findings: Right tibia / fibula radiographs show no displaced fracture.  Partially imaged degenerative changes of the knee joint. These views are not optimized to evaluate the joint spaces. Atherosclerotic vascular calcifications.  IMPRESSION:  No acute osseous abnormality of the right tibia / fibula.   Original Report Authenticated By: Jearld Lesch, M.D.    Ct Head Wo Contrast  01/05/2013  *RADIOLOGY REPORT*  Clinical Data:  Fall, on anticoagulation  CT HEAD WITHOUT CONTRAST CT CERVICAL SPINE WITHOUT CONTRAST  Technique:  Multidetector CT imaging of the head and cervical spine was performed following the standard protocol without intravenous contrast.  Multiplanar CT image reconstructions of the cervical spine were also generated.  Comparison:   None  CT HEAD  Findings: Prominence of the sulci, cisterns, and ventricles, in  keeping with volume loss. There are subcortical and periventricular white matter hypodensities, a nonspecific finding most often seen with chronic microangiopathic changes.  There is no evidence for acute hemorrhage, overt  hydrocephalus, mass lesion, or abnormal extra-axial fluid collection.  No definite CT evidence for acute cortical based (large artery) infarction. The visualized paranasal sinuses and mastoid air cells are predominately clear. Scattered atherosclerosis.  IMPRESSION: Volume loss and white matter changes, not unexpected for age.  No CT evidence of acute intracranial abnormality.  CT CERVICAL SPINE  Findings: Centrolobular emphysematous changes of the lung apices. Scattered atherosclerotic vascular calcifications.  Maintained craniocervical relationship.  No dens fracture.  Maintained vertebral body height and alignment.  Multilevel degenerative changes with uncovertebral joint hypertrophy, facet arthropathy, and disc osteophyte complexes resulting in mild central canal narrowing.  Paravertebral soft tissues within normal limits.  IMPRESSION: Multilevel degenerative changes.  No acute osseous abnormality of the cervical spine.   Original Report Authenticated By: Jearld Lesch, M.D.    Ct Cervical Spine Wo Contrast  01/05/2013  *RADIOLOGY REPORT*  Clinical Data:  Fall, on anticoagulation  CT HEAD WITHOUT CONTRAST CT CERVICAL SPINE WITHOUT CONTRAST  Technique:  Multidetector CT imaging of the head and cervical spine was performed following the standard protocol without intravenous contrast.  Multiplanar CT image reconstructions of the cervical spine were also generated.  Comparison:   None  CT HEAD  Findings: Prominence of the sulci, cisterns, and ventricles, in keeping with volume loss. There are subcortical and periventricular white matter hypodensities, a nonspecific finding most often seen with chronic microangiopathic changes.  There is no evidence for acute hemorrhage, overt hydrocephalus, mass lesion, or abnormal extra-axial fluid collection.  No definite CT evidence for acute cortical based (large artery) infarction. The visualized paranasal sinuses and mastoid air cells are predominately clear. Scattered  atherosclerosis.  IMPRESSION: Volume loss and white matter changes, not unexpected for age.  No CT evidence of acute intracranial abnormality.  CT CERVICAL SPINE  Findings: Centrolobular emphysematous changes of the lung apices. Scattered atherosclerotic vascular calcifications.  Maintained craniocervical relationship.  No dens fracture.  Maintained vertebral body height and alignment.  Multilevel degenerative changes with uncovertebral joint hypertrophy, facet arthropathy, and disc osteophyte complexes resulting in mild central canal narrowing.  Paravertebral soft tissues within normal limits.  IMPRESSION: Multilevel degenerative changes.  No acute osseous abnormality of the cervical spine.   Original Report Authenticated By: Jearld Lesch, M.D.    Non-Invasive Vascular Imaging  ABI (Date: 01/06/13)  RLE: moderate peripheral arterial disease  LLE: moderate to severe peripheral arterial disease  Medical Decision Making  Garrett Reese is a 79 y.o. male who presents with: multiple active co-morbidities including afib, CHF, cognitive defects possible dementia, and bilateral critical limb ischemia with potential limb loss   Clearly, this patient is at risk for loss of his legs (L>R) with his pretibial wounds.  He does not have threatened limbs so no emergent intervention is indicated.  The issue is I doubt he can survive an extensive surgical revascularization with his multiple co-morbidities.    Additionally, such a revascularization is not of value if he does not regain ambulatory status.  In this pt, I doubt his long-term ability to continue walking.  I'm not certain if the elevated alk phos is a reflection of repeated falls or if there is another dx including possible neoplasm.  The echymosis in the arm could be explained by coumadin or aspirin but I don't see either agent listed, so I  suspect a great deal of his dermatologic findings are due to repeat trauma  In this patient, I would  emphasize local wound care.  Wound care has already left their recommendations.  As surgical revasscularization is not an option, the only other option is endovascular intervention despite the nephrotoxicity of the contrast dye.  This can be attenuated with concomitant use of carbon dioxide as a contrast agent.  Given the limb threatening status of this patient, I recommend proceeding with an: Aortogram, Left leg runoff and possible intervention. I discussed with the patient the nature of angiographic procedures, especially the limited patencies of any endovascular intervention.   The patient is aware of that the risks of an angiographic procedure include but are not limited to: bleeding, infection, access site complications, renal failure, embolization, rupture of vessel, dissection, possible need for emergent surgical intervention, possible need for surgical procedures to treat the patient's pathology, and stroke and death.   The patient is going to have a discussion with his daughter prior to proceeding.  At earliest, he could be scheduled for Monday, allowing for additional medical optimization over the weekend. Another option is proceeding with a palliative approach consisting of local wound care, antibiotics as need, pain medications, and possible palliative amputation as needed.  Hopefully, the patient and his family can come to a decision sometime over the weekend.  Thank you for allowing [place] to participate in this patient's care.  Leonides Sake, MD [facility] [place] Office: [phone] Pager: [phone]  01/06/2013, 7:03 PM

## 2013-01-06 NOTE — Evaluation (Signed)
Physical Therapy Evaluation Patient Details Name: Garrett Reese MRN: [id number] DOB: 05-May-1921 Today's Date: 01/06/2013 Time: 4098-1191 PT Time Calculation (min): 38 min  PT Assessment / Plan / Recommendation Clinical Impression  pt admitted after fall having just left [facility].  Work up started on adm with afib and RVR possible Pna and with productive cough. On eval, pt is generally weak with decrr activity tolerance.  Pt can benefit from PT to maximize independence.    PT Assessment  Patient needs continued PT services    Follow Up Recommendations  Home health PT;Supervision for mobility/OOB    Does the patient have the potential to tolerate intense rehabilitation      Barriers to Discharge Decreased caregiver support      Equipment Recommendations  Other (comment) (TBA)    Recommendations for Other Services     Frequency Min 3X/week    Precautions / Restrictions Precautions Precautions: Fall   Pertinent Vitals/Pain Pt was dyspneic after the short walk around the bed at 2/4, but unable to get a sat reading.  He quickly returned to a calm state      Mobility  Bed Mobility Bed Mobility: Supine to Sit;Sitting - Scoot to Edge of Bed Supine to Sit: 4: Min assist Sitting - Scoot to [place] of Bed: 4: Min guard Details for Bed Mobility Assistance: vc's for hand placement and ways to make it easier Transfers Transfers: Sit to Stand;Stand to Sit Sit to Stand: 4: Min assist;With upper extremity assist;From bed Stand to Sit: 4: Min assist;With upper extremity assist;To bed Details for Transfer Assistance: vc's for hand placement, steadying assist Ambulation/Gait Ambulation/Gait Assistance: 4: Min assist Ambulation Distance (Feet): 15 Feet (limited by other RN needing to give meds) Assistive device: None Ambulation/Gait Assistance Details: mildly unsteady and staggery steps.  Guarded. Gait Pattern: Step-through pattern;Decreased step length - right;Decreased  step length - left;Decreased stride length Stairs: No Wheelchair Mobility Wheelchair Mobility: No    Shoulder Instructions     Exercises     PT Diagnosis: Difficulty walking;Generalized weakness  PT Problem List: Decreased strength;Decreased activity tolerance;Decreased balance;Cardiopulmonary status limiting activity PT Treatment Interventions: Gait training;Functional mobility training;Therapeutic activities;Balance training;Patient/family education   PT Goals Acute Rehab PT Goals PT Goal Formulation: With patient Time For Goal Achievement: 01/20/13 Potential to Achieve Goals: Good Pt will go Supine/Side to Sit: Independently;with HOB 0 degrees PT Goal: Supine/Side to Sit - Progress: Goal set today Pt will go Sit to Stand: with modified independence PT Goal: Sit to Stand - Progress: Goal set today Pt will Transfer Bed to Chair/Chair to Bed: with modified independence PT Transfer Goal: Bed to Chair/Chair to Bed - Progress: Goal set today Pt will Ambulate: >150 feet;with modified independence;with least restrictive assistive device PT Goal: Ambulate - Progress: Goal set today  Visit Information  Last PT Received On: 01/06/13 Assistance Needed: +1    Subjective Data  Subjective: If I don't do anything, I'm going to be bedridden Patient Stated Goal: Figure out what's going on   Prior Functioning  Home Living Lives With: Alone Available Help at Discharge: Family;Available PRN/intermittently;Other (Comment) (daughter helps out with food and cleaning) Type of Home: House Home Access: Elevator Home Layout: Multi-level;Able to live on main level with bedroom/bathroom Bathroom Shower/Tub: Engineer, manufacturing systems: Standard Home Adaptive Equipment: Walker - rolling;Straight cane Prior Function Level of Independence: Independent Driving: Yes Communication Communication: No difficulties    Cognition  Overall Cognitive Status: Appears within functional limits for tasks  assessed/performed Arousal/Alertness:  Awake/alert Orientation Level: Appears intact for tasks assessed Behavior During Session: [facility] for tasks performed    Extremity/Trunk Assessment Right Lower Extremity Assessment RLE ROM/Strength/Tone: Deficits;WFL for tasks assessed RLE ROM/Strength/Tone Deficits: grossly weak at 4/5 bilaterally Left Lower Extremity Assessment LLE ROM/Strength/Tone: Deficits;WFL for tasks assessed LLE ROM/Strength/Tone Deficits: see R LE Trunk Assessment Trunk Assessment: Normal   Balance Balance Balance Assessed: Yes Static Sitting Balance Static Sitting - Balance Support: Feet supported;No upper extremity supported;Left upper extremity supported;Right upper extremity supported Static Sitting - Level of Assistance: 7: Independent Static Sitting - Comment/# of Minutes: sat EOB while taking BP's and getting meds  20 mins Dynamic Standing Balance Dynamic Standing - Balance Support: During functional activity;No upper extremity supported Dynamic Standing - Level of Assistance: 4: Min assist  End of Session PT - End of Session Activity Tolerance: Patient tolerated treatment well Patient left: with nursing in room;with call bell/phone within reach (sitting EOB) Nurse Communication: Mobility status  GP     Cortavious Nix, Eliseo Gum 01/06/2013, 12:13 PM  01/06/2013  [place], PT [phone] (pager)

## 2013-01-06 NOTE — Consult Note (Signed)
WOC consult Note Reason for Consult: multiple skin tears bil. LE.  Pt reports he has had bilateral pretibial wounds for some time after "multiple falls", also has trauma wounds on his bilateral knees.  Skin tear noted to the left of the knee.  Wound type: skin tears and abrasions from falls. Measurement: Knees with scabs 2.0cm x 2.0 bilaterally Skin tears left lateral knee- both aprox. 2.5cm x 2.0cm x 0.2cm Large skin tear with no intact skin flap right pretibial: 9.0cm x 3.5cm x 0.2cm  Large skin tear with partial distal skin flap intact (has some retained hematoma under skin flap distally): 7.5cm x 7.5cm x 0.2cm  Wound bed: all wounds are pink, moist.  The left pretibial wound does have some dark tissue distally but this is related to retained hematoma and it is small, feel it will reabsorb.  Drainage (amount, consistency, odor) minimal serous from all sites that are open Periwound: all are without s/s of infection or other problems Dressing procedure/placement/frequency: nonadherent dressing to the larger open skin tears of the bil. LE to lessen pain with dressing changes and topped with gauze for drainage control. Silicone foam dressings for the bil. Knees to protect and insulate, as well as moist wound healing for the open skin tears.   Discussed with pt and with bedside nursing.  Issue at current is this pt has no palpable pulses and his feet are cold and slightly cyanotic.  ABI are pending and vascular mapping in progress.  Re consult if needed, will not follow at this time. Thanks  Kalven Ganim [employer], CWOCN [phone])

## 2013-01-07 DIAGNOSIS — I739 Peripheral vascular disease, unspecified: Secondary | ICD-10-CM

## 2013-01-07 DIAGNOSIS — J438 Other emphysema: Secondary | ICD-10-CM

## 2013-01-07 DIAGNOSIS — J9620 Acute and chronic respiratory failure, unspecified whether with hypoxia or hypercapnia: Secondary | ICD-10-CM

## 2013-01-07 DIAGNOSIS — I5032 Chronic diastolic (congestive) heart failure: Secondary | ICD-10-CM

## 2013-01-07 DIAGNOSIS — J811 Chronic pulmonary edema: Secondary | ICD-10-CM

## 2013-01-07 DIAGNOSIS — M79669 Pain in unspecified lower leg: Secondary | ICD-10-CM | POA: Diagnosis present

## 2013-01-07 LAB — CBC
HCT: 35.2 % — ABNORMAL LOW (ref 39.0–52.0)
Hemoglobin: 11.2 g/dL — ABNORMAL LOW (ref 13.0–17.0)
MCHC: 31.8 g/dL (ref 30.0–36.0)
MCV: 94.6 fL (ref 78.0–100.0)
RDW: 15 % (ref 11.5–15.5)

## 2013-01-07 LAB — BASIC METABOLIC PANEL WITH GFR
BUN: 43 mg/dL — ABNORMAL HIGH (ref 6–23)
CO2: 35 meq/L — ABNORMAL HIGH (ref 19–32)
Calcium: 9.4 mg/dL (ref 8.4–10.5)
Chloride: 94 meq/L — ABNORMAL LOW (ref 96–112)
Creatinine, Ser: 1.67 mg/dL — ABNORMAL HIGH (ref 0.50–1.35)
GFR calc Af Amer: 40 mL/min — ABNORMAL LOW
GFR calc non Af Amer: 35 mL/min — ABNORMAL LOW
Glucose, Bld: 126 mg/dL — ABNORMAL HIGH (ref 70–99)
Potassium: 5 meq/L (ref 3.5–5.1)
Sodium: 137 meq/L (ref 135–145)

## 2013-01-07 LAB — GLUCOSE, CAPILLARY
Glucose-Capillary: 138 mg/dL — ABNORMAL HIGH (ref 70–99)
Glucose-Capillary: 108 mg/dL — ABNORMAL HIGH (ref 70–99)
Glucose-Capillary: 136 mg/dL — ABNORMAL HIGH (ref 70–99)

## 2013-01-07 MED ORDER — VANCOMYCIN HCL IN DEXTROSE 1-5 GM/200ML-% IV SOLN
1000.0000 mg | INTRAVENOUS | Status: DC
Start: 2013-01-07 — End: 2013-01-08
  Administered 2013-01-07: 1000 mg via INTRAVENOUS
  Filled 2013-01-07 (×2): qty 200

## 2013-01-07 NOTE — Progress Notes (Signed)
SUBJECTIVE:  Looks much better than when I saw him in the office  OBJECTIVE:   Vitals:   Filed Vitals:   01/06/13 1400 01/06/13 2022 01/06/13 2100 01/07/13 0600  BP: 112/60  136/85 152/74  Pulse: 69  73 79  Temp: 97.7 F (36.5 C)  97.5 F (36.4 C) 97 F (36.1 C)  TempSrc:      Resp: 18  20 20   Height:      Weight:      SpO2: 96% 96% 97% 94%   I&O's:   Intake/Output Summary (Last 24 hours) at 01/07/13 0819 Last data filed at 01/06/13 1328  Gross per 24 hour  Intake      0 ml  Output    100 ml  Net   -100 ml   TELEMETRY: Reviewed telemetry pt in atrial fibrillation in the 80's   PHYSICAL EXAM General: Well developed, well nourished, in no acute distress Head: Eyes PERRLA, No xanthomas.   Normal cephalic and atramatic  Lungs:  Decreased BS at bases with no crackles Heart: irregularly irregular S1 S2 Pulses are 2+ & equal. Abdomen: Bowel sounds are positive, abdomen soft and non-tender without masses  Extremities:   Bilateral LE bandages without obvious edema Neuro: Alert and oriented X 3. Psych:  Good affect, responds appropriately   LABS: Basic Metabolic Panel:  Basename 01/07/13 0542 01/06/13 0636  NA 137 137  K 5.0 4.5  CL 94* 95*  CO2 35* 32  GLUCOSE 126* 125*  BUN 43* 39*  CREATININE 1.67* 1.62*  CALCIUM 9.4 8.9  MG -- --  PHOS -- --   Liver Function Tests:  [facility] 01/05/13 2121  AST 88*  ALT 42  ALKPHOS 230*  BILITOT 1.0  PROT 7.8  ALBUMIN 2.8*   No results found for this basename: LIPASE:2,AMYLASE:2 in the last 72 hours CBC:  Basename 01/07/13 0542 01/06/13 0636 01/05/13 2121  WBC 12.5* 10.9* --  NEUTROABS -- -- 15.6*  HGB 11.2* 10.0* --  HCT 35.2* 31.4* --  MCV 94.6 94.0 --  PLT 216 179 --   Cardiac Enzymes:  Basename 01/05/13 2311  CKTOTAL --  CKMB --  CKMBINDEX --  TROPONINI <0.30   Thyroid Function Tests:  Basename 01/06/13 0636  TSH 1.009  T4TOTAL --  T3FREE --  THYROIDAB --   Anemia Panel: No results found for  this basename: VITAMINB12,FOLATE,FERRITIN,TIBC,IRON,RETICCTPCT in the last 72 hours Coag Panel:   Lab Results  Component Value Date   INR 1.32 01/05/2013   INR 1.2 RATIO 03/11/2007    RADIOLOGY: Dg Chest 2 View  01/05/2013  *RADIOLOGY REPORT*  Clinical Data: Fall, chest pain.  CHEST - 2 VIEW  Comparison: 12/25/2011  Findings: Cardiomegaly.  Bibasilar opacities have increased in the interval.  In addition, increased interstitial markings.  Small effusions not excluded.  No pneumothorax.  Mediastinal contours unchanged.  No acute osseous finding.  IMPRESSION: Increased bibasilar opacities and interstitial markings.  May represent pneumonia or edema.   Original Report Authenticated By: Jearld Lesch, M.D.    Dg Tibia/fibula Left  01/05/2013  *RADIOLOGY REPORT*  Clinical Data: Fall, leg abrasions.  LEFT TIBIA AND FIBULA - 2 VIEW  Comparison: Contralateral tibia / fibula  Findings: Left tibia / fibula radiographs show no displaced fracture.  Partially imaged degenerative changes of the knee joint. These are not optimized evaluate the joint spaces.  Enthesopathic changes at the tibial tubercle.  Atherosclerotic vascular calcifications.  IMPRESSION: No acute osseous abnormality of the left tibia /  fibula.   Original Report Authenticated By: Jearld Lesch, M.D.    Dg Tibia/fibula Right  01/05/2013  *RADIOLOGY REPORT*  Clinical Data: Fall, multiple abrasions/bruising to the right and left lower legs.  RIGHT TIBIA AND FIBULA - 2 VIEW  Comparison: Contralateral tibia / fibula  Findings: Right tibia / fibula radiographs show no displaced fracture.  Partially imaged degenerative changes of the knee joint. These views are not optimized to evaluate the joint spaces. Atherosclerotic vascular calcifications.  IMPRESSION:  No acute osseous abnormality of the right tibia / fibula.   Original Report Authenticated By: Jearld Lesch, M.D.    Ct Head Wo Contrast  01/05/2013  *RADIOLOGY REPORT*  Clinical Data:   Fall, on anticoagulation  CT HEAD WITHOUT CONTRAST CT CERVICAL SPINE WITHOUT CONTRAST  Technique:  Multidetector CT imaging of the head and cervical spine was performed following the standard protocol without intravenous contrast.  Multiplanar CT image reconstructions of the cervical spine were also generated.  Comparison:   None  CT HEAD  Findings: Prominence of the sulci, cisterns, and ventricles, in keeping with volume loss. There are subcortical and periventricular white matter hypodensities, a nonspecific finding most often seen with chronic microangiopathic changes.  There is no evidence for acute hemorrhage, overt hydrocephalus, mass lesion, or abnormal extra-axial fluid collection.  No definite CT evidence for acute cortical based (large artery) infarction. The visualized paranasal sinuses and mastoid air cells are predominately clear. Scattered atherosclerosis.  IMPRESSION: Volume loss and white matter changes, not unexpected for age.  No CT evidence of acute intracranial abnormality.  CT CERVICAL SPINE  Findings: Centrolobular emphysematous changes of the lung apices. Scattered atherosclerotic vascular calcifications.  Maintained craniocervical relationship.  No dens fracture.  Maintained vertebral body height and alignment.  Multilevel degenerative changes with uncovertebral joint hypertrophy, facet arthropathy, and disc osteophyte complexes resulting in mild central canal narrowing.  Paravertebral soft tissues within normal limits.  IMPRESSION: Multilevel degenerative changes.  No acute osseous abnormality of the cervical spine.   Original Report Authenticated By: Jearld Lesch, M.D.    Ct Cervical Spine Wo Contrast  01/05/2013  *RADIOLOGY REPORT*  Clinical Data:  Fall, on anticoagulation  CT HEAD WITHOUT CONTRAST CT CERVICAL SPINE WITHOUT CONTRAST  Technique:  Multidetector CT imaging of the head and cervical spine was performed following the standard protocol without intravenous contrast.   Multiplanar CT image reconstructions of the cervical spine were also generated.  Comparison:   None  CT HEAD  Findings: Prominence of the sulci, cisterns, and ventricles, in keeping with volume loss. There are subcortical and periventricular white matter hypodensities, a nonspecific finding most often seen with chronic microangiopathic changes.  There is no evidence for acute hemorrhage, overt hydrocephalus, mass lesion, or abnormal extra-axial fluid collection.  No definite CT evidence for acute cortical based (large artery) infarction. The visualized paranasal sinuses and mastoid air cells are predominately clear. Scattered atherosclerosis.  IMPRESSION: Volume loss and white matter changes, not unexpected for age.  No CT evidence of acute intracranial abnormality.  CT CERVICAL SPINE  Findings: Centrolobular emphysematous changes of the lung apices. Scattered atherosclerotic vascular calcifications.  Maintained craniocervical relationship.  No dens fracture.  Maintained vertebral body height and alignment.  Multilevel degenerative changes with uncovertebral joint hypertrophy, facet arthropathy, and disc osteophyte complexes resulting in mild central canal narrowing.  Paravertebral soft tissues within normal limits.  IMPRESSION: Multilevel degenerative changes.  No acute osseous abnormality of the cervical spine.   Original Report Authenticated By: Greig Castilla  DelGaizo, M.D.    ASSESSMENT:  1. Chronic atrial fibrillation with initial poor rate control on admission. Now controlled.  2. Prior history of chronic diastolic heart failure, LVEF 55% February 2013. Current status appears relatively stable from a volume standpoint, although BNP was markedly elevated above 12,000 on admission.  3. Diabetes mellitus  4. Chronic kidney disease, stage III  5. COPD  6. History of lung cancer status post left lung lobectomy  7. Mechanical fall prompting this admission. No clear evidence of syncope.  PLAN:  1. Continue rate  control of atrial fibrillation  2. Continue PO diuretics 3.  recommend a conference with family to align treatment goals and objectives given his age, frailty, and multiple medical problems.        Quintella Reichert, MD  01/07/2013  8:19 AM

## 2013-01-07 NOTE — Consult Note (Signed)
PULMONARY  / CRITICAL CARE MEDICINE  Name: Garrett Reese MRN: [id number] DOB: May 23, 1921    ADMISSION DATE:  01/05/2013 CONSULTATION DATE:  1-31  REFERRING MD :  Lavera Guise  CHIEF COMPLAINT:  SOB  BRIEF PATIENT DESCRIPTION:  79 yo with known O2 (x 5 years) COPD who is followed per Dr. Shelle Iron. He was admitted to [facility] 1[facility] after spending 8 days ar [facility] with pneumonia. Discharged home 1-29, fell getting out of the car and his wife brought him to [facility] that day.  SIGNIFICANT EVENTS / STUDIES:    LINES / TUBES:   CULTURES:   ANTIBIOTICS: 1-31 levaquin>> 1-31 maxipime>>  HISTORY OF PRESENT ILLNESS:  79 yo with known O2 (x 5 years) COPD who is followed per Dr. Shelle Iron. He was admitted to [facility] 1-29 after spending 8 days ar [facility] with pneumonia. Discharged home 1-29, fell getting out of the car and his wife brought him to [facility] that day. He has chronic ischemia of thee lower ext and has become non ambulatory and is scheduled for Aortogram, Left leg runoff and possible intervention per Dr Imogene Burn. PCCM is asked to evaluate.    PAST MEDICAL HISTORY :  Past Medical History  Diagnosis Date  . Dyslipidemia   . Hypertension   . Lung cancer   . DM type 2 (diabetes mellitus, type 2)   . COPD (chronic obstructive pulmonary disease)   . Atrial fibrillation   . CHF (congestive heart failure)   . CKD (chronic kidney disease)    Past Surgical History  Procedure Date  . Cholecystectomy   . Lung removal, partial     1991   Prior to Admission medications   Medication Sig Start Date End Date Taking? Authorizing Provider  albuterol (PROVENTIL HFA;VENTOLIN HFA) 108 (90 BASE) MCG/ACT inhaler Inhale 2 puffs into the lungs every 6 (six) hours as needed. 12/25/11  Yes Tammy S Parrett, NP  amoxicillin-clavulanate (AUGMENTIN) 500-125 MG per tablet Take 1 tablet by mouth 2 (two) times daily.   Yes Historical Provider, MD  aspirin 81 MG chewable tablet Chew 81 mg by mouth daily.   Yes Historical Provider,  MD  atorvastatin (LIPITOR) 20 MG tablet Take 20 mg by mouth daily.   Yes Historical Provider, MD  budesonide-formoterol (SYMBICORT) 160-4.5 MCG/ACT inhaler Inhale 2 puffs into the lungs 2 (two) times daily.     Yes Historical Provider, MD  fexofenadine (ALLEGRA) 180 MG tablet Take 180 mg by mouth daily as needed.     Yes Historical Provider, MD  glipiZIDE (GLUCOTROL XL) 2.5 MG 24 hr tablet Take 5 mg by mouth daily.   Yes Historical Provider, MD  guaiFENesin (MUCINEX) 600 MG 12 hr tablet Take 600 mg by mouth 2 (two) times daily.     Yes Historical Provider, MD  metoprolol (LOPRESSOR) 50 MG tablet Take 50 mg by mouth 2 (two) times daily.   Yes Historical Provider, MD  metoprolol (TOPROL-XL) 50 MG 24 hr tablet Take 75 mg by mouth daily.    Yes Historical Provider, MD  metroNIDAZOLE (METROCREAM) 0.75 % cream Apply 1 application topically 2 (two) times daily.   Yes Historical Provider, MD  OXYGEN-HELIUM IN Inhale 3 L/min into the lungs daily.   Yes Historical Provider, MD  potassium chloride (K-DUR) 10 MEQ tablet Take 10 mEq by mouth daily.   Yes Historical Provider, MD  tiotropium (SPIRIVA) 18 MCG inhalation capsule Place 18 mcg into inhaler and inhale daily.     Yes Historical Provider, MD  furosemide (  LASIX) 40 MG tablet Take 40 mg by mouth 4 (four) times daily.     Historical Provider, MD   Allergies  Allergen Reactions  . Captopril     FAMILY HISTORY:  Family History  Problem Relation Age of Onset  . Heart attack Father    SOCIAL HISTORY:  reports that he quit smoking about 33 years ago. His smoking use included Cigarettes. He has a 90 pack-year smoking history. He does not have any smokeless tobacco history on file. His alcohol and drug histories not on file.  REVIEW OF SYSTEMS:   Reviewed see HPI  SUBJECTIVE:   VITAL SIGNS: Temp:  [97 F (36.1 C)-97.5 F (36.4 C)] 97 F (36.1 C) (01/31 0600) Pulse Rate:  [73-79] 77  (01/31 0959) Resp:  [20] 20  (01/31 0600) BP:  (123-152)/(62-85) 128/62 mmHg (01/31 0959) SpO2:  [94 %-97 %] 94 % (01/31 0600)  PHYSICAL EXAMINATION: General:  Frail sickly elderly WM who is sob with any movement. Unable to turn himself in the bed Neuro: Follows commands. HEENT:  No LAN Neck:  No JVD Cardiovascular:  HSIR IR Afib Lungs:  Decreased air movement. No wheeze Abdomen: decreased bs Musculoskeletal:  intact Skin:  Lower ext with wraps . Poor circulation lowe ext   Lab 01/07/13 0542 01/06/13 0636 01/05/13 2121  NA 137 137 141  K 5.0 4.5 5.1  CL 94* 95* 95*  CO2 35* 32 35*  BUN 43* 39* 36*  CREATININE 1.67* 1.62* 1.63*  GLUCOSE 126* 125* 131*    Lab 01/07/13 0542 01/06/13 0636 01/05/13 2121  HGB 11.2* 10.0* 12.2*  HCT 35.2* 31.4* 37.9*  WBC 12.5* 10.9* 16.9*  PLT 216 179 225   Dg Chest 2 View  01/05/2013  *RADIOLOGY REPORT*  Clinical Data: Fall, chest pain.  CHEST - 2 VIEW  Comparison: 12/25/2011  Findings: Cardiomegaly.  Bibasilar opacities have increased in the interval.  In addition, increased interstitial markings.  Small effusions not excluded.  No pneumothorax.  Mediastinal contours unchanged.  No acute osseous finding.  IMPRESSION: Increased bibasilar opacities and interstitial markings.  May represent pneumonia or edema.   Original Report Authenticated By: Jearld Lesch, M.D.    Dg Tibia/fibula Left  01/05/2013  *RADIOLOGY REPORT*  Clinical Data: Fall, leg abrasions.  LEFT TIBIA AND FIBULA - 2 VIEW  Comparison: Contralateral tibia / fibula  Findings: Left tibia / fibula radiographs show no displaced fracture.  Partially imaged degenerative changes of the knee joint. These are not optimized evaluate the joint spaces.  Enthesopathic changes at the tibial tubercle.  Atherosclerotic vascular calcifications.  IMPRESSION: No acute osseous abnormality of the left tibia / fibula.   Original Report Authenticated By: Jearld Lesch, M.D.    Dg Tibia/fibula Right  01/05/2013  *RADIOLOGY REPORT*  Clinical Data:  Fall, multiple abrasions/bruising to the right and left lower legs.  RIGHT TIBIA AND FIBULA - 2 VIEW  Comparison: Contralateral tibia / fibula  Findings: Right tibia / fibula radiographs show no displaced fracture.  Partially imaged degenerative changes of the knee joint. These views are not optimized to evaluate the joint spaces. Atherosclerotic vascular calcifications.  IMPRESSION:  No acute osseous abnormality of the right tibia / fibula.   Original Report Authenticated By: Jearld Lesch, M.D.    Ct Head Wo Contrast  01/05/2013  *RADIOLOGY REPORT*  Clinical Data:  Fall, on anticoagulation  CT HEAD WITHOUT CONTRAST CT CERVICAL SPINE WITHOUT CONTRAST  Technique:  Multidetector CT imaging of the head and  cervical spine was performed following the standard protocol without intravenous contrast.  Multiplanar CT image reconstructions of the cervical spine were also generated.  Comparison:   None  CT HEAD  Findings: Prominence of the sulci, cisterns, and ventricles, in keeping with volume loss. There are subcortical and periventricular white matter hypodensities, a nonspecific finding most often seen with chronic microangiopathic changes.  There is no evidence for acute hemorrhage, overt hydrocephalus, mass lesion, or abnormal extra-axial fluid collection.  No definite CT evidence for acute cortical based (large artery) infarction. The visualized paranasal sinuses and mastoid air cells are predominately clear. Scattered atherosclerosis.  IMPRESSION: Volume loss and white matter changes, not unexpected for age.  No CT evidence of acute intracranial abnormality.  CT CERVICAL SPINE  Findings: Centrolobular emphysematous changes of the lung apices. Scattered atherosclerotic vascular calcifications.  Maintained craniocervical relationship.  No dens fracture.  Maintained vertebral body height and alignment.  Multilevel degenerative changes with uncovertebral joint hypertrophy, facet arthropathy, and disc osteophyte  complexes resulting in mild central canal narrowing.  Paravertebral soft tissues within normal limits.  IMPRESSION: Multilevel degenerative changes.  No acute osseous abnormality of the cervical spine.   Original Report Authenticated By: Jearld Lesch, M.D.    Ct Cervical Spine Wo Contrast  01/05/2013  *RADIOLOGY REPORT*  Clinical Data:  Fall, on anticoagulation  CT HEAD WITHOUT CONTRAST CT CERVICAL SPINE WITHOUT CONTRAST  Technique:  Multidetector CT imaging of the head and cervical spine was performed following the standard protocol without intravenous contrast.  Multiplanar CT image reconstructions of the cervical spine were also generated.  Comparison:   None  CT HEAD  Findings: Prominence of the sulci, cisterns, and ventricles, in keeping with volume loss. There are subcortical and periventricular white matter hypodensities, a nonspecific finding most often seen with chronic microangiopathic changes.  There is no evidence for acute hemorrhage, overt hydrocephalus, mass lesion, or abnormal extra-axial fluid collection.  No definite CT evidence for acute cortical based (large artery) infarction. The visualized paranasal sinuses and mastoid air cells are predominately clear. Scattered atherosclerosis.  IMPRESSION: Volume loss and white matter changes, not unexpected for age.  No CT evidence of acute intracranial abnormality.  CT CERVICAL SPINE  Findings: Centrolobular emphysematous changes of the lung apices. Scattered atherosclerotic vascular calcifications.  Maintained craniocervical relationship.  No dens fracture.  Maintained vertebral body height and alignment.  Multilevel degenerative changes with uncovertebral joint hypertrophy, facet arthropathy, and disc osteophyte complexes resulting in mild central canal narrowing.  Paravertebral soft tissues within normal limits.  IMPRESSION: Multilevel degenerative changes.  No acute osseous abnormality of the cervical spine.   Original Report Authenticated By:  Jearld Lesch, M.D.       ASSESSMENT Acute on chronic respiratory failure - Multifactorial   Severe COPD (not presently wheezing)  Pulmonary edema pattern on CXR  Possible PNA  Mucus retention - improved with flutter valve  Severe PVD-  He would be a very poor candidate for any surgical intervention other than the most simple of procedures. Specifically, would be at very high risk for major vascular procedures  CAD/AFIB/CHF  Renal Insuff  His care by Dr Lucina Mellow al has been exemplary  PLAN/REC: -continue O2, BDs, ABX as ordered -Eval and mgmt of CV problems per vascular surgery and Cards  Recommend goals of care. He is very fragile and risks may not outweigh benefits of invasive procedures.    Dr Shelle Iron will stop by Monday. We will be available PRN this WE  Brett Canales Minor ACNP Adolph Pollack PCCM Pager [phone] till 3 pm If no answer page [phone] 01/07/2013, 2:29 PM     Billy Fischer, MD ; [facility] service Mobile [phone].  After 5:30 PM or weekends, call [phone]

## 2013-01-07 NOTE — Consult Note (Signed)
Patient ZO:XWRUEAV C Garrett Reese      DOB: 1921-05-02      WUJ:[id number]     Consult Note from the Palliative Medicine Team at [facility]    Consult Requested by: Dr Lavera Guise     PCP: Benita Stabile, MD Reason for Consultation:Goals of Care     Phone Number:[phone]  Assessment of patients Current state: Patient sitting up in bed, alert, oriented, verbal responses appropriate, appears comfortable. Respirations unlabored, oxygen on via nasal cannula. Daughter at bedside.  Spoke with physician and staff caring for patient, reviewed chart and proceeded to have Goals of care meeting with patient and daughter, Garrett Reese [phone]). We discussed the vascular surgeries recommendations available severe PAD of LLE. Since patient is not candidate for extensive surgical revascularization, due to his multiple co-morbidities. We had a detailed discussion about the 2 other options an endovascular intervention-Aortogram Left Leg runoff with possible intervention, which has potential for limited patency, and possible serious complications. We discussed the risks in great detail as indicated in Dr Nicky Pugh note. Also discussed the other option which would be palliative, involving mainly wound care, antibiotics as indicated, pain medications, and possible palliative amputation as compromise progresses. Patient and daughter both agreed that taking the chance with the vascular procedure would be better even in the context of the potential complications and minimal benefit, than the palliative approach. Patient has been living at home alone, daughter lives in very close proximity, patient stated he would be agreeable to go to SNF for rehabilitation after the procedure, with the hope of eventually returning home. I also introduced the possibility of Palliative care Services to follow patient if discharge to SNF is decided on, and they are in agreement with that.  Discussed the philosophy of palliative care and  hospice support and services provided. Informed daughter and patient that if his conditions regresses and decision for for comfort care is desired that hospice services can be called for eligibility evaluation. Also engaged in decision-making with patient and daughter regarding concepts of Medical Orders for Scope of Treatment pertaining to cardiac and pulmonary resuscitation, desire for acute future medical interventions for issues, the use of antibiotic therapy, intravenous hydration and continuing artificial tube feedings. A MOST form was completed, signed and placed on chart.   Goals of Care: 1.  Code Status:DNR/DNI   2. Scope of Treatment: 1. Vital Signs:routine  2. Respiratory/Oxygen: medications and oxygen supplementation as indicated 3. Nutritional Support/Tube Feeds: Patient would not want artificial tube feedings for nutrition 4. Antibiotics: as indicated for identified infections 5. Blood Products: as needed 6. IVF: for defined period of time as indicated 7. Review of Medications to be discontinued: no changes 8. Labs/CBG monitoring: as indicated 9. Telemetry: yes   4. Disposition: pending at this time, patient would consider discharge to SNF for rehabilitation if indicated, and would be agreeable to having Palliative care Services follow. Hopes for eventual transition back home.    3. Symptom Management:   1. Pain:Hydrocodone/Acetaminophen 5/325-one tab every 6 hours as needed, Tramadol 50 mg every 6 hours as needed-alternating doses of these medicationss daily controlling patient bilateral leg pain, describes as aching currently 2-3/10 which is tolerable 2. Bowel Regimen:Miralax ordered as needed daily 3. Fever:Tylenol as needed 4. Dyspnea: Albuterol inhaler 2 puffs every 6 hours as needed  4. Psychosocial:Daughter very teary during conversation-emotional support and empathy offered, given PMT phone contact. Informed daughter procedure is scheduled for 01/10/13 at 7:30p   5.  Spiritual:declined spiritual intervention  Patient Documents  Completed or Given: Document Given Completed  Advanced Directives Pkt    MOST  YES  DNR    Gone from My Sight    Hard Choices YES     Brief HPI: Patient is 79 yo WM with PMH HTN, NSCLC (s/p lobectomy 1991), DM, COPD, CHF, CKD, PAD. Admitted 01/05/13 LE leg wound-cellulitis and PNA   ROS: +LE pain,+ DOE, +appetite, denies fever, n/v, anxiety, depression, constipation    PMH:  Past Medical History  Diagnosis Date  . Dyslipidemia   . Hypertension   . Lung cancer   . DM type 2 (diabetes mellitus, type 2)   . COPD (chronic obstructive pulmonary disease)   . Atrial fibrillation   . CHF (congestive heart failure)   . CKD (chronic kidney disease)      PSH: Past Surgical History  Procedure Date  . Cholecystectomy   . Lung removal, partial     1991   I have reviewed the FH and SH and  If appropriate update it with new information. Allergies  Allergen Reactions  . Captopril    Scheduled Meds:   . aspirin  81 mg Oral Daily  . atorvastatin  20 mg Oral Daily  . budesonide-formoterol  2 puff Inhalation BID  . ceFEPime (MAXIPIME) IV  2 g Intravenous Q24H  . furosemide  80 mg Oral BID  . heparin  5,000 Units Subcutaneous Q8H  . levofloxacin (LEVAQUIN) IV  750 mg Intravenous Q48H  . metoprolol  50 mg Oral BID  . sodium chloride  3 mL Intravenous Q12H  . tiotropium  18 mcg Inhalation Daily   Continuous Infusions:  PRN Meds:.acetaminophen, acetaminophen, albuterol, guaiFENesin-dextromethorphan, HYDROcodone-acetaminophen, levalbuterol, traMADol    BP 128/62  Pulse 77  Temp 97 F (36.1 C) (Oral)  Resp 20  Ht 5\' 6"  (1.676 m)  Wt 89.359 kg (197 lb)  BMI 31.80 kg/m2  SpO2 94%   PPS:50%    Intake/Output Summary (Last 24 hours) at 01/07/13 1312 Last data filed at 01/07/13 1001  Gross per 24 hour  Intake    360 ml  Output    425 ml  Net    -65 ml   ZOX:WRUEA                        Physical  Exam:  General: Alert, oriented, responses appropriate HEENT:  Anicteric, buccal mucosa moist, no lesions Chest:  Coarse scattered rhonchi L >R, diminished at bases CVS: Irregular Abdomen:BS audible, rotund, no tenderness Ext: bilateral leg bandages, multiple stasis ulcers, LE brawny appearnce Neuro:alert, responses appropriate  Labs: CBC    Component Value Date/Time   WBC 12.5* 01/07/2013 0542   RBC 3.72* 01/07/2013 0542   HGB 11.2* 01/07/2013 0542   HCT 35.2* 01/07/2013 0542   PLT 216 01/07/2013 0542   MCV 94.6 01/07/2013 0542   MCH 30.1 01/07/2013 0542   MCHC 31.8 01/07/2013 0542   RDW 15.0 01/07/2013 0542   LYMPHSABS 0.3* 01/05/2013 2121   MONOABS 1.0 01/05/2013 2121   EOSABS 0.0 01/05/2013 2121   BASOSABS 0.0 01/05/2013 2121    BMET    Component Value Date/Time   NA 137 01/07/2013 0542   K 5.0 01/07/2013 0542   CL 94* 01/07/2013 0542   CO2 35* 01/07/2013 0542   GLUCOSE 126* 01/07/2013 0542   BUN 43* 01/07/2013 0542   CREATININE 1.67* 01/07/2013 0542   CALCIUM 9.4 01/07/2013 0542   GFRNONAA 35* 01/07/2013 0542   GFRAA 40*  01/07/2013 0542    CMP     Component Value Date/Time   NA 137 01/07/2013 0542   K 5.0 01/07/2013 0542   CL 94* 01/07/2013 0542   CO2 35* 01/07/2013 0542   GLUCOSE 126* 01/07/2013 0542   BUN 43* 01/07/2013 0542   CREATININE 1.67* 01/07/2013 0542   CALCIUM 9.4 01/07/2013 0542   PROT 7.8 01/05/2013 2121   ALBUMIN 2.8* 01/05/2013 2121   AST 88* 01/05/2013 2121   ALT 42 01/05/2013 2121   ALKPHOS 230* 01/05/2013 2121   BILITOT 1.0 01/05/2013 2121   GFRNONAA 35* 01/07/2013 0542   GFRAA 40* 01/07/2013 0542    Chest Xray Reviewed/Impressions:01/05/13 IMPRESSION:  Increased bibasilar opacities and interstitial markings. May  represent pneumonia or edema.  Time In Time Out Total Time Spent with Patient Total Overall Time  11:30a 1:00p 90 min 100 min    Greater than 50%  of this time was spent counseling and coordinating care related to the above assessment and  plan.   Freddie Breech, CNS-C Palliative Medicine Team [facility] Team Phone: [phone] Pager: [phone]

## 2013-01-07 NOTE — Progress Notes (Signed)
Pt talked about his upcoming surgery and the illness that brought him here. He said he is concerned for his daughter. Pt talked about his family a lot, but seemed to have peace about upcoming surgery on Monday. I will continue to follow pt and ck on his daughter Sheral Flow while he is in surgery. (Spoke w/SW Jodie about documents pt's daughter has to be notarized.)  Marjory Lies Chaplain

## 2013-01-07 NOTE — Progress Notes (Signed)
[facility] PROGRESS NOTE  ORRIS PERIN WJX:[id number] DOB: Jan 20, 1921 DOA: 01/05/2013 PCP: Benita Stabile, MD  Assessment/Plan: 1. Fall, possible syncope: Syncope doubted by history, but he ha spotential for having hemodinamically unstable cardiac arrhythmias, with result in hypotension  . Many falls in the preceeding months . PT evaluation recommended home health  2. Atrial fibrillation with RVR: patient was placed on  Iv diltiazem drip in the emergency department. He was switched to po metoprolol on 01/06/13 . 3. HCAP: Appears stable without tachypnea or increased oxygen dependence, however has leukocytosis. Empiric antibiotics,  4. CKD stage IV- : Baseline creatinine from [place] records is at 1.6. Patient did receive  IV fluids on the night of 01/06/13 when it was assumed he was having AKI . By the morning the patient was c/o dyspnea. Iv fluids were stopped. We did resume lasix at 80 mg po bid on 01/06/13.  5. Wounds bilateral lower extremities: Suspect arterial insufficiency. Check ABI.  6. COPD/chronic respiratory failure oxygen-dependent: Stable. Continue oxygen. Continues Spiriva, Symbicort, albuterol as needed. 7. Subacute encephalopathy: Etiology unclear. Discussed with daughter on 01/06/2013 and the daughter relates history of chronic cognitive deficits - no further w/u planned.  8. PVD - moderate to severe L > R . Appreciate thoughtful VVS consult by Dr. Imogene Burn.  Plan for aortogram on Monday 01/10/13  9. CHF acute on chronic diastolic - last echo 2013 at [place] office - repeat pending   Code Status: full Family Communication: daughter  Disposition Plan: home    Consultants:  [facility]   Pulm   VVS   Procedures:    Antibiotics: Cefepime levofloxacin  vanc   HPI/Subjective: C/o severe dyspnea, cough, unable to bring up sputum   Objective: Filed Vitals:   01/06/13 1400 01/06/13 2022 01/06/13 2100 01/07/13 0600  BP: 112/60  136/85 152/74  Pulse:  69  73 79  Temp: 97.7 F (36.5 C)  97.5 F (36.4 C) 97 F (36.1 C)  TempSrc:      Resp: 18  20 20   Height:      Weight:      SpO2: 96% 96% 97% 94%   Patient Vitals for the past 24 hrs:  BP Temp Pulse Resp SpO2  01/07/13 0600 152/74 mmHg 97 F (36.1 C) 79  20  94 %  01/06/13 2100 136/85 mmHg 97.5 F (36.4 C) 73  20  97 %  01/06/13 2022 - - - - 96 %  01/06/13 1400 112/60 mmHg 97.7 F (36.5 C) 69  18  96 %  01/06/13 1137 144/60 mmHg - 108  - -  01/06/13 0918 - - - - 95 %     Intake/Output Summary (Last 24 hours) at 01/07/13 0743 Last data filed at 01/06/13 1328  Gross per 24 hour  Intake      0 ml  Output    100 ml  Net   -100 ml   Filed Weights   01/06/13 0156  Weight: 89.359 kg (197 lb)    Exam:   General:  Alert, not oriented to situation   Cardiovascular: irreg irreg   Respiratory: rhonchi L>r, wheezes   Abdomen: obese , soft  LE - with peripheral cyanosis, wounds covered by gauze, pulses not palpable   Data Reviewed: Basic Metabolic Panel:  Lab 01/07/13 2130 01/06/13 0636 01/05/13 2121  NA 137 137 141  K 5.0 4.5 5.1  CL 94* 95* 95*  CO2 35* 32 35*  GLUCOSE 126* 125* 131*  BUN  43* 39* 36*  CREATININE 1.67* 1.62* 1.63*  CALCIUM 9.4 8.9 9.4  MG -- -- --  PHOS -- -- --   Liver Function Tests:  Lab 01/05/13 2121  AST 88*  ALT 42  ALKPHOS 230*  BILITOT 1.0  PROT 7.8  ALBUMIN 2.8*   No results found for this basename: LIPASE:5,AMYLASE:5 in the last 168 hours No results found for this basename: AMMONIA:5 in the last 168 hours CBC:  Lab 01/07/13 0542 01/06/13 0636 01/05/13 2121  WBC 12.5* 10.9* 16.9*  NEUTROABS -- -- 15.6*  HGB 11.2* 10.0* 12.2*  HCT 35.2* 31.4* 37.9*  MCV 94.6 94.0 94.8  PLT 216 179 225   Cardiac Enzymes:  Lab 01/05/13 2311  CKTOTAL --  CKMB --  CKMBINDEX --  TROPONINI <0.30   BNP (last 3 results)  Basename 01/05/13 2311  PROBNP 12233.0*   CBG:  Lab 01/06/13 2152 01/06/13 1627 01/06/13 1124 01/06/13 0736   GLUCAP 138* 150* 127* 104*    No results found for this or any previous visit (from the past 240 hour(s)).   Studies: Dg Chest 2 View  01/05/2013  *RADIOLOGY REPORT*  Clinical Data: Fall, chest pain.  CHEST - 2 VIEW  Comparison: 12/25/2011  Findings: Cardiomegaly.  Bibasilar opacities have increased in the interval.  In addition, increased interstitial markings.  Small effusions not excluded.  No pneumothorax.  Mediastinal contours unchanged.  No acute osseous finding.  IMPRESSION: Increased bibasilar opacities and interstitial markings.  May represent pneumonia or edema.   Original Report Authenticated By: Jearld Lesch, M.D.    Dg Tibia/fibula Left  01/05/2013  *RADIOLOGY REPORT*  Clinical Data: Fall, leg abrasions.  LEFT TIBIA AND FIBULA - 2 VIEW  Comparison: Contralateral tibia / fibula  Findings: Left tibia / fibula radiographs show no displaced fracture.  Partially imaged degenerative changes of the knee joint. These are not optimized evaluate the joint spaces.  Enthesopathic changes at the tibial tubercle.  Atherosclerotic vascular calcifications.  IMPRESSION: No acute osseous abnormality of the left tibia / fibula.   Original Report Authenticated By: Jearld Lesch, M.D.    Dg Tibia/fibula Right  01/05/2013  *RADIOLOGY REPORT*  Clinical Data: Fall, multiple abrasions/bruising to the right and left lower legs.  RIGHT TIBIA AND FIBULA - 2 VIEW  Comparison: Contralateral tibia / fibula  Findings: Right tibia / fibula radiographs show no displaced fracture.  Partially imaged degenerative changes of the knee joint. These views are not optimized to evaluate the joint spaces. Atherosclerotic vascular calcifications.  IMPRESSION:  No acute osseous abnormality of the right tibia / fibula.   Original Report Authenticated By: Jearld Lesch, M.D.    Ct Head Wo Contrast  01/05/2013  *RADIOLOGY REPORT*  Clinical Data:  Fall, on anticoagulation  CT HEAD WITHOUT CONTRAST CT CERVICAL SPINE WITHOUT  CONTRAST  Technique:  Multidetector CT imaging of the head and cervical spine was performed following the standard protocol without intravenous contrast.  Multiplanar CT image reconstructions of the cervical spine were also generated.  Comparison:   None  CT HEAD  Findings: Prominence of the sulci, cisterns, and ventricles, in keeping with volume loss. There are subcortical and periventricular white matter hypodensities, a nonspecific finding most often seen with chronic microangiopathic changes.  There is no evidence for acute hemorrhage, overt hydrocephalus, mass lesion, or abnormal extra-axial fluid collection.  No definite CT evidence for acute cortical based (large artery) infarction. The visualized paranasal sinuses and mastoid air cells are predominately clear. Scattered atherosclerosis.  IMPRESSION: Volume loss and white matter changes, not unexpected for age.  No CT evidence of acute intracranial abnormality.  CT CERVICAL SPINE  Findings: Centrolobular emphysematous changes of the lung apices. Scattered atherosclerotic vascular calcifications.  Maintained craniocervical relationship.  No dens fracture.  Maintained vertebral body height and alignment.  Multilevel degenerative changes with uncovertebral joint hypertrophy, facet arthropathy, and disc osteophyte complexes resulting in mild central canal narrowing.  Paravertebral soft tissues within normal limits.  IMPRESSION: Multilevel degenerative changes.  No acute osseous abnormality of the cervical spine.   Original Report Authenticated By: Jearld Lesch, M.D.    Ct Cervical Spine Wo Contrast  01/05/2013  *RADIOLOGY REPORT*  Clinical Data:  Fall, on anticoagulation  CT HEAD WITHOUT CONTRAST CT CERVICAL SPINE WITHOUT CONTRAST  Technique:  Multidetector CT imaging of the head and cervical spine was performed following the standard protocol without intravenous contrast.  Multiplanar CT image reconstructions of the cervical spine were also generated.   Comparison:   None  CT HEAD  Findings: Prominence of the sulci, cisterns, and ventricles, in keeping with volume loss. There are subcortical and periventricular white matter hypodensities, a nonspecific finding most often seen with chronic microangiopathic changes.  There is no evidence for acute hemorrhage, overt hydrocephalus, mass lesion, or abnormal extra-axial fluid collection.  No definite CT evidence for acute cortical based (large artery) infarction. The visualized paranasal sinuses and mastoid air cells are predominately clear. Scattered atherosclerosis.  IMPRESSION: Volume loss and white matter changes, not unexpected for age.  No CT evidence of acute intracranial abnormality.  CT CERVICAL SPINE  Findings: Centrolobular emphysematous changes of the lung apices. Scattered atherosclerotic vascular calcifications.  Maintained craniocervical relationship.  No dens fracture.  Maintained vertebral body height and alignment.  Multilevel degenerative changes with uncovertebral joint hypertrophy, facet arthropathy, and disc osteophyte complexes resulting in mild central canal narrowing.  Paravertebral soft tissues within normal limits.  IMPRESSION: Multilevel degenerative changes.  No acute osseous abnormality of the cervical spine.   Original Report Authenticated By: Jearld Lesch, M.D.     Scheduled Meds:    . aspirin  81 mg Oral Daily  . atorvastatin  20 mg Oral Daily  . budesonide-formoterol  2 puff Inhalation BID  . ceFEPime (MAXIPIME) IV  2 g Intravenous Q24H  . furosemide  80 mg Oral BID  . heparin  5,000 Units Subcutaneous Q8H  . levofloxacin (LEVAQUIN) IV  750 mg Intravenous Q48H  . metoprolol  50 mg Oral BID  . sodium chloride  3 mL Intravenous Q12H  . tiotropium  18 mcg Inhalation Daily   Continuous Infusions:    Principal Problem:  *Atrial fibrillation with rapid ventricular response Active Problems:  DYSLIPIDEMIA  HYPERTENSION  EMPHYSEMA, SEVERE  Chronic respiratory  failure  Fall  Syncope  HCAP (healthcare-associated pneumonia)  Acute renal failure  Lower extremity ulceration  Encephalopathy  Chronic diastolic heart failure  PVD (peripheral vascular disease)      Coretha Creswell  [facility] Pager [phone]. If 7PM-7AM, please contact night-coverage at [email or web address], password [facility] 01/07/2013, 7:43 AM  LOS: 2 days

## 2013-01-07 NOTE — Progress Notes (Signed)
ANTIBIOTIC CONSULT NOTE - Follow Up  Pharmacy Consult for Vancomycin Indication: pneumonia  Allergies  Allergen Reactions  . Captopril    Patient Measurements: Height: 5\' 6"  (167.6 cm) Weight: 197 lb (89.359 kg) IBW/kg (Calculated) : 63.8   Vital Signs: Temp: 98 F (36.7 C) (01/31 1425) Temp src: Oral (01/31 1425) BP: 106/61 mmHg (01/31 1425) Pulse Rate: 73  (01/31 1425) Intake/Output from previous day: 01/30 0701 - 01/31 0700 In: -  Out: 100 [Urine:100] Intake/Output from this shift: Total I/O In: 600 [P.O.:600] Out: 525 [Urine:525]  Labs:  [facility] 01/07/13 0542 01/06/13 0636 01/05/13 2121  WBC 12.5* 10.9* 16.9*  HGB 11.2* 10.0* 12.2*  PLT 216 179 225  LABCREA -- -- --  CREATININE 1.67* 1.62* 1.63*   Estimated Creatinine Clearance: 31.4 ml/min (by C-G formula based on Cr of 1.67).  Microbiology: No results found for this or any previous visit (from the past 720 hour(s)).  Medical History: Past Medical History  Diagnosis Date  . Dyslipidemia   . Hypertension   . Lung cancer   . DM type 2 (diabetes mellitus, type 2)   . COPD (chronic obstructive pulmonary disease)   . Atrial fibrillation   . CHF (congestive heart failure)   . CKD (chronic kidney disease)    Medications:  Scheduled:     . aspirin  81 mg Oral Daily  . atorvastatin  20 mg Oral Daily  . budesonide-formoterol  2 puff Inhalation BID  . ceFEPime (MAXIPIME) IV  2 g Intravenous Q24H  . furosemide  80 mg Oral BID  . heparin  5,000 Units Subcutaneous Q8H  . [COMPLETED] levofloxacin (LEVAQUIN) IV  750 mg Intravenous Q48H  . metoprolol  50 mg Oral BID  . sodium chloride  3 mL Intravenous Q12H  . tiotropium  18 mcg Inhalation Daily   Assessment: 79 yo who was admitted for PNA as well as LE leg wound-cellulitis. He has been receiving cefepime/levaquin for HCAP. Scr is elevated at baseline and his estimated clearance is ~ 1ml/min.   Will dose adjust his Vancomycin dosing to account for his  renal dysfunction.  GOAL: Vancomycin trough of 10-15 mcg/ml  Plan:  Begin Vancomycin 1000 mg IV every 24 hours Continue Levaquin 750mg  IV q48h  Continue Cefepime 2g IV q24 Monitor renal function and adjust as needed F/U culture data and de-escalate IV antibiotics  Garrett Reese, PharmD., MS Clinical Pharmacist Pager:  [phone] Thank you for allowing pharmacy to be part of this patients care team. 01/07/2013,4:18 PM

## 2013-01-08 LAB — CBC
Hemoglobin: 10.5 g/dL — ABNORMAL LOW (ref 13.0–17.0)
MCH: 30.4 pg (ref 26.0–34.0)
Platelets: 175 10*3/uL (ref 150–400)
RBC: 3.45 MIL/uL — ABNORMAL LOW (ref 4.22–5.81)
WBC: 10.6 10*3/uL — ABNORMAL HIGH (ref 4.0–10.5)

## 2013-01-08 LAB — GLUCOSE, CAPILLARY: Glucose-Capillary: 107 mg/dL — ABNORMAL HIGH (ref 70–99)

## 2013-01-08 LAB — BASIC METABOLIC PANEL
CO2: 38 mEq/L — ABNORMAL HIGH (ref 19–32)
GFR calc non Af Amer: 38 mL/min — ABNORMAL LOW (ref >90)
Glucose, Bld: 120 mg/dL — ABNORMAL HIGH (ref 70–99)
Potassium: 4.1 mEq/L (ref 3.5–5.1)
Sodium: 138 mEq/L (ref 135–145)

## 2013-01-08 MED ORDER — DEXTROSE 5 % IV SOLN
1.0000 g | INTRAVENOUS | Status: AC
  Administered 2013-01-09 – 2013-01-12 (×4): 1 g via INTRAVENOUS
  Filled 2013-01-08 (×4): qty 1

## 2013-01-08 MED ORDER — VANCOMYCIN HCL 1000 MG IV SOLR
750.0000 mg | INTRAVENOUS | Status: DC
  Administered 2013-01-08 – 2013-01-11 (×4): 750 mg via INTRAVENOUS
  Filled 2013-01-08 (×5): qty 750

## 2013-01-08 NOTE — Progress Notes (Signed)
Patient Name: Garrett Reese Date of Encounter: 01/08/2013    SUBJECTIVE:  He does have an and urgency to use the bathroom at the time of this evaluation. He complains of shortness of breath. This is slightly improved since admission. Lower extremity swelling has decreased.  TELEMETRY:  Atrial fib controlled ventricular response.: Filed Vitals:   01/07/13 2215 01/08/13 0500 01/08/13 0735 01/08/13 0949  BP: 131/64 128/64  110/64  Pulse: 75 78  82  Temp: 97.4 F (36.3 C) 97.4 F (36.3 C)    TempSrc:      Resp: 18 18    Height:      Weight:      SpO2: 95% 97% 95%     Intake/Output Summary (Last 24 hours) at 01/08/13 1031 Last data filed at 01/08/13 0830  Gross per 24 hour  Intake    900 ml  Output    675 ml  Net    225 ml   I/O, net: Since admission he has +750 cc LABS: Basic Metabolic Panel:  Basename 01/08/13 0500 01/07/13 0542  NA 138 137  K 4.1 5.0  CL 94* 94*  CO2 38* 35*  GLUCOSE 120* 126*  BUN 40* 43*  CREATININE 1.56* 1.67*  CALCIUM 8.9 9.4  MG -- --  PHOS -- --   CBC:  Basename 01/08/13 0500 01/07/13 0542 01/05/13 2121  WBC 10.6* 12.5* --  NEUTROABS -- -- 15.6*  HGB 10.5* 11.2* --  HCT 32.6* 35.2* --  MCV 94.5 94.6 --  PLT 175 216 --   Cardiac Enzymes:  Basename 01/05/13 2311  CKTOTAL --  CKMB --  CKMBINDEX --  TROPONINI <0.30    Radiology/Studies:  None today  Physical Exam: Blood pressure 110/64, pulse 82, temperature 97.4 F (36.3 C), temperature source Oral, resp. rate 18, height 5\' 6"  (1.676 m), weight 89.359 kg (197 lb), SpO2 95.00%. Weight change:    No rales are heard. No wheezing is heard. Decreased breath sounds at the bases.  Cardiac exam reveals an irregular rhythm otherwise unremarkable. The systolic murmur still  ASSESSMENT:  1. Atrial fibrillation with rate control  2. Chronic diastolic heart failure with euvolemic.  3. Elderly, frail, and multiple other medical problems including PVD, COPD with cor pulmonale,  history of lung cancer, chronic kidney disease, and diabetes.   Plan:  1. Continue maintenance diuresis  Signed, Lesleigh Noe 01/08/2013, 10:31 AM

## 2013-01-08 NOTE — Progress Notes (Addendum)
Pt was sleeping in bed and had 9 beats of V tach and then had 10 beats of V tach. Pt denied any CP or SOB. VS stable. Np on call made aware. No new orders received. Will cont to monitor pt.

## 2013-01-08 NOTE — Progress Notes (Signed)
Pt had a run off 10  V tach. Pt has no symptom, no CP or SOB. VS  are stable. Attending MD made aware. No new orders received. Will continue to monitor pt.

## 2013-01-08 NOTE — Progress Notes (Signed)
[facility] PROGRESS NOTE  Garrett Reese:[id number] DOB: 06-Apr-1921 DOA: 01/05/2013 PCP: Benita Stabile, MD  Assessment/Plan: 1. Fall, possible syncope: Syncope doubted by history, but he ha spotential for having hemodinamically unstable cardiac arrhythmias, with result in hypotension  . Many falls in the preceeding months . PT evaluation recommended home health  2. Atrial fibrillation with RVR: patient was placed on  Iv diltiazem drip in the emergency department. He was switched to po metoprolol on 01/06/13 . 3. HCAP: Appears stable without tachypnea or increased oxygen dependence, however has leukocytosis. Empiric antibiotics,  4. CKD stage IV- : Baseline creatinine from [place] cards records is at 1.6. Patient did receive  IV fluids on the night of 01/06/13 when it was assumed he was having AKI . By the morning the patient was c/o dyspnea. Iv fluids were stopped. We did resume lasix at 80 mg po bid on 01/06/13.  5. Wounds bilateral lower extremities: Suspect arterial insufficiency. Check ABI.  6. COPD/chronic respiratory failure oxygen-dependent: Stable. Continue oxygen. Continues Spiriva, Symbicort, albuterol as needed. 7. Subacute encephalopathy: Etiology unclear. Discussed with daughter on 01/06/2013 and the daughter relates history of chronic cognitive deficits - no further w/u planned.  8. PVD - moderate to severe L > R . Appreciate thoughtful VVS consult by Dr. Imogene Burn.  Plan for aortogram on Monday 01/10/13  9. CHF acute on chronic diastolic - last echo 2013 at [place] office - repeat pending   Code Status: full Family Communication: daughter  Disposition Plan: home    Consultants:  [facility] cardiology   Pulm   VVS   Procedures:  Echo  Study Conclusions  - Left ventricle: The cavity size was mildly dilated. There was mild focal basal hypertrophy of the septum. Systolic function was normal. The estimated ejection fraction was in the range of 50% to 55%. Cannot exclude  mild hypokinesis of the basalinferior myocardium. - Aortic valve: Moderate thickening and calcification, consistent with sclerosis. - Mitral valve: Mild regurgitation. - Left atrium: The atrium was mildly dilated. - Pulmonary arteries: PA peak pressure: 39mm Hg (S). Impressions:  - The right ventricular systolic pressure was increased consistent with mild pulmonary hypertension.  Vascular arterial dopplers ABI completed: Right ABI not calculated due to patient unable to withstand cuff inflation. Left ABI indicates moderate decrease in flow but may be falsely elevated.   RIGHT    LEFT     PRESSURE  WAVEFORM   PRESSURE  WAVEFORM   BRACHIAL  149  Triphasic  BRACHIAL  146  Triphasic   DP   Monophasic  DP   absent   AT    AT     PT   Biphasic  PT  100  Monophasic   PER    PER     GREAT TOE   wnl  GREAT TOE   flat     RIGHT  LEFT   ABI  Pressures not taken due to pain with inflation of cuff  0.67  Moderate decrease   Duplex imaging: Right: Diffuse irregular mild to moderate plaque throughout with no areas of significant stenosis. Waveforms triphasic or biphasic throughout. Left: Diffuse irregular moderate plaque throughout with significant stenosis in the mid and mid-distal femoral artery. Waveforms biphasic proximally and monophasic in the mid, distal femoral artery and popliteal artery.  Probable tibial vessel disease, left greater than right.  [name], [name], [name]  01/06/2013, 2:12 PM   Antibiotics: Cefepime levofloxacin  vanc   HPI/Subjective: Feels better   Objective: Filed Vitals:  01/07/13 2215 01/08/13 0500 01/08/13 0735 01/08/13 0949  BP: 131/64 128/64  110/64  Pulse: 75 78  82  Temp: 97.4 F (36.3 C) 97.4 F (36.3 C)    TempSrc:      Resp: 18 18    Height:      Weight:      SpO2: 95% 97% 95%    Patient Vitals for the past 24 hrs:  BP Temp Temp src Pulse Resp SpO2  01/08/13 0949 110/64 mmHg - - 82  - -  01/08/13 0735 - - - - - 95 %  01/08/13 0500 128/64  mmHg 97.4 F (36.3 C) - 78  18  97 %  01/07/13 2215 131/64 mmHg 97.4 F (36.3 C) - 75  18  95 %  01/07/13 2212 137/68 mmHg - - - - -  01/07/13 2017 - - - - - 94 %  01/07/13 1425 106/61 mmHg 98 F (36.7 C) Oral 73  18  96 %     Intake/Output Summary (Last 24 hours) at 01/08/13 1316 Last data filed at 01/08/13 1158  Gross per 24 hour  Intake    660 ml  Output    775 ml  Net   -115 ml   Filed Weights   01/06/13 0156  Weight: 89.359 kg (197 lb)    Exam:   General:  Alert, not oriented to situation   Cardiovascular: irreg irreg   Respiratory: rhonchi L>r, no further wheezing   Abdomen: obese , soft  LE - with peripheral cyanosis, wounds covered by gauze, pulses not palpable   Data Reviewed: Basic Metabolic Panel:  Lab 01/08/13 1610 01/07/13 0542 01/06/13 0636 01/05/13 2121  NA 138 137 137 141  K 4.1 5.0 4.5 5.1  CL 94* 94* 95* 95*  CO2 38* 35* 32 35*  GLUCOSE 120* 126* 125* 131*  BUN 40* 43* 39* 36*  CREATININE 1.56* 1.67* 1.62* 1.63*  CALCIUM 8.9 9.4 8.9 9.4  MG -- -- -- --  PHOS -- -- -- --   Liver Function Tests:  Lab 01/05/13 2121  AST 88*  ALT 42  ALKPHOS 230*  BILITOT 1.0  PROT 7.8  ALBUMIN 2.8*   No results found for this basename: LIPASE:5,AMYLASE:5 in the last 168 hours No results found for this basename: AMMONIA:5 in the last 168 hours CBC:  Lab 01/08/13 0500 01/07/13 0542 01/06/13 0636 01/05/13 2121  WBC 10.6* 12.5* 10.9* 16.9*  NEUTROABS -- -- -- 15.6*  HGB 10.5* 11.2* 10.0* 12.2*  HCT 32.6* 35.2* 31.4* 37.9*  MCV 94.5 94.6 94.0 94.8  PLT 175 216 179 225   Cardiac Enzymes:  Lab 01/05/13 2311  CKTOTAL --  CKMB --  CKMBINDEX --  TROPONINI <0.30   BNP (last 3 results)  Basename 01/05/13 2311  PROBNP 12233.0*   CBG:  Lab 01/08/13 1141 01/08/13 0756 01/07/13 2031 01/07/13 1644 01/07/13 1158  GLUCAP 137* 107* 136* 108* 133*    No results found for this or any previous visit (from the past 240 hour(s)).   Studies: No  results found.  Scheduled Meds:    . aspirin  81 mg Oral Daily  . atorvastatin  20 mg Oral Daily  . budesonide-formoterol  2 puff Inhalation BID  . ceFEPime (MAXIPIME) IV  1 g Intravenous Q24H  . furosemide  80 mg Oral BID  . heparin  5,000 Units Subcutaneous Q8H  . metoprolol  50 mg Oral BID  . sodium chloride  3 mL Intravenous Q12H  .  tiotropium  18 mcg Inhalation Daily  . vancomycin  750 mg Intravenous Q24H   Continuous Infusions:    Principal Problem:  *Atrial fibrillation with rapid ventricular response Active Problems:  DYSLIPIDEMIA  HYPERTENSION  EMPHYSEMA, SEVERE  Chronic respiratory failure  Fall  Syncope  HCAP (healthcare-associated pneumonia)  Acute renal failure  Lower extremity ulceration  Encephalopathy  Chronic diastolic heart failure  PVD (peripheral vascular disease)  DOE (dyspnea on exertion)  Pain, lower leg  Pulmonary edema  Acute-on-chronic respiratory failure      Garrett Reese  [facility] Pager [phone]. If 7PM-7AM, please contact night-coverage at [email or web address], password [facility] 01/08/2013, 1:16 PM  LOS: 3 days

## 2013-01-09 LAB — CBC
HCT: 34 % — ABNORMAL LOW (ref 39.0–52.0)
MCH: 29.7 pg (ref 26.0–34.0)
MCHC: 31.5 g/dL (ref 30.0–36.0)
MCV: 94.4 fL (ref 78.0–100.0)
RDW: 15.1 % (ref 11.5–15.5)
WBC: 9.3 10*3/uL (ref 4.0–10.5)

## 2013-01-09 LAB — BASIC METABOLIC PANEL
BUN: 33 mg/dL — ABNORMAL HIGH (ref 6–23)
CO2: 41 mEq/L (ref 19–32)
Chloride: 95 mEq/L — ABNORMAL LOW (ref 96–112)
Creatinine, Ser: 1.42 mg/dL — ABNORMAL HIGH (ref 0.50–1.35)
Glucose, Bld: 113 mg/dL — ABNORMAL HIGH (ref 70–99)

## 2013-01-09 LAB — GLUCOSE, CAPILLARY: Glucose-Capillary: 167 mg/dL — ABNORMAL HIGH (ref 70–99)

## 2013-01-09 LAB — URINALYSIS, ROUTINE W REFLEX MICROSCOPIC
Nitrite: NEGATIVE
Specific Gravity, Urine: 1.012 (ref 1.005–1.030)
Urobilinogen, UA: 0.2 mg/dL (ref 0.0–1.0)

## 2013-01-09 LAB — URINE MICROSCOPIC-ADD ON

## 2013-01-09 MED ORDER — DIPHENHYDRAMINE HCL 50 MG/ML IJ SOLN
12.5000 mg | Freq: Four times a day (QID) | INTRAMUSCULAR | Status: DC | PRN
Start: 2013-01-09 — End: 2013-01-12
  Administered 2013-01-09: 12.5 mg via INTRAVENOUS
  Filled 2013-01-09: qty 1

## 2013-01-09 MED ORDER — FUROSEMIDE 40 MG PO TABS
40.0000 mg | ORAL_TABLET | Freq: Every day | ORAL | Status: DC
  Administered 2013-01-10 – 2013-01-12 (×3): 40 mg via ORAL
  Filled 2013-01-09 (×3): qty 1

## 2013-01-09 MED ORDER — DIPHENHYDRAMINE HCL 50 MG/ML IJ SOLN
25.0000 mg | Freq: Every evening | INTRAMUSCULAR | Status: DC | PRN
  Administered 2013-01-09: 25 mg via INTRAVENOUS
  Filled 2013-01-09: qty 1

## 2013-01-09 NOTE — Progress Notes (Signed)
Pt had 6 beats of v-tach. Pt was asymptomatic & still sleeping. MD on call made aware. No new orders recieved at this time. Will continue to monitor the pt. Sanda Linger

## 2013-01-09 NOTE — Progress Notes (Signed)
Patient Name: Garrett Reese Date of Encounter: 01/09/2013    SUBJECTIVE: He feels stronger. His appetite is good. Dyspnea is improved.  TELEMETRY:  Atrial fib with control rate: Filed Vitals:   01/08/13 2141 01/08/13 2151 01/09/13 0504 01/09/13 0908  BP:  154/75 150/87   Pulse:   85   Temp:   97.6 F (36.4 C)   TempSrc:   Oral   Resp:   20   Height:      Weight:   89.268 kg (196 lb 12.8 oz)   SpO2: 95%  96% 95%    Intake/Output Summary (Last 24 hours) at 01/09/13 1012 Last data filed at 01/09/13 0725  Gross per 24 hour  Intake   1115 ml  Output   1850 ml  Net   -735 ml    LABS: Basic Metabolic Panel:  Basename 01/09/13 0555 01/08/13 0500  NA 141 138  K 3.3* 4.1  CL 95* 94*  CO2 41* 38*  GLUCOSE 113* 120*  BUN 33* 40*  CREATININE 1.42* 1.56*  CALCIUM 9.1 8.9  MG -- --  PHOS -- --   CBC:  Basename 01/09/13 0555 01/08/13 0500  WBC 9.3 10.6*  NEUTROABS -- --  HGB 10.7* 10.5*  HCT 34.0* 32.6*  MCV 94.4 94.5  PLT 196 175   ECHO: Study Conclusions  - Left ventricle: The cavity size was mildly dilated. There was mild focal basal hypertrophy of the septum. Systolic function was normal. The estimated ejection fraction was in the range of 50% to 55%. Cannot exclude mild hypokinesis of the basalinferior myocardium. - Aortic valve: Moderate thickening and calcification, consistent with sclerosis. - Mitral valve: Mild regurgitation. - Left atrium: The atrium was mildly dilated. - Pulmonary arteries: PA peak pressure: 39mm Hg (S).    Radiology/Studies:  No new data  Physical Exam: Blood pressure 150/87, pulse 85, temperature 97.6 F (36.4 C), temperature source Oral, resp. rate 20, height 5\' 6"  (1.676 m), weight 89.268 kg (196 lb 12.8 oz), SpO2 95.00%. Weight change:    Irregularly irregular rhythm  2 of 6 apical systolic murmur  ASSESSMENT:  1. Chronic atrial fibrillation with rate control  2. Chronic diastolic heart failure, stable  3.  Hypokalemia  Plan:  1. Heart failure-wise the patient is stable  2. Replete potassium  Signed, Lesleigh Noe 01/09/2013, 10:12 AM

## 2013-01-09 NOTE — Progress Notes (Signed)
ANTIBIOTIC CONSULT NOTE - INITIAL  Pharmacy Consult for cefepime/vanc Indication: pneumonia/cellulitis  Allergies  Allergen Reactions  . Captopril     Patient Measurements: Height: 5\' 6"  (167.6 cm) Weight: 196 lb 12.8 oz (89.268 kg) IBW/kg (Calculated) : 63.8   Vital Signs: Temp: 97.6 F (36.4 C) (02/02 0504) Temp src: Oral (02/02 0504) BP: 150/87 mmHg (02/02 0504) Pulse Rate: 85  (02/02 0504) Intake/Output from previous day: 02/01 0701 - 02/02 0700 In: 1475 [P.O.:1475] Out: 1700 [Urine:1700] Intake/Output from this shift: Total I/O In: -  Out: 150 [Urine:150]  Labs:  [facility] 01/09/13 0555 01/08/13 0500 01/07/13 0542  WBC 9.3 10.6* 12.5*  HGB 10.7* 10.5* 11.2*  PLT 196 175 216  LABCREA -- -- --  CREATININE 1.42* 1.56* 1.67*   Estimated Creatinine Clearance: 36.9 ml/min (by C-G formula based on Cr of 1.42). No results found for this basename: VANCOTROUGH:2,VANCOPEAK:2,VANCORANDOM:2,GENTTROUGH:2,GENTPEAK:2,GENTRANDOM:2,TOBRATROUGH:2,TOBRAPEAK:2,TOBRARND:2,AMIKACINPEAK:2,AMIKACINTROU:2,AMIKACIN:2, in the last 72 hours   Microbiology: No results found for this or any previous visit (from the past 720 hour(s)).  Medical History: Past Medical History  Diagnosis Date  . Dyslipidemia   . Hypertension   . Lung cancer   . DM type 2 (diabetes mellitus, type 2)   . COPD (chronic obstructive pulmonary disease)   . Atrial fibrillation   . CHF (congestive heart failure)   . CKD (chronic kidney disease)     Medications:  Scheduled:     . aspirin  81 mg Oral Daily  . atorvastatin  20 mg Oral Daily  . budesonide-formoterol  2 puff Inhalation BID  . ceFEPime (MAXIPIME) IV  1 g Intravenous Q24H  . furosemide  80 mg Oral BID  . heparin  5,000 Units Subcutaneous Q8H  . metoprolol  50 mg Oral BID  . sodium chloride  3 mL Intravenous Q12H  . tiotropium  18 mcg Inhalation Daily  . vancomycin  750 mg Intravenous Q24H   Assessment: 79 yo who was admitted for PNA. Pt  has been on cefepime for HCAP. Vanc added a few days ago for lower extremity cellulitis. Scr has been trending down. Abx dose is appropriate.  Plan:  Cont vanc 750mg  IV q24 Cont cefepime 1g IV q24

## 2013-01-09 NOTE — Progress Notes (Signed)
[facility] PROGRESS NOTE  Garrett Reese ZOX:[id number] DOB: 01-11-1921 DOA: 01/05/2013 PCP: Garrett Stabile, MD  Assessment/Plan: 1. Fall, possible syncope: Syncope doubted by history, but he ha spotential for having hemodinamically unstable cardiac arrhythmias, with result in hypotension  . Many falls in the preceeding months . PT evaluation recommended home health  2. Atrial fibrillation with RVR: patient was placed on  Iv diltiazem drip in the emergency department. He was switched to po metoprolol on 01/06/13 . 3. HCAP: Appears stable without tachypnea or increased oxygen dependence, however has leukocytosis. Empiric antibiotics,  4. CKD stage IV- : Baseline creatinine from [place] cards records is at 1.6. Patient did receive  IV fluids on the night of 01/06/13 when it was assumed he was having AKI . By the morning the patient was c/o dyspnea. Iv fluids were stopped. We did place patient on Lasix at 80 mg po bid on 01/06/13. By 01/09/13 patient was deemed euvolemic and we did reduce lasix to 40 mg daily.   5. Wounds bilateral lower extremities: - due to PVD 6. COPD/chronic respiratory failure oxygen-dependent: Stable. Continue oxygen. Continues Spiriva, Symbicort, albuterol as needed. 7. Subacute encephalopathy: Etiology unclear. Discussed with daughter on 01/06/2013 and the daughter relates history of chronic cognitive deficits - no further w/u planned.  8. PVD - moderate to severe L > R . Appreciate thoughtful VVS consult by Garrett Reese.  Plan for aortogram on Monday 01/10/13  9. CHF acute on chronic diastolic - last echo 2013 at [place] office - repeat pending   Code Status: full Family Communication: daughter  Disposition Plan: home    Consultants:  [facility] cardiology   Pulm   VVS   Procedures:  Echo  Study Conclusions  - Left ventricle: The cavity size was mildly dilated. There was mild focal basal hypertrophy of the septum. Systolic function was normal. The estimated  ejection fraction was in the range of 50% to 55%. Cannot exclude mild hypokinesis of the basalinferior myocardium. - Aortic valve: Moderate thickening and calcification, consistent with sclerosis. - Mitral valve: Mild regurgitation. - Left atrium: The atrium was mildly dilated. - Pulmonary arteries: PA peak pressure: 39mm Hg (S). Impressions:  - The right ventricular systolic pressure was increased consistent with mild pulmonary hypertension.  Vascular arterial dopplers ABI completed: Right ABI not calculated due to patient unable to withstand cuff inflation. Left ABI indicates moderate decrease in flow but may be falsely elevated.   RIGHT    LEFT     PRESSURE  WAVEFORM   PRESSURE  WAVEFORM   BRACHIAL  149  Triphasic  BRACHIAL  146  Triphasic   DP   Monophasic  DP   absent   AT    AT     PT   Biphasic  PT  100  Monophasic   PER    PER     GREAT TOE   wnl  GREAT TOE   flat     RIGHT  LEFT   ABI  Pressures not taken due to pain with inflation of cuff  0.67  Moderate decrease   Duplex imaging: Right: Diffuse irregular mild to moderate plaque throughout with no areas of significant stenosis. Waveforms triphasic or biphasic throughout. Left: Diffuse irregular moderate plaque throughout with significant stenosis in the mid and mid-distal femoral artery. Waveforms biphasic proximally and monophasic in the mid, distal femoral artery and popliteal artery.  Probable tibial vessel disease, left greater than right.  Reese, Garrett, [name]  01/06/2013, 2:12 PM  Antibiotics: Cefepime levofloxacin  vanc   HPI/Subjective: Feels better   Objective: Filed Vitals:   01/08/13 2141 01/08/13 2151 01/09/13 0504 01/09/13 0908  BP:  154/75 150/87   Pulse:   85   Temp:   97.6 F (36.4 C)   TempSrc:   Oral   Resp:   20   Height:      Weight:   89.268 kg (196 lb 12.8 oz)   SpO2: 95%  96% 95%   Patient Vitals for the past 24 hrs:  BP Temp Temp src Pulse Resp SpO2 Weight  01/09/13 0908 - - -  - - 95 % -  01/09/13 0504 150/87 mmHg 97.6 F (36.4 C) Oral 85  20  96 % 89.268 kg (196 lb 12.8 oz)  01/08/13 2151 154/75 mmHg - - - - - -  01/08/13 2141 - - - - - 95 % -  01/08/13 2100 148/73 mmHg 98.5 F (36.9 C) Oral 87  20  96 % -  01/08/13 1400 135/60 mmHg 97.7 F (36.5 C) Oral 75  16  96 % -     Intake/Output Summary (Last 24 hours) at 01/09/13 1038 Last data filed at 01/09/13 0945  Gross per 24 hour  Intake   1765 ml  Output   1850 ml  Net    -85 ml   Filed Weights   01/06/13 0156 01/09/13 0504  Weight: 89.359 kg (197 lb) 89.268 kg (196 lb 12.8 oz)    Exam:   General:  Alert, not oriented to situation   Cardiovascular: irreg irreg   Respiratory: rhonchi L>r, no further wheezing   Abdomen: obese , soft  LE - with peripheral cyanosis, wounds covered by gauze, pulses not palpable   Data Reviewed: Basic Metabolic Panel:  Lab 01/09/13 1610 01/08/13 0500 01/07/13 0542 01/06/13 0636 01/05/13 2121  NA 141 138 137 137 141  K 3.3* 4.1 5.0 4.5 5.1  CL 95* 94* 94* 95* 95*  CO2 41* 38* 35* 32 35*  GLUCOSE 113* 120* 126* 125* 131*  BUN 33* 40* 43* 39* 36*  CREATININE 1.42* 1.56* 1.67* 1.62* 1.63*  CALCIUM 9.1 8.9 9.4 8.9 9.4  MG -- -- -- -- --  PHOS -- -- -- -- --   Liver Function Tests:  Lab 01/05/13 2121  AST 88*  ALT 42  ALKPHOS 230*  BILITOT 1.0  PROT 7.8  ALBUMIN 2.8*   No results found for this basename: LIPASE:5,AMYLASE:5 in the last 168 hours No results found for this basename: AMMONIA:5 in the last 168 hours CBC:  Lab 01/09/13 0555 01/08/13 0500 01/07/13 0542 01/06/13 0636 01/05/13 2121  WBC 9.3 10.6* 12.5* 10.9* 16.9*  NEUTROABS -- -- -- -- 15.6*  HGB 10.7* 10.5* 11.2* 10.0* 12.2*  HCT 34.0* 32.6* 35.2* 31.4* 37.9*  MCV 94.4 94.5 94.6 94.0 94.8  PLT 196 175 216 179 225   Cardiac Enzymes:  Lab 01/05/13 2311  CKTOTAL --  CKMB --  CKMBINDEX --  TROPONINI <0.30   BNP (last 3 results)  Basename 01/05/13 2311  PROBNP 12233.0*    CBG:  Lab 01/09/13 0740 01/08/13 1649 01/08/13 1141 01/08/13 0756 01/07/13 2031  GLUCAP 112* 147* 137* 107* 136*    No results found for this or any previous visit (from the past 240 hour(s)).   Studies: No results found.  Scheduled Meds:    . aspirin  81 mg Oral Daily  . atorvastatin  20 mg Oral Daily  . budesonide-formoterol  2  puff Inhalation BID  . ceFEPime (MAXIPIME) IV  1 g Intravenous Q24H  . furosemide  80 mg Oral BID  . heparin  5,000 Units Subcutaneous Q8H  . metoprolol  50 mg Oral BID  . sodium chloride  3 mL Intravenous Q12H  . tiotropium  18 mcg Inhalation Daily  . vancomycin  750 mg Intravenous Q24H   Continuous Infusions:    Principal Problem:  *Atrial fibrillation with rapid ventricular response Active Problems:  DYSLIPIDEMIA  HYPERTENSION  EMPHYSEMA, SEVERE  Chronic respiratory failure  Fall  Syncope  HCAP (healthcare-associated pneumonia)  Acute renal failure  Lower extremity ulceration  Encephalopathy  Chronic diastolic heart failure  PVD (peripheral vascular disease)  DOE (dyspnea on exertion)  Pain, lower leg  Pulmonary edema  Acute-on-chronic respiratory failure      Gray Doering  [facility] Pager [phone]. If 7PM-7AM, please contact night-coverage at [email or web address], password [facility] 01/09/2013, 10:38 AM  LOS: 4 days

## 2013-01-10 ENCOUNTER — Ambulatory Visit (HOSPITAL_COMMUNITY): Admit: 2013-01-10 | Payer: Self-pay | Admitting: Vascular Surgery

## 2013-01-10 ENCOUNTER — Encounter (HOSPITAL_COMMUNITY)
Admission: EM | Disposition: A | Discharge status: Skilled Nursing Facility | Payer: Self-pay | Source: Home / Self Care | Attending: Internal Medicine

## 2013-01-10 DIAGNOSIS — I739 Peripheral vascular disease, unspecified: Secondary | ICD-10-CM

## 2013-01-10 DIAGNOSIS — L98499 Non-pressure chronic ulcer of skin of other sites with unspecified severity: Secondary | ICD-10-CM

## 2013-01-10 LAB — GLUCOSE, CAPILLARY
Glucose-Capillary: 109 mg/dL — ABNORMAL HIGH (ref 70–99)
Glucose-Capillary: 161 mg/dL — ABNORMAL HIGH (ref 70–99)

## 2013-01-10 LAB — CBC
HCT: 33.6 % — ABNORMAL LOW (ref 39.0–52.0)
MCHC: 31.5 g/dL (ref 30.0–36.0)
Platelets: 190 10*3/uL (ref 150–400)
RDW: 15 % (ref 11.5–15.5)

## 2013-01-10 LAB — BASIC METABOLIC PANEL
CO2: 37 mEq/L — ABNORMAL HIGH (ref 19–32)
Chloride: 93 mEq/L — ABNORMAL LOW (ref 96–112)
Creatinine, Ser: 1.34 mg/dL (ref 0.50–1.35)

## 2013-01-10 LAB — CREATININE, SERUM: GFR calc non Af Amer: 44 mL/min — ABNORMAL LOW (ref >90)

## 2013-01-10 SURGERY — ANGIOGRAM EXTREMITY BILATERAL
Laterality: Bilateral

## 2013-01-10 MED ORDER — SODIUM CHLORIDE 0.9 % IV SOLN
1.0000 mL/kg/h | INTRAVENOUS | Status: AC
Start: 2013-01-10 — End: 2013-01-10

## 2013-01-10 MED ORDER — ONDANSETRON HCL 4 MG/2ML IJ SOLN: 4.0000 mg | Freq: Four times a day (QID) | INTRAMUSCULAR | Status: DC | PRN

## 2013-01-10 MED ORDER — ACETAMINOPHEN 325 MG PO TABS: 650.0000 mg | ORAL_TABLET | ORAL | Status: DC | PRN

## 2013-01-10 MED ORDER — PENTOSAN POLYSULFATE SODIUM 100 MG PO CAPS
100.0000 mg | ORAL_CAPSULE | Freq: Once | ORAL | Status: AC
  Administered 2013-01-10: 100 mg via ORAL
  Filled 2013-01-10: qty 1

## 2013-01-10 MED ORDER — ENOXAPARIN SODIUM 30 MG/0.3ML ~~LOC~~ SOLN
40.0000 mg | SUBCUTANEOUS | Status: DC
  Administered 2013-01-10 – 2013-01-11 (×2): 40 mg via SUBCUTANEOUS
  Filled 2013-01-10 (×3): qty 0.4

## 2013-01-10 MED ORDER — SODIUM CHLORIDE 0.9 % IV SOLN
INTRAVENOUS | Status: DC
  Administered 2013-01-10: 08:00:00 via INTRAVENOUS

## 2013-01-10 MED ORDER — PHENAZOPYRIDINE HCL 100 MG PO TABS
100.0000 mg | ORAL_TABLET | Freq: Once | ORAL | Status: AC
  Administered 2013-01-10: 100 mg via ORAL
  Filled 2013-01-10: qty 1

## 2013-01-10 MED ORDER — PHENAZOPYRIDINE HCL 200 MG PO TABS
200.0000 mg | ORAL_TABLET | Freq: Once | ORAL | Status: AC
  Administered 2013-01-10: 200 mg via ORAL
  Filled 2013-01-10: qty 1

## 2013-01-10 MED ORDER — LIDOCAINE HCL (PF) 1 % IJ SOLN
INTRAMUSCULAR | Status: AC
  Filled 2013-01-10: qty 30

## 2013-01-10 NOTE — Progress Notes (Signed)
PT Cancellation Note  Patient Details Name: BARLOW HARRISON MRN: [id number] DOB: April 07, 1921   Cancelled Treatment:    Reason Eval/Treat Not Completed: Other (comment) (pt refused OOB--waiting for Dr. Imogene Burn to discuss (stenting)) 01/10/2013  [place] Bing, PT [phone] (pager)    Alois Mincer, Eliseo Gum 01/10/2013, 5:25 PM

## 2013-01-10 NOTE — Progress Notes (Signed)
Patient had 12 beats of Vtach-2 normal beats and then 25 more beats of V-tach on telemetry. Patient was asymptomatic, BP 126/74 back in Afib HR 70-80's. Notified MD on call. No New orders received. Will continue to monitor. Earnest Conroy RN

## 2013-01-10 NOTE — Consult Note (Signed)
PULMONARY  / CRITICAL CARE MEDICINE  Name: Garrett Reese MRN: [id number] DOB: January 25, 1921    ADMISSION DATE:  01/05/2013 CONSULTATION DATE:  1-31  REFERRING MD :  Lavera Guise  CHIEF COMPLAINT:  SOB  BRIEF PATIENT DESCRIPTION:  79 yo with known O2 (x 5 years) COPD who is followed per Dr. Shelle Iron. He was admitted to [facility] 1-29 after spending 8 days ar [facility] with pneumonia. Discharged home 1-29, fell getting out of the car and his wife brought him to [facility] that day.  SIGNIFICANT EVENTS / STUDIES:    LINES / TUBES:   CULTURES:   ANTIBIOTICS: 1-31 levaquin>> 1-31 maxipime>>  HISTORY OF PRESENT ILLNESS:  79 yo with known O2 (x 5 years) COPD who is followed per Dr. Shelle Iron. He was admitted to [facility] 1-29 after spending 8 days ar [facility] with pneumonia. Discharged home 1-29, fell getting out of the car and his wife brought him to [facility] that day. He has chronic ischemia of thee lower ext and has become non ambulatory and is scheduled for Aortogram, Left leg runoff and possible intervention per Dr Imogene Burn. PCCM is asked to evaluate.    PAST MEDICAL HISTORY :  Past Medical History  Diagnosis Date  . Dyslipidemia   . Hypertension   . Lung cancer   . DM type 2 (diabetes mellitus, type 2)   . COPD (chronic obstructive pulmonary disease)   . Atrial fibrillation   . CHF (congestive heart failure)   . CKD (chronic kidney disease)    Past Surgical History  Procedure Date  . Cholecystectomy   . Lung removal, partial     1991   Prior to Admission medications   Medication Sig Start Date End Date Taking? Authorizing Provider  albuterol (PROVENTIL HFA;VENTOLIN HFA) 108 (90 BASE) MCG/ACT inhaler Inhale 2 puffs into the lungs every 6 (six) hours as needed. 12/25/11  Yes Tammy S Parrett, NP  amoxicillin-clavulanate (AUGMENTIN) 500-125 MG per tablet Take 1 tablet by mouth 2 (two) times daily.   Yes Historical Provider, MD  aspirin 81 MG chewable tablet Chew 81 mg by mouth daily.   Yes Historical Provider,  MD  atorvastatin (LIPITOR) 20 MG tablet Take 20 mg by mouth daily.   Yes Historical Provider, MD  budesonide-formoterol (SYMBICORT) 160-4.5 MCG/ACT inhaler Inhale 2 puffs into the lungs 2 (two) times daily.     Yes Historical Provider, MD  fexofenadine (ALLEGRA) 180 MG tablet Take 180 mg by mouth daily as needed.     Yes Historical Provider, MD  glipiZIDE (GLUCOTROL XL) 2.5 MG 24 hr tablet Take 5 mg by mouth daily.   Yes Historical Provider, MD  guaiFENesin (MUCINEX) 600 MG 12 hr tablet Take 600 mg by mouth 2 (two) times daily.     Yes Historical Provider, MD  metoprolol (LOPRESSOR) 50 MG tablet Take 50 mg by mouth 2 (two) times daily.   Yes Historical Provider, MD  metoprolol (TOPROL-XL) 50 MG 24 hr tablet Take 75 mg by mouth daily.    Yes Historical Provider, MD  metroNIDAZOLE (METROCREAM) 0.75 % cream Apply 1 application topically 2 (two) times daily.   Yes Historical Provider, MD  OXYGEN-HELIUM IN Inhale 3 L/min into the lungs daily.   Yes Historical Provider, MD  potassium chloride (K-DUR) 10 MEQ tablet Take 10 mEq by mouth daily.   Yes Historical Provider, MD  tiotropium (SPIRIVA) 18 MCG inhalation capsule Place 18 mcg into inhaler and inhale daily.     Yes Historical Provider, MD  furosemide (  LASIX) 40 MG tablet Take 40 mg by mouth 4 (four) times daily.     Historical Provider, MD   Allergies  Allergen Reactions  . Captopril     FAMILY HISTORY:  Family History  Problem Relation Age of Onset  . Heart attack Father    SOCIAL HISTORY:  reports that he quit smoking about 33 years ago. His smoking use included Cigarettes. He has a 90 pack-year smoking history. He does not have any smokeless tobacco history on file. His alcohol and drug histories not on file.  REVIEW OF SYSTEMS:   Reviewed see HPI  SUBJECTIVE:   VITAL SIGNS: Temp:  [97.6 F (36.4 C)-98.3 F (36.8 C)] 97.6 F (36.4 C) (02/03 0612) Pulse Rate:  [72-88] 72  (02/03 0815) Resp:  [18] 18  (02/03 0612) BP:  (117-147)/(64-99) 132/64 mmHg (02/03 0948) SpO2:  [92 %-98 %] 94 % (02/03 1014) FiO2 (%):  [2 %] 2 % (02/03 1014) Weight:  [90 kg (198 lb 6.6 oz)] 90 kg (198 lb 6.6 oz) (02/03 0612)  PHYSICAL EXAMINATION: General:  Frail sickly elderly WM who is sob with any movement. Unable to turn himself in the bed Neuro: Follows commands. HEENT:  No LAN Neck:  No JVD Cardiovascular:  HSIR IR Afib Lungs:  Decreased air movement. No wheeze Abdomen: decreased bs Musculoskeletal:  intact Skin:  Lower ext with wraps . Poor circulation lowe ext   Lab 01/10/13 0635 01/09/13 0555 01/08/13 0500  NA 138 141 138  K 3.5 3.3* 4.1  CL 93* 95* 94*  CO2 37* 41* 38*  BUN 31* 33* 40*  CREATININE 1.34 1.42* 1.56*  GLUCOSE 120* 113* 120*    Lab 01/09/13 0555 01/08/13 0500 01/07/13 0542  HGB 10.7* 10.5* 11.2*  HCT 34.0* 32.6* 35.2*  WBC 9.3 10.6* 12.5*  PLT 196 175 216   No results found.    ASSESSMENT Acute on chronic respiratory failure - Multifactorial   Severe COPD w recent but not active exacerbation  RLL atx, volume loss on CXR, Possible PNA (recently treated at OSH)  Mucus retention - improved with flutter valve  Severe PVD-  He would be a very poor candidate for any surgical intervention other than the most simple of procedures. Specifically, would be at very high risk for major vascular procedures; Vascular sgy following, suspect will manage conservatively  CAD/AFIB/CHF  Renal Insuff  Discussed the case with Dr Lavera Guise on 2/3, will notify Dr Shelle Iron that he is here, possibly check on him prior to discharge 2/4  PLAN/REC: -continue O2, BDs, ABX as ordered -Eval and mgmt of CV problems per vascular surgery and Cards  Recommend goals of care. He is very fragile and risks may not outweigh benefits of invasive procedures.   Levy Pupa, MD, PhD 01/10/2013, 10:30 AM [facility] [phone] or if no answer [phone]

## 2013-01-10 NOTE — Progress Notes (Signed)
Clinical Social Work Department BRIEF PSYCHOSOCIAL ASSESSMENT 01/10/2013  Patient:  Garrett Reese, Garrett Reese     Account Number:  [id number]     Admit date:  01/05/2013  Clinical Social Worker:  Robin Searing  Date/Time:  01/10/2013 12:04 PM  Referred by:  Physician  Date Referred:  01/10/2013 Referred for  SNF Placement   Other Referral:   Interview type:  Patient Other interview type:   Daughter at bedside as well as other family    PSYCHOSOCIAL DATA Living Status:  ALONE Admitted from facility:   Level of care:   Primary support name:  Corrie Dandy- daughter/POA Primary support relationship to patient:  FAMILY Degree of support available:   good    CURRENT CONCERNS Current Concerns  Post-Acute Placement   Other Concerns:    SOCIAL WORK ASSESSMENT / PLAN Met with patient and his family at bedside- they are open to ST SNF however patient is somewhat hesitant to commit- he is agreeable to considering a short SNF stay and gives me permission to begin SNF seach,   Assessment/plan status:  Other - See comment Other assessment/ plan:   FL2 and Pasarr completion   Information/referral to community resources:   SNF  EMS    PATIENT'S/FAMILY'S RESPONSE TO PLAN OF CARE: Patient and family are ok with SNF search in case patient needs this and in case he agrees (currently not committed to the idea)  Patient and family agree to SNF and I will advise onoffers and f.u with patient.     [name], MSW, [facility] [phone]

## 2013-01-10 NOTE — Op Note (Signed)
NAME: Garrett Reese   MRN: [id number] DOB: [birth year]    DATE OF OPERATION: 01/10/2013  PREOP DIAGNOSIS: peripheral vascular disease with nonhealing ulcers of both lower extremities  POSTOP DIAGNOSIS: same  PROCEDURE:  1. Ultrasound-guided access to the right common femoral artery 2. Selective catheterization of the left external iliac artery with left lower extremity runoff 3. Retrograde right femoral arteriogram with right lower extremity runoff  SURGEON: Di Kindle. Edilia Bo, MD, FACS  ASSIST: none  ANESTHESIA: local   EBL: minimal  INDICATIONS: [name] is a 79 y.o. male who presents with evidence of bilateral infrainguinal arterial occlusive disease and nonhealing ulcers. I was asked to proceed with arteriography in order to evaluate his infrainguinal arterial occlusive disease.  TECHNIQUE: The patient was brought to the peripheral vascular lab. Both groins were prepped and draped in usual sterile fashion. Under ultrasound guidance, and after the skin was anesthetized, the right common femoral artery was cannulated and a guidewire introduced into the infrarenal aorta under fluoroscopic control. A 5 French sheath was introduced over the wire. A crossover catheter was then advanced over the wire and then positioned into the proximal left common iliac artery. The wire was advanced into the external iliac artery and the crossover catheter exchanged for an endhole catheter. Selective left external iliac arteriogram was obtained with left lower extremity runoff next the endhole catheter was removed in a retrograde right femoral arteriogram was obtained through the right femoral sheath with right lower extremity runoff. At the completion of the procedure, the patient was transferred to the holding area for removal of the sheath.  FINDINGS:  1. On the left side, the common femoral and deep femoral artery are patent. There is moderate diffuse disease throughout the entire left  superficial femoral artery with a tight stenosis of 95% in the distal superficial femoral artery and a second 95% stenosis just above the patella in the popliteal artery. The anterior tibial artery on the left is occluded. There is 2 vessel runoff via the posterior tibial and peroneal arteries with some moderate stenosis in the proximal peroneal artery on the left. 2. On the right side, the common femoral artery and external iliac arteries are patent. There is a stenosis in the proximal deep femoral artery. There is mild diffuse disease throughout the superficial femoral artery with a moderate stenosis at the adductor canal. The anterior tibial artery on the right is occluded. His a focal stenosis of the distal tibial peroneal trunk. There is 2 vessel runoff on the right via the peroneal and posterior tibial arteries.    Waverly Ferrari, MD, FACS [facility]  DATE OF DICTATION:   01/10/2013

## 2013-01-10 NOTE — Progress Notes (Signed)
SUBJECTIVE:  Denies SOB or chest pain  OBJECTIVE:   Vitals:   Filed Vitals:   01/10/13 0612 01/10/13 0815 01/10/13 0948 01/10/13 1014  BP: 121/70  132/64   Pulse: 86 72    Temp: 97.6 F (36.4 C)     TempSrc: Oral     Resp: 18     Height:      Weight: 90 kg (198 lb 6.6 oz)     SpO2: 96%  92% 94%   I&O's:   Intake/Output Summary (Last 24 hours) at 01/10/13 1701 Last data filed at 01/10/13 8295  Gross per 24 hour  Intake      0 ml  Output    700 ml  Net   -700 ml   TELEMETRY: Reviewed telemetry pt in atrial fibrillatio:     PHYSICAL EXAM General: Well developed, well nourished, in no acute distress Head: Eyes PERRLA, No xanthomas.   Normal cephalic and atramatic  Lungs:   Clear bilaterally to auscultation and percussion. Heart:   Irregularly irregularS1 S2 Pulses are 2+ & equal. Abdomen: Bowel sounds are positive, abdomen soft and non-tender without masses  Extremities:   No clubbing, cyanosis or edema.  DP +1 Neuro: Alert and oriented X 3. Psych:  Good affect, responds appropriately   LABS: Basic Metabolic Panel:  Basename 01/10/13 0635 01/09/13 0555  NA 138 141  K 3.5 3.3*  CL 93* 95*  CO2 37* 41*  GLUCOSE 120* 113*  BUN 31* 33*  CREATININE 1.341.36* 1.42*  CALCIUM 8.9 9.1  MG -- --  PHOS -- --   Liver Function Tests: No results found for this basename: AST:2,ALT:2,ALKPHOS:2,BILITOT:2,PROT:2,ALBUMIN:2 in the last 72 hours No results found for this basename: LIPASE:2,AMYLASE:2 in the last 72 hours CBC:  Basename 01/10/13 0635 01/09/13 0555  WBC 10.7* 9.3  NEUTROABS -- --  HGB 10.6* 10.7*  HCT 33.6* 34.0*  MCV 94.4 94.4  PLT 190 196   Coag Panel:   Lab Results  Component Value Date   INR 1.32 01/05/2013   INR 1.2 RATIO 03/11/2007    RADIOLOGY: Dg Chest 2 View  01/05/2013  *RADIOLOGY REPORT*  Clinical Data: Fall, chest pain.  CHEST - 2 VIEW  Comparison: 12/25/2011  Findings: Cardiomegaly.  Bibasilar opacities have increased in the interval.  In  addition, increased interstitial markings.  Small effusions not excluded.  No pneumothorax.  Mediastinal contours unchanged.  No acute osseous finding.  IMPRESSION: Increased bibasilar opacities and interstitial markings.  May represent pneumonia or edema.   Original Report Authenticated By: Jearld Lesch, M.D.    Dg Tibia/fibula Left  01/05/2013  *RADIOLOGY REPORT*  Clinical Data: Fall, leg abrasions.  LEFT TIBIA AND FIBULA - 2 VIEW  Comparison: Contralateral tibia / fibula  Findings: Left tibia / fibula radiographs show no displaced fracture.  Partially imaged degenerative changes of the knee joint. These are not optimized evaluate the joint spaces.  Enthesopathic changes at the tibial tubercle.  Atherosclerotic vascular calcifications.  IMPRESSION: No acute osseous abnormality of the left tibia / fibula.   Original Report Authenticated By: Jearld Lesch, M.D.    Dg Tibia/fibula Right  01/05/2013  *RADIOLOGY REPORT*  Clinical Data: Fall, multiple abrasions/bruising to the right and left lower legs.  RIGHT TIBIA AND FIBULA - 2 VIEW  Comparison: Contralateral tibia / fibula  Findings: Right tibia / fibula radiographs show no displaced fracture.  Partially imaged degenerative changes of the knee joint. These views are not optimized to evaluate the joint spaces.  Atherosclerotic vascular calcifications.  IMPRESSION:  No acute osseous abnormality of the right tibia / fibula.   Original Report Authenticated By: Jearld Lesch, M.D.    Ct Head Wo Contrast  01/05/2013  *RADIOLOGY REPORT*  Clinical Data:  Fall, on anticoagulation  CT HEAD WITHOUT CONTRAST CT CERVICAL SPINE WITHOUT CONTRAST  Technique:  Multidetector CT imaging of the head and cervical spine was performed following the standard protocol without intravenous contrast.  Multiplanar CT image reconstructions of the cervical spine were also generated.  Comparison:   None  CT HEAD  Findings: Prominence of the sulci, cisterns, and ventricles, in  keeping with volume loss. There are subcortical and periventricular white matter hypodensities, a nonspecific finding most often seen with chronic microangiopathic changes.  There is no evidence for acute hemorrhage, overt hydrocephalus, mass lesion, or abnormal extra-axial fluid collection.  No definite CT evidence for acute cortical based (large artery) infarction. The visualized paranasal sinuses and mastoid air cells are predominately clear. Scattered atherosclerosis.  IMPRESSION: Volume loss and white matter changes, not unexpected for age.  No CT evidence of acute intracranial abnormality.  CT CERVICAL SPINE  Findings: Centrolobular emphysematous changes of the lung apices. Scattered atherosclerotic vascular calcifications.  Maintained craniocervical relationship.  No dens fracture.  Maintained vertebral body height and alignment.  Multilevel degenerative changes with uncovertebral joint hypertrophy, facet arthropathy, and disc osteophyte complexes resulting in mild central canal narrowing.  Paravertebral soft tissues within normal limits.  IMPRESSION: Multilevel degenerative changes.  No acute osseous abnormality of the cervical spine.   Original Report Authenticated By: Jearld Lesch, M.D.    Ct Cervical Spine Wo Contrast  01/05/2013  *RADIOLOGY REPORT*  Clinical Data:  Fall, on anticoagulation  CT HEAD WITHOUT CONTRAST CT CERVICAL SPINE WITHOUT CONTRAST  Technique:  Multidetector CT imaging of the head and cervical spine was performed following the standard protocol without intravenous contrast.  Multiplanar CT image reconstructions of the cervical spine were also generated.  Comparison:   None  CT HEAD  Findings: Prominence of the sulci, cisterns, and ventricles, in keeping with volume loss. There are subcortical and periventricular white matter hypodensities, a nonspecific finding most often seen with chronic microangiopathic changes.  There is no evidence for acute hemorrhage, overt hydrocephalus,  mass lesion, or abnormal extra-axial fluid collection.  No definite CT evidence for acute cortical based (large artery) infarction. The visualized paranasal sinuses and mastoid air cells are predominately clear. Scattered atherosclerosis.  IMPRESSION: Volume loss and white matter changes, not unexpected for age.  No CT evidence of acute intracranial abnormality.  CT CERVICAL SPINE  Findings: Centrolobular emphysematous changes of the lung apices. Scattered atherosclerotic vascular calcifications.  Maintained craniocervical relationship.  No dens fracture.  Maintained vertebral body height and alignment.  Multilevel degenerative changes with uncovertebral joint hypertrophy, facet arthropathy, and disc osteophyte complexes resulting in mild central canal narrowing.  Paravertebral soft tissues within normal limits.  IMPRESSION: Multilevel degenerative changes.  No acute osseous abnormality of the cervical spine.   Original Report Authenticated By: Jearld Lesch, M.D.    ASSESSMENT:  1. Chronic atrial fibrillation with rate control  2. Chronic diastolic heart failure, stable  3. Hypokalemia - repleted Plan:  1. Heart failure-wise the patient is stable  2. No new recs - please call with any questions 3.  Please have patient followup with me in office in 2 weeks     Quintella Reichert, MD  01/10/2013  5:01 PM

## 2013-01-10 NOTE — Clinical Social Work Placement (Signed)
     Clinical Social Work Department CLINICAL SOCIAL WORK PLACEMENT NOTE 01/10/2013  Patient:  Garrett Reese, Garrett Reese  Account Number:  [id number] Admit date:  01/05/2013  Clinical Social Worker:  Robin Searing  Date/time:  01/10/2013 12:09 PM  Clinical Social Work is seeking post-discharge placement for this patient at the following level of care:   SKILLED NURSING   (*CSW will update this form in Epic as items are completed)   01/10/2013  Patient/family provided with [facility] Department of Clinical Social Works list of facilities offering this level of care within the geographic area requested by the patient (or if unable, by the patients family).  01/10/2013  Patient/family informed of their freedom to choose among providers that offer the needed level of care, that participate in Medicare, Medicaid or managed care program needed by the patient, have an available bed and are willing to accept the patient.  01/10/2013  Patient/family informed of [facility] ownership interest in [facility], as well as of the fact that they are under no obligation to receive care at this facility.  PASARR submitted to EDS on 01/10/2013 PASARR number received from [employer] on 01/10/2013  FL2 transmitted to all facilities in geographic area requested by pt/family on  01/10/2013 FL2 transmitted to all facilities within larger geographic area on   Patient informed that his/her managed care company has contracts with or will negotiate with  certain facilities, including the following:     Patient/family informed of bed offers received:   Patient chooses bed at  Physician recommends and patient chooses bed at    Patient to be transferred to  on   Patient to be transferred to facility by   The following physician request were entered in Epic:   Additional Comments:

## 2013-01-10 NOTE — H&P (Signed)
Referred by:  Dr. Lavera Guise [facility])  Reason for referral: bilateral leg ischemia  History of Present Illness  Garrett Reese is a 79 y.o. (1921/09/04) male who presents with chief complaint: "bump on my right leg". Parts of patient's history are not consistent with the EMR, suggesting some confusion in this patient. Onset of symptom occurred by report over one month ago. Pt notes developing a blister on right shin which was drained by his PCP. This wound worsened since then which eventually lead him to be admitted at [facility]. Prior to one month ago, he denies any problems with walking. He thought he was was able to ambulate without claudication. He denies any rest pain. He notes only mild pain in his shin with dressing changes. Atherosclerotic risk factors include: hyperlipidemia, HTN, and diabetes. He notes h/o CHF and chronic atrial fibrillation. He does not know if he has been on coumadin.  Past Medical History   Diagnosis  Date   .  Dyslipidemia    .  Hypertension    .  Lung cancer    .  DM type 2 (diabetes mellitus, type 2)    .  COPD (chronic obstructive pulmonary disease)    .  Atrial fibrillation    .  CHF (congestive heart failure)    .  CKD (chronic kidney disease)     Past Surgical History   Procedure  Date   .  Cholecystectomy    .  Lung removal, partial      1991    History    Social History   .  Marital Status:  Widowed     Spouse Name:  N/A     Number of Children:  N/A   .  Years of Education:  N/A    Occupational History   .  Not on file.    Social History Main Topics   .  Smoking status:  Former Smoker -- 2.0 packs/day for 45 years     Types:  Cigarettes     Quit date:  12/09/1979   .  Smokeless tobacco:  Not on file   .  Alcohol Use:  Not on file   .  Drug Use:  Not on file   .  Sexually Active:  Not on file    Other Topics  Concern   .  Not on file    Social History Narrative   .  No narrative on file    Family History   Problem  Relation   Age of Onset   .  Heart attack  Father     No current facility-administered medications on file prior to encounter.    Current Outpatient Prescriptions on File Prior to Encounter   Medication  Sig  Dispense  Refill   .  albuterol (PROVENTIL HFA;VENTOLIN HFA) 108 (90 BASE) MCG/ACT inhaler  Inhale 2 puffs into the lungs every 6 (six) hours as needed.  1 Inhaler  3   .  atorvastatin (LIPITOR) 20 MG tablet  Take 20 mg by mouth daily.     .  budesonide-formoterol (SYMBICORT) 160-4.5 MCG/ACT inhaler  Inhale 2 puffs into the lungs 2 (two) times daily.     .  fexofenadine (ALLEGRA) 180 MG tablet  Take 180 mg by mouth daily as needed.     Marland Kitchen  glipiZIDE (GLUCOTROL XL) 2.5 MG 24 hr tablet  Take 5 mg by mouth daily.     Marland Kitchen  guaiFENesin (MUCINEX) 600 MG 12 hr tablet  Take 600 mg by mouth 2 (two) times daily.     .  metoprolol (LOPRESSOR) 50 MG tablet  Take 50 mg by mouth 2 (two) times daily.     .  metoprolol (TOPROL-XL) 50 MG 24 hr tablet  Take 75 mg by mouth daily.     .  metroNIDAZOLE (METROCREAM) 0.75 % cream  Apply 1 application topically 2 (two) times daily.     .  OXYGEN-HELIUM IN  Inhale 3 L/min into the lungs daily.     .  potassium chloride (K-DUR) 10 MEQ tablet  Take 10 mEq by mouth daily.     Marland Kitchen  tiotropium (SPIRIVA) 18 MCG inhalation capsule  Place 18 mcg into inhaler and inhale daily.     .  furosemide (LASIX) 40 MG tablet  Take 40 mg by mouth 4 (four) times daily.      Allergies   Allergen  Reactions   .  Captopril     REVIEW OF SYSTEMS: (Positives checked otherwise negative)  CARDIOVASCULAR: [ ]  chest pain, [ ]  chest pressure, [x]  palpitations, [x]  shortness of breath when laying flat, [x]  shortness of breath with exertion, [ ]  pain in feet when walking, [ ]  pain in feet when laying flat, [ ]  history of blood clot in veins (DVT), [ ]  history of phlebitis, [x]  swelling in legs, [ ]  varicose veins  PULMONARY: [x]  cough, [ ]  asthma, [ ]  wheezing  NEUROLOGIC: [ ]  weakness in arms or legs, [ ]   numbness in arms or legs, [ ]  difficulty speaking or slurred speech, [ ]  temporary loss of vision in one eye, [ ]  dizziness  HEMATOLOGIC: [ ]  bleeding problems, [ ]  problems with blood clotting too easily  MUSCULOSKEL: [ ]  joint pain, [ ]  joint swelling  GASTROINTEST: [ ]  Vomiting blood, [ ]  Blood in stool  GENITOURINARY: [ ]  Burning with urination, [ ]  Blood in urine  PSYCHIATRIC: [ ]  history of major depression  INTEGUMENTARY: [ ]  rashes, [x]  ulcers  CONSTITUTIONAL: [ ]  fever, [ ]  chills  Physical Examination  Filed Vitals:    01/06/13 0500  01/06/13 0918  01/06/13 1137  01/06/13 1400   BP:  129/59   144/60  112/60   Pulse:  78   108  69   Temp:  98.2 F (36.8 C)    97.7 F (36.5 C)   TempSrc:       Resp:  18    18   Height:       Weight:       SpO2:  93%  95%   96%    Body mass index is 31.80 kg/(m^2).  General: Alert and orient to self and place, elderly, somewhat confused  Head: [place]/AT  Ear/Nose/Throat: Hearing grossly intact, nares w/o erythema or drainage, oropharynx w/o Erythema/Exudate  Eyes: PERRLA, EOMI  Neck: Supple, no nuchal rigidity, no palpable LAD  Pulmonary: Sym exp, good air movt, CTAB, no rhonchi, & wheezing, +rales  Cardiac: irregularly irregular rhythm, no Murmurs, rubs or gallops  Vascular:  Vessel  Right  Left   Radial  Palpable  Palpable   Ulnar  Faintly Palpable  Faintly Palpable   Brachial  Palpable  Palpable   Carotid  Palpable, without bruit  Palpable, without bruit   Aorta  Not palpable due pannus  N/A   Femoral  Faintly Palpable  Faintly Palpable   Popliteal  Faintly palpable  Faintly palpable   PT  Not Palpable  Not Palpable  DP  Not Palpable  Not Palpable   Gastrointestinal: taunt abdominal wall that was soft later in exam, NTND, -G/R, - HSM, - masses, - CVAT B, infraumbilical incision  Musculoskeletal: Limited exam: upper extremities 5/5, lower extremities 3-4/5 , intact B DF/PF, Bilateral patellar abrasions with denuded skin, B shin with  extensive denude area , L shin with ischemic appearance, cyanotic hue in R > L toes, R lower leg warm to proximal calf, L lower leg cool  Neurologic: Grossly CN intact (limited due to pt cooperations), Pain and light touch intact in extremities included feet, Motor exam as listed above  Psychiatric: Poor judgment , Mood & affect appropriate for pt's clinical situation, memory gaps demonstrated  Dermatologic: See M/S exam for extremity exam, extensive echymotic areas in arms with multiple scabs on arms  Lymph : No Cervical, Axillary, or Inguinal lymphadenopathy  Laboratory:  CBC:    Component  Value  Date/Time    WBC  10.9*  01/06/2013 0636    RBC  3.34*  01/06/2013 0636    HGB  10.0*  01/06/2013 0636    HCT  31.4*  01/06/2013 0636    PLT  179  01/06/2013 0636    MCV  94.0  01/06/2013 0636    MCH  29.9  01/06/2013 0636    MCHC  31.8  01/06/2013 0636    RDW  15.0  01/06/2013 0636    LYMPHSABS  0.3*  01/05/2013 2121    MONOABS  1.0  01/05/2013 2121    EOSABS  0.0  01/05/2013 2121    BASOSABS  0.0  01/05/2013 2121    BMP:    Component  Value  Date/Time    NA  137  01/06/2013 0636    K  4.5  01/06/2013 0636    CL  95*  01/06/2013 0636    CO2  32  01/06/2013 0636    GLUCOSE  125*  01/06/2013 0636    BUN  39*  01/06/2013 0636    CREATININE  1.62*  01/06/2013 0636    CALCIUM  8.9  01/06/2013 0636    GFRNONAA  36*  01/06/2013 0636    GFRAA  42*  01/06/2013 0636    LFT: Alk Phos 230, ALT 42, AST 88, Tbil 1.0, Alb 2.8  BNP 12233  Coagulation:  Lab Results   Component  Value  Date    INR  1.32  01/05/2013    INR  1.2 RATIO  03/11/2007    No results found for this basename: PTT    Radiology  Dg Chest 2 View  01/05/2013 *RADIOLOGY REPORT* Clinical Data: Fall, chest pain. CHEST - 2 VIEW Comparison: 12/25/2011 Findings: Cardiomegaly. Bibasilar opacities have increased in the interval. In addition, increased interstitial markings. Small effusions not excluded. No pneumothorax. Mediastinal contours unchanged. No  acute osseous finding. IMPRESSION: Increased bibasilar opacities and interstitial markings. May represent pneumonia or edema. Original Report Authenticated By: Jearld Lesch, M.D.  Dg Tibia/fibula Left  01/05/2013 *RADIOLOGY REPORT* Clinical Data: Fall, leg abrasions. LEFT TIBIA AND FIBULA - 2 VIEW Comparison: Contralateral tibia / fibula Findings: Left tibia / fibula radiographs show no displaced fracture. Partially imaged degenerative changes of the knee joint. These are not optimized evaluate the joint spaces. Enthesopathic changes at the tibial tubercle. Atherosclerotic vascular calcifications. IMPRESSION: No acute osseous abnormality of the left tibia / fibula. Original Report Authenticated By: Jearld Lesch, M.D.  Dg Tibia/fibula Right  01/05/2013 *RADIOLOGY REPORT* Clinical Data: Fall, multiple abrasions/bruising to the  right and left lower legs. RIGHT TIBIA AND FIBULA - 2 VIEW Comparison: Contralateral tibia / fibula Findings: Right tibia / fibula radiographs show no displaced fracture. Partially imaged degenerative changes of the knee joint. These views are not optimized to evaluate the joint spaces. Atherosclerotic vascular calcifications. IMPRESSION: No acute osseous abnormality of the right tibia / fibula. Original Report Authenticated By: Jearld Lesch, M.D.  Ct Head Wo Contrast  01/05/2013 *RADIOLOGY REPORT* Clinical Data: Fall, on anticoagulation CT HEAD WITHOUT CONTRAST CT CERVICAL SPINE WITHOUT CONTRAST Technique: Multidetector CT imaging of the head and cervical spine was performed following the standard protocol without intravenous contrast. Multiplanar CT image reconstructions of the cervical spine were also generated. Comparison: None CT HEAD Findings: Prominence of the sulci, cisterns, and ventricles, in keeping with volume loss. There are subcortical and periventricular white matter hypodensities, a nonspecific finding most often seen with chronic microangiopathic changes. There is no  evidence for acute hemorrhage, overt hydrocephalus, mass lesion, or abnormal extra-axial fluid collection. No definite CT evidence for acute cortical based (large artery) infarction. The visualized paranasal sinuses and mastoid air cells are predominately clear. Scattered atherosclerosis. IMPRESSION: Volume loss and white matter changes, not unexpected for age. No CT evidence of acute intracranial abnormality. CT CERVICAL SPINE Findings: Centrolobular emphysematous changes of the lung apices. Scattered atherosclerotic vascular calcifications. Maintained craniocervical relationship. No dens fracture. Maintained vertebral body height and alignment. Multilevel degenerative changes with uncovertebral joint hypertrophy, facet arthropathy, and disc osteophyte complexes resulting in mild central canal narrowing. Paravertebral soft tissues within normal limits. IMPRESSION: Multilevel degenerative changes. No acute osseous abnormality of the cervical spine. Original Report Authenticated By: Jearld Lesch, M.D.  Ct Cervical Spine Wo Contrast  01/05/2013 *RADIOLOGY REPORT* Clinical Data: Fall, on anticoagulation CT HEAD WITHOUT CONTRAST CT CERVICAL SPINE WITHOUT CONTRAST Technique: Multidetector CT imaging of the head and cervical spine was performed following the standard protocol without intravenous contrast. Multiplanar CT image reconstructions of the cervical spine were also generated. Comparison: None CT HEAD Findings: Prominence of the sulci, cisterns, and ventricles, in keeping with volume loss. There are subcortical and periventricular white matter hypodensities, a nonspecific finding most often seen with chronic microangiopathic changes. There is no evidence for acute hemorrhage, overt hydrocephalus, mass lesion, or abnormal extra-axial fluid collection. No definite CT evidence for acute cortical based (large artery) infarction. The visualized paranasal sinuses and mastoid air cells are predominately clear.  Scattered atherosclerosis. IMPRESSION: Volume loss and white matter changes, not unexpected for age. No CT evidence of acute intracranial abnormality. CT CERVICAL SPINE Findings: Centrolobular emphysematous changes of the lung apices. Scattered atherosclerotic vascular calcifications. Maintained craniocervical relationship. No dens fracture. Maintained vertebral body height and alignment. Multilevel degenerative changes with uncovertebral joint hypertrophy, facet arthropathy, and disc osteophyte complexes resulting in mild central canal narrowing. Paravertebral soft tissues within normal limits. IMPRESSION: Multilevel degenerative changes. No acute osseous abnormality of the cervical spine. Original Report Authenticated By: Jearld Lesch, M.D.   Non-Invasive Vascular Imaging  ABI (Date: 01/06/13)  RLE: moderate peripheral arterial disease  LLE: moderate to severe peripheral arterial disease Medical Decision Making  Garrett Reese is a 79 y.o. male who presents with: multiple active co-morbidities including afib, CHF, cognitive defects possible dementia, and bilateral critical limb ischemia with potential limb loss  Clearly, this patient is at risk for loss of his legs (L>R) with his pretibial wounds. He does not have threatened limbs so no emergent intervention is indicated. The issue is I doubt he can survive an extensive surgical  revascularization with his multiple co-morbidities.  Additionally, such a revascularization is not of value if he does not regain ambulatory status. In this pt, I doubt his long-term ability to continue walking. I'm not certain if the elevated alk phos is a reflection of repeated falls or if there is another dx including possible neoplasm. The echymosis in the arm could be explained by coumadin or aspirin but I don't see either agent listed, so I suspect a great deal of his dermatologic findings are due to repeat trauma  In this patient, I would emphasize local wound care.  Wound care has already left their recommendations.  As surgical revasscularization is not an option, the only other option is endovascular intervention despite the nephrotoxicity of the contrast dye. This can be attenuated with concomitant use of carbon dioxide as a contrast agent.  Given the limb threatening status of this patient, I recommend proceeding with an: Aortogram, Left leg runoff and possible intervention. I discussed with the patient the nature of angiographic procedures, especially the limited patencies of any endovascular intervention.  The patient is aware of that the risks of an angiographic procedure include but are not limited to: bleeding, infection, access site complications, renal failure, embolization, rupture of vessel, dissection, possible need for emergent surgical intervention, possible need for surgical procedures to treat the patient's pathology, and stroke and death.  The patient is going to have a discussion with his daughter prior to proceeding. At earliest, he could be scheduled for Monday, allowing for additional medical optimization over the weekend.  Another option is proceeding with a palliative approach consisting of local wound care, antibiotics as need, pain medications, and possible palliative amputation as needed. Hopefully, the patient and his family can come to a decision sometime over the weekend.  Thank you for allowing [place] to participate in this patient's care. Leonides Sake, MD  [facility]  Office: [phone]  Pager: [phone]  01/06/2013, 7:03 PM

## 2013-01-10 NOTE — Progress Notes (Signed)
[facility] PROGRESS NOTE  Garrett Reese ZOX:[id number] DOB: 1921-07-26 DOA: 01/05/2013 PCP: Benita Stabile, MD  Assessment/Plan: 1. Fall, possible syncope: Syncope doubted by history, but he ha spotential for having hemodinamically unstable cardiac arrhythmias, with result in hypotension  . Many falls in the preceeding months . PT evaluation recommended home health  2. Atrial fibrillation with RVR: patient was placed on  Iv diltiazem drip in the emergency department. He was switched to po metoprolol on 01/06/13 . 3. HCAP: Appears stable without tachypnea or increased oxygen dependence, however has leukocytosis. Empiric antibiotics, started on admission - day 5/7  4. CKD stage IV- : Baseline creatinine from [facility] is at 1.6. Patient did receive  IV fluids on the night of 01/06/13 when it was assumed he was having AKI . By the morning the patient was c/o dyspnea. Iv fluids were stopped. We did place patient on Lasix at 80 mg po bid on 01/06/13. By 01/09/13 patient was deemed euvolemic and we did reduce lasix to 40 mg daily.   5. Wounds bilateral lower extremities: - due to PVD 6. COPD/chronic respiratory failure oxygen-dependent: Stable. Continue oxygen. Continues Spiriva, Symbicort, albuterol as needed. 7. Subacute encephalopathy: Etiology unclear. Discussed with daughter on 01/06/2013 and the daughter relates history of chronic cognitive deficits - no further w/u planned.  8. PVD - moderate to severe L > R . Appreciate thoughtful VVS consult by Dr. Imogene Burn.  Aortogram on 01/10/13 with severe bilat PVD  - FINDINGS:  1. On the left side, the common femoral and deep femoral artery are patent. There is moderate diffuse disease throughout the entire left superficial femoral artery with a tight stenosis of 95% in the distal superficial femoral artery and a second 95% stenosis just above the patella in the popliteal artery. The anterior tibial artery on the left is occluded. There is 2  vessel runoff via the posterior tibial and peroneal arteries with some moderate stenosis in the proximal peroneal artery on the left.  2. On the right side, the common femoral artery and external iliac arteries are patent. There is a stenosis in the proximal deep femoral artery. There is mild diffuse disease throughout the superficial femoral artery with a moderate stenosis at the adductor canal. The anterior tibial artery on the right is occluded. His a focal stenosis of the distal tibial peroneal trunk. There is 2 vessel runoff on the right via the peroneal and posterior tibial arteries.  8. CHF acute on chronic diastolic - last echo 2013 at [place] office - repeat Echo in house this admission showed intact EF.   Code Status: full Family Communication: daughter  Disposition Plan: SNF     Consultants:  [facility]   Pulm   VVS   Procedures:  Echo  Study Conclusions  - Left ventricle: The cavity size was mildly dilated. There was mild focal basal hypertrophy of the septum. Systolic function was normal. The estimated ejection fraction was in the range of 50% to 55%. Cannot exclude mild hypokinesis of the basalinferior myocardium. - Aortic valve: Moderate thickening and calcification, consistent with sclerosis. - Mitral valve: Mild regurgitation. - Left atrium: The atrium was mildly dilated. - Pulmonary arteries: PA peak pressure: 39mm Hg (S). Impressions:  - The right ventricular systolic pressure was increased consistent with mild pulmonary hypertension.  Vascular arterial dopplers ABI completed: Right ABI not calculated due to patient unable to withstand cuff inflation. Left ABI indicates moderate decrease in flow but may be falsely elevated.  RIGHT    LEFT     PRESSURE  WAVEFORM   PRESSURE  WAVEFORM   BRACHIAL  149  Triphasic  BRACHIAL  146  Triphasic   DP   Monophasic  DP   absent   AT    AT     PT   Biphasic  PT  100  Monophasic   PER    PER     GREAT TOE   wnl   GREAT TOE   flat     RIGHT  LEFT   ABI  Pressures not taken due to pain with inflation of cuff  0.67  Moderate decrease   Duplex imaging: Right: Diffuse irregular mild to moderate plaque throughout with no areas of significant stenosis. Waveforms triphasic or biphasic throughout. Left: Diffuse irregular moderate plaque throughout with significant stenosis in the mid and mid-distal femoral artery. Waveforms biphasic proximally and monophasic in the mid, distal femoral artery and popliteal artery.  Probable tibial vessel disease, left greater than right.  [name], [name], [name]  01/06/2013, 2:12 PM   Antibiotics: Cefepime levofloxacin  vanc   HPI/Subjective: Just returned from procedure . Still reports no further dyspnea   Objective: Filed Vitals:   01/10/13 0612 01/10/13 0815 01/10/13 0948 01/10/13 1014  BP: 121/70  132/64   Pulse: 86 72    Temp: 97.6 F (36.4 C)     TempSrc: Oral     Resp: 18     Height:      Weight: 90 kg (198 lb 6.6 oz)     SpO2: 96%  92% 94%   Patient Vitals for the past 24 hrs:  BP Temp Temp src Pulse Resp SpO2 Weight  01/10/13 1014 - - - - - 94 % -  01/10/13 0948 132/64 mmHg - - - - 92 % -  01/10/13 0815 - - - 72  - - -  01/10/13 0612 121/70 mmHg 97.6 F (36.4 C) Oral 86  18  96 % 90 kg (198 lb 6.6 oz)  01/09/13 2130 147/99 mmHg - - 88  - - -  01/09/13 2100 147/79 mmHg 98.3 F (36.8 C) Oral 88  18  98 % -  01/09/13 1340 117/64 mmHg 98 F (36.7 C) Oral 88  18  97 % -     Intake/Output Summary (Last 24 hours) at 01/10/13 1210 Last data filed at 01/10/13 0612  Gross per 24 hour  Intake    720 ml  Output   1000 ml  Net   -280 ml   Filed Weights   01/06/13 0156 01/09/13 0504 01/10/13 0612  Weight: 89.359 kg (197 lb) 89.268 kg (196 lb 12.8 oz) 90 kg (198 lb 6.6 oz)    Exam:   General:  Alert, and oriented   Cardiovascular: irreg irreg   Respiratory: CTAB   Abdomen: obese , soft  LE - with peripheral cyanosis, wounds covered by gauze,  pulses not palpable   Data Reviewed: Basic Metabolic Panel:  Lab 01/10/13 4098 01/09/13 0555 01/08/13 0500 01/07/13 0542 01/06/13 0636  NA 138 141 138 137 137  K 3.5 3.3* 4.1 5.0 4.5  CL 93* 95* 94* 94* 95*  CO2 37* 41* 38* 35* 32  GLUCOSE 120* 113* 120* 126* 125*  BUN 31* 33* 40* 43* 39*  CREATININE 1.341.36* 1.42* 1.56* 1.67* 1.62*  CALCIUM 8.9 9.1 8.9 9.4 8.9  MG -- -- -- -- --  PHOS -- -- -- -- --   Liver Function Tests:  Lab  01/05/13 2121  AST 88*  ALT 42  ALKPHOS 230*  BILITOT 1.0  PROT 7.8  ALBUMIN 2.8*   No results found for this basename: LIPASE:5,AMYLASE:5 in the last 168 hours No results found for this basename: AMMONIA:5 in the last 168 hours CBC:  Lab 01/10/13 0635 01/09/13 0555 01/08/13 0500 01/07/13 0542 01/06/13 0636 01/05/13 2121  WBC 10.7* 9.3 10.6* 12.5* 10.9* --  NEUTROABS -- -- -- -- -- 15.6*  HGB 10.6* 10.7* 10.5* 11.2* 10.0* --  HCT 33.6* 34.0* 32.6* 35.2* 31.4* --  MCV 94.4 94.4 94.5 94.6 94.0 --  PLT 190 196 175 216 179 --   Cardiac Enzymes:  Lab 01/05/13 2311  CKTOTAL --  CKMB --  CKMBINDEX --  TROPONINI <0.30   BNP (last 3 results)  Basename 01/05/13 2311  PROBNP 12233.0*   CBG:  Lab 01/10/13 0856 01/09/13 2100 01/09/13 1707 01/09/13 1128 01/09/13 0740  GLUCAP 109* 150* 167* 162* 112*    No results found for this or any previous visit (from the past 240 hour(s)).   Studies: No results found.  Scheduled Meds:    . aspirin  81 mg Oral Daily  . atorvastatin  20 mg Oral Daily  . budesonide-formoterol  2 puff Inhalation BID  . ceFEPime (MAXIPIME) IV  1 g Intravenous Q24H  . enoxaparin  40 mg Subcutaneous Q24H  . furosemide  40 mg Oral Daily  . metoprolol  50 mg Oral BID  . sodium chloride  3 mL Intravenous Q12H  . tiotropium  18 mcg Inhalation Daily  . vancomycin  750 mg Intravenous Q24H   Continuous Infusions:    . sodium chloride 1 mL/kg/hr (01/10/13 0921)    Principal Problem:  *Atrial fibrillation with  rapid ventricular response Active Problems:  DYSLIPIDEMIA  HYPERTENSION  EMPHYSEMA, SEVERE  Chronic respiratory failure  Fall  Syncope  HCAP (healthcare-associated pneumonia)  Acute renal failure  Lower extremity ulceration  Encephalopathy  Chronic diastolic heart failure  PVD (peripheral vascular disease)  DOE (dyspnea on exertion)  Pain, lower leg  Pulmonary edema  Acute-on-chronic respiratory failure      Kieron Kantner  [facility] Pager [phone]. If 7PM-7AM, please contact night-coverage at [email or web address], password [facility] 01/10/2013, 12:10 PM  LOS: 5 days

## 2013-01-11 ENCOUNTER — Telehealth: Payer: Self-pay | Admitting: Vascular Surgery

## 2013-01-11 LAB — CBC
HCT: 33.8 % — ABNORMAL LOW (ref 39.0–52.0)
MCH: 30.2 pg (ref 26.0–34.0)
MCV: 93.6 fL (ref 78.0–100.0)
RBC: 3.61 MIL/uL — ABNORMAL LOW (ref 4.22–5.81)
WBC: 10.1 10*3/uL (ref 4.0–10.5)

## 2013-01-11 LAB — BASIC METABOLIC PANEL
CO2: 39 mEq/L — ABNORMAL HIGH (ref 19–32)
Chloride: 97 mEq/L (ref 96–112)
Creatinine, Ser: 1.22 mg/dL (ref 0.50–1.35)
Glucose, Bld: 127 mg/dL — ABNORMAL HIGH (ref 70–99)
Sodium: 141 mEq/L (ref 135–145)

## 2013-01-11 LAB — GLUCOSE, CAPILLARY
Glucose-Capillary: 177 mg/dL — ABNORMAL HIGH (ref 70–99)
Glucose-Capillary: 196 mg/dL — ABNORMAL HIGH (ref 70–99)
Glucose-Capillary: 164 mg/dL — ABNORMAL HIGH (ref 70–99)

## 2013-01-11 MED ORDER — PHENAZOPYRIDINE HCL 200 MG PO TABS: 200.0000 mg | ORAL_TABLET | Freq: Three times a day (TID) | ORAL | Status: AC | PRN

## 2013-01-11 MED ORDER — PHENAZOPYRIDINE HCL 200 MG PO TABS
200.0000 mg | ORAL_TABLET | Freq: Three times a day (TID) | ORAL | Status: DC
  Filled 2013-01-11: qty 1

## 2013-01-11 MED ORDER — TRAMADOL HCL 50 MG PO TABS: 50.0000 mg | ORAL_TABLET | Freq: Four times a day (QID) | ORAL | Status: AC | PRN

## 2013-01-11 MED ORDER — PHENAZOPYRIDINE HCL 200 MG PO TABS
200.0000 mg | ORAL_TABLET | Freq: Three times a day (TID) | ORAL | Status: DC | PRN
  Administered 2013-01-11 – 2013-01-12 (×4): 200 mg via ORAL
  Filled 2013-01-11 (×3): qty 1

## 2013-01-11 MED ORDER — FUROSEMIDE 40 MG PO TABS: 40.0000 mg | ORAL_TABLET | Freq: Every day | ORAL | Status: AC

## 2013-01-11 NOTE — Progress Notes (Signed)
SNF bed offers given to family at bedside- they are reviewing the options and awaiting further word from other SNFs- Advised them of likely dc tomorrow and encouraged them to do what they need to do for SNF bed selection. Patient in good spirits- more involved today with SNF discussion., [name], MSW, [facility] [phone]

## 2013-01-11 NOTE — Progress Notes (Signed)
[facility] PROGRESS NOTE  Garrett Reese:[id number] DOB: February 10, 1921 DOA: 01/05/2013 PCP: Benita Stabile, MD  Assessment/Plan: 1. Fall, possible syncope: Syncope doubted by history, but he has potential for having hemodinamically unstable cardiac arrhythmias, with result in hypotension  . Many falls in the preceeding months . PT evaluation recommended SNF. Was kept on telemetry without significant arrythmia noted. Echo with preserved EF. No episodes in hospital. No further w/u planned by cards  2. Atrial fibrillation with RVR: patient was placed on  Iv diltiazem drip in the emergency department. He was switched to po metoprolol on 01/06/13 . No further recurrence of RVR during admisssion. Not a coumadin candidate due to frequent falls.  3. HCAP: Appears stable without tachypnea or increased oxygen dependence, however has leukocytosis. Empiric antibiotics, started on admission and patient received 1 week of Vanc and Cefepime  4. CKD stage IV- : Baseline creatinine from [facility] is at 1.6. Patient did receive  IV fluids on the night of 01/06/13 when it was assumed he was having AKI . By the morning the patient was c/o dyspnea. Iv fluids were stopped. We did place patient on Lasix at 80 mg po bid on 01/06/13. By 01/09/13 patient was deemed euvolemic and we did reduce lasix to 40 mg daily.   5. Wounds bilateral lower extremities: - due to PVD 6. COPD/chronic respiratory failure oxygen-dependent: Stable. Continue oxygen. Continues Spiriva, Symbicort, albuterol as needed. 7. Subacute encephalopathy: Etiology unclear. Discussed with daughter on 01/06/2013 and the daughter relates history of chronic cognitive deficits - no further w/u planned.  8. PVD - moderate to severe L > R . Appreciate thoughtful VVS consult by Dr. Imogene Burn.  Aortogram on 01/10/13 with severe bilat PVD  - FINDINGS:  1. On the left side, the common femoral and deep femoral artery are patent. There is moderate diffuse  disease throughout the entire left superficial femoral artery with a tight stenosis of 95% in the distal superficial femoral artery and a second 95% stenosis just above the patella in the popliteal artery. The anterior tibial artery on the left is occluded. There is 2 vessel runoff via the posterior tibial and peroneal arteries with some moderate stenosis in the proximal peroneal artery on the left.  2. On the right side, the common femoral artery and external iliac arteries are patent. There is a stenosis in the proximal deep femoral artery. There is mild diffuse disease throughout the superficial femoral artery with a moderate stenosis at the adductor canal. The anterior tibial artery on the right is occluded. His a focal stenosis of the distal tibial peroneal trunk. There is 2 vessel runoff on the right via the peroneal and posterior tibial arteries.  Patient not a candidate for surgery so he will be continued on medical therapy. 8. CHF acute on chronic diastolic - last echo 2013 at [place] office - repeat Echo in house this admission showed intact EF.   Code Status: full Family Communication: daughter  Disposition Plan: SNF     Consultants:  [facility]   Pulm   VVS   Procedures:  Echo  Study Conclusions  - Left ventricle: The cavity size was mildly dilated. There was mild focal basal hypertrophy of the septum. Systolic function was normal. The estimated ejection fraction was in the range of 50% to 55%. Cannot exclude mild hypokinesis of the basalinferior myocardium. - Aortic valve: Moderate thickening and calcification, consistent with sclerosis. - Mitral valve: Mild regurgitation. - Left atrium: The atrium was mildly  dilated. - Pulmonary arteries: PA peak pressure: 39mm Hg (S). Impressions:  - The right ventricular systolic pressure was increased consistent with mild pulmonary hypertension.  Vascular arterial dopplers ABI completed: Right ABI not calculated due to  patient unable to withstand cuff inflation. Left ABI indicates moderate decrease in flow but may be falsely elevated.   RIGHT    LEFT     PRESSURE  WAVEFORM   PRESSURE  WAVEFORM   BRACHIAL  149  Triphasic  BRACHIAL  146  Triphasic   DP   Monophasic  DP   absent   AT    AT     PT   Biphasic  PT  100  Monophasic   PER    PER     GREAT TOE   wnl  GREAT TOE   flat     RIGHT  LEFT   ABI  Pressures not taken due to pain with inflation of cuff  0.67  Moderate decrease   Duplex imaging: Right: Diffuse irregular mild to moderate plaque throughout with no areas of significant stenosis. Waveforms triphasic or biphasic throughout. Left: Diffuse irregular moderate plaque throughout with significant stenosis in the mid and mid-distal femoral artery. Waveforms biphasic proximally and monophasic in the mid, distal femoral artery and popliteal artery.  Probable tibial vessel disease, left greater than right.  [name], [name], [name]  01/06/2013, 2:12 PM   Antibiotics: Cefepime levofloxacin  vanc   HPI/Subjective: No new events  Objective: Filed Vitals:   01/10/13 2016 01/10/13 2100 01/10/13 2210 01/11/13 0600  BP:  136/84 159/72 151/79  Pulse: 90 92 86 83  Temp:  98 F (36.7 C)  97.9 F (36.6 C)  TempSrc:  Oral    Resp: 18 19  22   Height:      Weight:      SpO2: 92% 96%  93%   Patient Vitals for the past 24 hrs:  BP Temp Temp src Pulse Resp SpO2  01/11/13 0600 151/79 mmHg 97.9 F (36.6 C) - 83  22  93 %  01/10/13 2210 159/72 mmHg - - 86  - -  01/10/13 2100 136/84 mmHg 98 F (36.7 C) Oral 92  19  96 %  01/10/13 2016 - - - 90  18  92 %  01/10/13 1014 - - - - - 94 %  01/10/13 0948 132/64 mmHg - - - - 92 %     Intake/Output Summary (Last 24 hours) at 01/11/13 0827 Last data filed at 01/11/13 1610  Gross per 24 hour  Intake      0 ml  Output   1100 ml  Net  -1100 ml   Filed Weights   01/06/13 0156 01/09/13 0504 01/10/13 0612  Weight: 89.359 kg (197 lb) 89.268 kg (196 lb 12.8 oz)  90 kg (198 lb 6.6 oz)    Exam:   General:  Alert, and oriented   Cardiovascular: irreg irreg   Respiratory: CTAB   Abdomen: obese , soft  LE - with peripheral cyanosis, wounds covered by gauze, pulses not palpable   Data Reviewed: Basic Metabolic Panel:  Lab 01/11/13 9604 01/10/13 0635 01/09/13 0555 01/08/13 0500 01/07/13 0542  NA 141 138 141 138 137  K 3.5 3.5 3.3* 4.1 5.0  CL 97 93* 95* 94* 94*  CO2 39* 37* 41* 38* 35*  GLUCOSE 127* 120* 113* 120* 126*  BUN 27* 31* 33* 40* 43*  CREATININE 1.22 1.341.36* 1.42* 1.56* 1.67*  CALCIUM 8.9 8.9 9.1 8.9 9.4  MG -- -- -- -- --  PHOS -- -- -- -- --   Liver Function Tests:  Lab 01/05/13 2121  AST 88*  ALT 42  ALKPHOS 230*  BILITOT 1.0  PROT 7.8  ALBUMIN 2.8*   No results found for this basename: LIPASE:5,AMYLASE:5 in the last 168 hours No results found for this basename: AMMONIA:5 in the last 168 hours CBC:  Lab 01/11/13 0545 01/10/13 0635 01/09/13 0555 01/08/13 0500 01/07/13 0542 01/05/13 2121  WBC 10.1 10.7* 9.3 10.6* 12.5* --  NEUTROABS -- -- -- -- -- 15.6*  HGB 10.9* 10.6* 10.7* 10.5* 11.2* --  HCT 33.8* 33.6* 34.0* 32.6* 35.2* --  MCV 93.6 94.4 94.4 94.5 94.6 --  PLT 182 190 196 175 216 --   Cardiac Enzymes:  Lab 01/05/13 2311  CKTOTAL --  CKMB --  CKMBINDEX --  TROPONINI <0.30   BNP (last 3 results)  Basename 01/05/13 2311  PROBNP 12233.0*   CBG:  Lab 01/11/13 0735 01/10/13 2144 01/10/13 1645 01/10/13 1131 01/10/13 0856  GLUCAP 131* 160* 161* 132* 109*    No results found for this or any previous visit (from the past 240 hour(s)).   Studies: No results found.  Scheduled Meds:    . aspirin  81 mg Oral Daily  . atorvastatin  20 mg Oral Daily  . budesonide-formoterol  2 puff Inhalation BID  . ceFEPime (MAXIPIME) IV  1 g Intravenous Q24H  . enoxaparin  40 mg Subcutaneous Q24H  . furosemide  40 mg Oral Daily  . metoprolol  50 mg Oral BID  . sodium chloride  3 mL Intravenous Q12H  .  tiotropium  18 mcg Inhalation Daily  . vancomycin  750 mg Intravenous Q24H   Continuous Infusions:    Principal Problem:  *Atrial fibrillation with rapid ventricular response Active Problems:  DYSLIPIDEMIA  HYPERTENSION  EMPHYSEMA, SEVERE  Chronic respiratory failure  Fall  Syncope  HCAP (healthcare-associated pneumonia)  Acute renal failure  Lower extremity ulceration  Encephalopathy  Chronic diastolic heart failure  PVD (peripheral vascular disease)  DOE (dyspnea on exertion)  Pain, lower leg  Pulmonary edema  Acute-on-chronic respiratory failure      Kenyan Karnes  [facility] Pager [phone]. If 7PM-7AM, please contact night-coverage at [email or web address], password [facility] 01/11/2013, 8:27 AM  LOS: 6 days

## 2013-01-11 NOTE — Progress Notes (Signed)
PT Cancellation Note  Patient Details Name: Garrett Reese MRN: [id number] DOB: [birth year]   Cancelled Treatment:    Reason Eval/Treat Not Completed: Other (comment) (pt refused this morning)--"This is the only rest I can get... No don't come back today... Will try back in the pm, as able,  see if pt will particpate. 01/11/2013  [name], PT [phone] (pager)    Mireyah Chervenak, Eliseo Gum 01/11/2013, 11:58 AM

## 2013-01-11 NOTE — Telephone Encounter (Addendum)
Message copied by Shari Prows on Tue Jan 11, 2013 12:22 PM ------      Message from: Melene Plan      Created: Tue Jan 11, 2013 11:39 AM                   ----- Message -----         From: Fransisco Hertz, MD         Sent: 01/11/2013  11:14 AM           To: Reuel Derby, Melene Plan, RN            Garrett Reese      [id number]      02-Mar-1921            Needs wound check in two weeks.  Will likely get d/c to SNF  I scheduled an appt for the above pt on 01/21/2013 at 9:15am w/ [facility]. I left a voicemail message for the pt and also mailed an appt letter.awt

## 2013-01-11 NOTE — Progress Notes (Addendum)
[facility]  Daily Progress Note  Assessment/Planning: POD #1 s/p Dx Angiogram   Based on the images, patient has inline flow to both feet.  Left leg has some compromise in the distal superficial femoral artery.  No intervention was undertaken due to risk of dissecting into left popliteal artery.  It is clear from the chart that this patient is a very high risk surgical candidate, so I would recommend at this point maximizing medical management and local wound care.  If the patient is not able to heal left leg, I would consider intervening on the left femoropopliteal segment, knowing the risks of possible dissection into that segment.  The blood flow in right leg should be adequate to heal that leg.  Patient can follow up in the office in two weeks ([id number]).  Subjective  - 1 Day Post-Op  Legs feel okay  Objective Filed Vitals:   01/10/13 2016 01/10/13 2100 01/10/13 2210 01/11/13 0600  BP:  136/84 159/72 151/79  Pulse: 90 92 86 83  Temp:  98 F (36.7 C)  97.9 F (36.6 C)  TempSrc:  Oral    Resp: 18 19  22   Height:      Weight:      SpO2: 92% 96%  93%    Intake/Output Summary (Last 24 hours) at 01/11/13 1108 Last data filed at 01/11/13 4782  Gross per 24 hour  Intake      0 ml  Output   1100 ml  Net  -1100 ml    PULM  CTAB CV  RRR GI  soft, NTND VASC  Both legs bandaged, feet unchanged  Laboratory CBC    Component Value Date/Time   WBC 10.1 01/11/2013 0545   HGB 10.9* 01/11/2013 0545   HCT 33.8* 01/11/2013 0545   PLT 182 01/11/2013 0545    BMET    Component Value Date/Time   NA 141 01/11/2013 0545   K 3.5 01/11/2013 0545   CL 97 01/11/2013 0545   CO2 39* 01/11/2013 0545   GLUCOSE 127* 01/11/2013 0545   BUN 27* 01/11/2013 0545   CREATININE 1.22 01/11/2013 0545   CALCIUM 8.9 01/11/2013 0545   GFRNONAA 51* 01/11/2013 0545   GFRAA 59* 01/11/2013 0545    Leonides Sake, MD [facility] Office: [phone] Pager:  [phone]  01/11/2013, 11:08 AM

## 2013-01-12 DIAGNOSIS — R0989 Other specified symptoms and signs involving the circulatory and respiratory systems: Secondary | ICD-10-CM

## 2013-01-12 DIAGNOSIS — R0609 Other forms of dyspnea: Secondary | ICD-10-CM

## 2013-01-12 DIAGNOSIS — M79609 Pain in unspecified limb: Secondary | ICD-10-CM

## 2013-01-12 DIAGNOSIS — I10 Essential (primary) hypertension: Secondary | ICD-10-CM

## 2013-01-12 LAB — BASIC METABOLIC PANEL
CO2: 38 mEq/L — ABNORMAL HIGH (ref 19–32)
Chloride: 96 mEq/L (ref 96–112)
Sodium: 141 mEq/L (ref 135–145)

## 2013-01-12 LAB — GLUCOSE, CAPILLARY: Glucose-Capillary: 111 mg/dL — ABNORMAL HIGH (ref 70–99)

## 2013-01-12 NOTE — Progress Notes (Signed)
Patient for d/c today to SNF bed at [facility]. Family and patient agreeable to this plan- plan transfer via daughter's car. [name], MSW, Theresia Majors [phone]

## 2013-01-12 NOTE — Progress Notes (Signed)
Patient Garrett Reese Garrett Reese      DOB: 1921/05/22      WUJ:[id number]   Palliative Medicine Team at [facility] Progress Note    Subjective:Patient alert, comfortable , having intermittent L leg discomfort, controlled with current as needed pain medications. Patients goal is to discharge to SNF for rehabilitation, with eventual return home. Discussed the possibility of Palliative care services following at SNF, patient doesn't feel services are necessary. Also left message for daughter [name], if there were any needs from PMT to contact us via phone.   Filed Vitals:   01/12/13 1043  BP: 136/61  Pulse: 89  Temp:   Resp:    Physical exam: General: Alert, oriented  HEENT: conjuntivae clear, oral mucosa moist CHEST: CTA bilaterally diminished at bases NFA:[id number] rate/rhythm  ABD: rotund, BS audible ION:[id number] leg dressings,multiple stasis ulcers, brawny appearance of LE skin  Assessment and plan: Patient is 79 yo WM with PMH HTN, NSCLC (s/p lobectomy 1991), DM, COPD, CHF, CKD, PAD. Admitted 01/05/13 LE wound cellulitis and PNA. Dyspnea improved todat, chest CTA compared to 01/07/13. S/P Angiogram on 01/11/13 for possible intervention procedure of L LE PAD-no intervention completed due to risk for dissection into L popliteal artery. Patient to f/u with Vascular S[facility] 2 weeks post discharge wound care and SNF for rehabilitation.  1) Code Status: Full measures desired per Goals of care discussion  2) LE Pain: current regime with Tramadol and Vicodin as needed effective per patient for pain control  3) Dyspnea: improved, controlled with as needed nebulizer treatments and treatment for PNA  4) Disposition: plan is for discharge to SNF/rehabilitation-patient declined need for Palliative care services to follow at SNF  Palliative care will sign off case at this point-please reconsult if patients status or goals change prior to discharge   Time In Time Out Total Time Spent with Patient  Total Overall Time  9:30a 9:50a 20 min 20 min   Freddie Breech, CNS-[facility] Palliative Medicine Team [facility] Health Team Phone: [phone] Pager: [phone]

## 2013-01-12 NOTE — Progress Notes (Signed)
Clinical Social Work Department CLINICAL SOCIAL WORK PLACEMENT NOTE 01/12/2013  Patient:  Garrett Reese, Garrett Reese  Account Number:  [id number] Admit date:  01/05/2013  Clinical Social Worker:  Robin Searing  Date/time:  01/10/2013 12:09 PM  Clinical Social Work is seeking post-discharge placement for this patient at the following level of care:   SKILLED NURSING   (*CSW will update this form in Epic as items are completed)   01/10/2013  Patient/family provided with [facility] Department of Clinical Social Work's list of facilities offering this level of care within the geographic area requested by the patient (or if unable, by the patient's family).  01/10/2013  Patient/family informed of their freedom to choose among providers that offer the needed level of care, that participate in Medicare, Medicaid or managed care program needed by the patient, have an available bed and are willing to accept the patient.  01/10/2013  Patient/family informed of [facility]' ownership interest in [facility], as well as of the fact that they are under no obligation to receive care at this facility.  PASARR submitted to EDS on 01/10/2013 PASARR number received from EDS on 01/10/2013  FL2 transmitted to all facilities in geographic area requested by pt/family on  01/10/2013 FL2 transmitted to all facilities within larger geographic area on   Patient informed that his/her managed care company has contracts with or will negotiate with  certain facilities, including the following:     Patient/family informed of bed offers received:  01/12/2013 Patient chooses bed at [facility] [place] Physician recommends and patient chooses bed at    Patient to be transferred to [facility] [place] on  01/12/2013 Patient to be transferred to facility by car  The following physician request were entered in Epic:  [name], MSW, [place] [phone]

## 2013-01-12 NOTE — Discharge Summary (Signed)
Physician Discharge Summary  Garrett Reese:[id number] DOB: [birth year] DOA: 01/05/2013  PCP: Benita Stabile, MD  Admit date: 01/05/2013 Discharge date: 01/12/2013  Time spent: 35 minutes  Recommendations for Outpatient Follow-up:  Followup with Benita Stabile, MD (PCP) in 1 week.  Followup with Dr. Imogene Burn in 2 weeks.  Discharge Diagnoses:  Principal Problem:  *Atrial fibrillation with rapid ventricular response Active Problems:  DYSLIPIDEMIA  HYPERTENSION  EMPHYSEMA, SEVERE  Chronic respiratory failure  Fall  Syncope  HCAP (healthcare-associated pneumonia)  Acute renal failure  Lower extremity ulceration  Encephalopathy  Chronic diastolic heart failure  PVD (peripheral vascular disease)  DOE (dyspnea on exertion)  Pain, lower leg  Pulmonary edema  Acute-on-chronic respiratory failure   Discharge Condition: Stable  Diet recommendation: Heart healthy diet  Filed Weights   01/09/13 0504 01/10/13 0612 01/12/13 0454  Weight: 89.268 kg (196 lb 12.8 oz) 90 kg (198 lb 6.6 oz) 87.181 kg (192 lb 3.2 oz)    History of present illness:  On admission: "79 year old man presented with history of fall upon getting out of his car 1/29. Initial workup in the emergency room was notable for atrial fibrillation with rapid ventricular response on arrival, acute renal failure, leukocytosis and pneumonia the patient was referred for admission. Syncope was considered by emergency department physician.   History obtained from patient and daughter at bedside. Patient was just discharged from [facility] 1/29, upon getting home he got out of the car and immediately fell down. He denies loss of consciousness or injury. He states he simply fell and he denied syncope. Upon arrival at patient's home, EMS noted that patient appeared clammy and pale and was cold to the touch. No discharge summary currently available, however patient has discharge instructions which list final diagnoses  including Sirs, right lower extremity cellulitis, pneumonia, acute on chronic diastolic heart failure, anasarca, acute on chronic renal failure, leukocytosis, atrial fibrillation with rapid ventricular response, elevated troponin, possible type II MI, ventricular tachycardia, SVT, hyperammonemia, questionable hepatic encephalopathy. By report he was at [facility] for 8 days and MRI of the brain was unremarkable, no records available.  Further history is notable for at least 11 falls since July of last year, with legs just "giving out". Family is also noted confusion and the patient saying strange things for at least several weeks now.   In the emergency department noted to be afebrile, borderline hypotension with one reading of 96/51, tachycardic on presentation. Normal pulse. Hypoxic. Chest x-ray showed pneumonia. CT head and neck unremarkable. Creatinine 1.63, BUN 36, alkaline phosphatase 230, AST 88. White blood cell count 16.9. Treated with IV diltiazem bolus and infusion, Levaquin. EKG atrial fibrillation with rapid ventricular response. Nonspecific ST changes."  Hospital Course:  Fall, possible syncope Syncope doubted by history, but he has potential for having hemodinamically unstable cardiac arrhythmias, with result in hypotension. Many falls in the preceeding months. PT evaluation recommended SNF. Was kept on telemetry without significant arrythmia noted. Echo with preserved EF. No episodes in hospital. No further w/u planned by cards.  Atrial fibrillation with RVR Patient was placed on IV diltiazem drip in the emergency department. He was switched to po metoprolol on 01/06/13 . No further recurrence of RVR during admisssion. Not a coumadin candidate due to frequent falls.   HCAP Appears stable without tachypnea or increased oxygen dependence, however has leukocytosis. Empiric antibiotics, started on admission and patient received 1 week of Vanc and Cefepime, discontinued on 01/12/2013 to  complete a course.  CKD stage  IV Baseline creatinine from [facility] records is at 1.6. Patient did receive IV fluids on the night of 01/06/13 when it was assumed he was having AKI . By the morning the patient was c/o dyspnea. Iv fluids were stopped. We did place patient on Lasix at 80 mg po bid on 01/06/13. By 01/09/13 patient was deemed euvolemic and we did reduce lasix to 40 mg daily.    Wounds bilateral lower extremities Due to PVD  COPD/chronic respiratory failure oxygen-dependent Stable. Continue oxygen. Continues Spiriva, Symbicort, albuterol as needed.  Subacute encephalopathy Etiology unclear. Discussed with daughter on 01/06/2013 and the daughter relates history of chronic cognitive deficits - no further w/u planned.   PVD - moderate to severe L > R Appreciate thoughtful VVS consult by Dr. Imogene Burn.  Patient not a candidate for surgery so he will be continued on medical therapy. Aortogram on 01/10/13 with severe bilat PVD  - FINDINGS:  1.On the left side, the common femoral and deep femoral artery are patent. There is moderate diffuse disease throughout the entire left superficial femoral artery with a tight stenosis of 95% in the distal superficial femoral artery and a second 95% stenosis just above the patella in the popliteal artery. The anterior tibial artery on the left is occluded. There is 2 vessel runoff via the posterior tibial and peroneal arteries with some moderate stenosis in the proximal peroneal artery on the left.  2. On the right side, the common femoral artery and external iliac arteries are patent. There is a stenosis in the proximal deep femoral artery. There is mild diffuse disease throughout the superficial femoral artery with a moderate stenosis at the adductor canal. The anterior tibial artery on the right is occluded. His a focal stenosis of the distal tibial peroneal trunk. There is 2 vessel runoff on the right via the peroneal and posterior tibial arteries.   CHF acute  on chronic diastolic Last echo 2013 at [place] office - repeat Echo in house this admission showed intact EF.  Patient compensated.  Consultants:  [facility] cardiology   Pulm   VVS   Procedures:  Echo  Study Conclusions - Left ventricle: The cavity size was mildly dilated. There was mild focal basal hypertrophy of the septum. Systolic function was normal. The estimated ejection fraction was in the range of 50% to 55%. Cannot exclude mild hypokinesis of the basalinferior myocardium. - Aortic valve: Moderate thickening and calcification, consistent with sclerosis. - Mitral valve: Mild regurgitation. - Left atrium: The atrium was mildly dilated. - Pulmonary arteries: PA peak pressure: 39mm Hg (S).  Impressions: - The right ventricular systolic pressure was increased consistent with mild pulmonary hypertension.  Vascular arterial dopplers ABI completed: Right ABI not calculated due to patient unable to withstand cuff inflation. Left ABI indicates moderate decrease in flow but may be falsely elevated.   RIGHT    LEFT     PRESSURE  WAVEFORM   PRESSURE  WAVEFORM   BRACHIAL  149  Triphasic  BRACHIAL  146  Triphasic   DP   Monophasic  DP   absent   AT    AT     PT   Biphasic  PT  100  Monophasic   PER    PER     GREAT TOE   wnl  GREAT TOE   flat     RIGHT  LEFT   ABI  Pressures not taken due to pain with inflation of cuff  0.67  Moderate decrease  Duplex imaging: Right: Diffuse irregular mild to moderate plaque throughout with no areas of significant stenosis. Waveforms triphasic or biphasic throughout. Left: Diffuse irregular moderate plaque throughout with significant stenosis in the mid and mid-distal femoral artery. Waveforms biphasic proximally and monophasic in the mid, distal femoral artery and popliteal artery.  Probable tibial vessel disease, left greater than right.  [name], [name], [name]  01/06/2013, 2:12 PM   Antibiotics: Cefepime 01/06/2013 >> 01/08/2013 levofloxacin 01/05/2013  >> 01/07/2013 Vancomycin  01/07/2013 >> 01/12/2013  Discharge Exam: Filed Vitals:   01/11/13 1923 01/11/13 2141 01/12/13 0454 01/12/13 1043  BP:  139/73 134/66 136/61  Pulse: 76 86 82 89  Temp:  98.2 F (36.8 C) 97.8 F (36.6 C)   TempSrc:  Oral Oral   Resp: 18  20   Height:      Weight:   87.181 kg (192 lb 3.2 oz)   SpO2: 94% 94% 96%    Discharge Instructions  Discharge Orders    Future Appointments: Provider: Department: Dept Phone: Center:   10-Feb-2013 9:15 AM Fransisco Hertz, MD [facility] [phone] VVS   03/23/2013 10:15 AM Barbaraann Share, MD [facility] [phone] None     Future Orders Please Complete By Expires   Diet - low sodium heart healthy      Increase activity slowly      Discharge instructions      Comments:   Followup with Benita Stabile, MD (PCP) in 1 week.  Followup with Dr. Imogene Burn in 2 weeks.       Medication List     As of 01/12/2013  1:06 PM    STOP taking these medications         amoxicillin-clavulanate 500-125 MG per tablet   Commonly known as: AUGMENTIN      glipiZIDE 2.5 MG 24 hr tablet   Commonly known as: GLUCOTROL XL      guaiFENesin 600 MG 12 hr tablet   Commonly known as: MUCINEX      metoprolol succinate 50 MG 24 hr tablet   Commonly known as: TOPROL-XL      metroNIDAZOLE 0.75 % cream   Commonly known as: METROCREAM      potassium chloride 10 MEQ tablet   Commonly known as: K-DUR      TAKE these medications         albuterol 108 (90 BASE) MCG/ACT inhaler   Commonly known as: PROVENTIL HFA;VENTOLIN HFA   Inhale 2 puffs into the lungs every 6 (six) hours as needed.      aspirin 81 MG chewable tablet   Chew 81 mg by mouth daily.      atorvastatin 20 MG tablet   Commonly known as: LIPITOR   Take 20 mg by mouth daily.      budesonide-formoterol 160-4.5 MCG/ACT inhaler   Commonly known as: SYMBICORT   Inhale 2 puffs into the lungs 2 (two) times daily.      fexofenadine 180 MG  tablet   Commonly known as: ALLEGRA   Take 180 mg by mouth daily as needed.      furosemide 40 MG tablet   Commonly known as: LASIX   Take 1 tablet (40 mg total) by mouth daily.      metoprolol 50 MG tablet   Commonly known as: LOPRESSOR   Take 50 mg by mouth 2 (two) times daily.      OXYGEN-HELIUM IN   Inhale 3 L/min into the lungs daily.  phenazopyridine 200 MG tablet   Commonly known as: PYRIDIUM   Take 1 tablet (200 mg total) by mouth 3 (three) times daily as needed (dysuria).      tiotropium 18 MCG inhalation capsule   Commonly known as: SPIRIVA   Place 18 mcg into inhaler and inhale daily.      traMADol 50 MG tablet   Commonly known as: ULTRAM   Take 1 tablet (50 mg total) by mouth every 6 (six) hours as needed for pain.           Follow-up Information    Follow up with Nilda Simmer, MD. On 02/04/2013. (9 am)    Contact information:   [address] [place] [address] [phone]       Follow up with Benita Stabile, MD. Schedule an appointment as soon as possible for a visit in 1 week.   Contact information:   [facility] [address] [place] [address] [phone]           The results of significant diagnostics from this hospitalization (including imaging, microbiology, ancillary and laboratory) are listed below for reference.    Significant Diagnostic Studies: Dg Chest 2 View  01/05/2013  *RADIOLOGY REPORT*  Clinical Data: Fall, chest pain.  CHEST - 2 VIEW  Comparison: 12/25/2011  Findings: Cardiomegaly.  Bibasilar opacities have increased in the interval.  In addition, increased interstitial markings.  Small effusions not excluded.  No pneumothorax.  Mediastinal contours unchanged.  No acute osseous finding.  IMPRESSION: Increased bibasilar opacities and interstitial markings.  May represent pneumonia or edema.   Original Report Authenticated By: Jearld Lesch, M.D.    Dg Tibia/fibula  Left  01/05/2013  *RADIOLOGY REPORT*  Clinical Data: Fall, leg abrasions.  LEFT TIBIA AND FIBULA - 2 VIEW  Comparison: Contralateral tibia / fibula  Findings: Left tibia / fibula radiographs show no displaced fracture.  Partially imaged degenerative changes of the knee joint. These are not optimized evaluate the joint spaces.  Enthesopathic changes at the tibial tubercle.  Atherosclerotic vascular calcifications.  IMPRESSION: No acute osseous abnormality of the left tibia / fibula.   Original Report Authenticated By: Jearld Lesch, M.D.    Dg Tibia/fibula Right  01/05/2013  *RADIOLOGY REPORT*  Clinical Data: Fall, multiple abrasions/bruising to the right and left lower legs.  RIGHT TIBIA AND FIBULA - 2 VIEW  Comparison: Contralateral tibia / fibula  Findings: Right tibia / fibula radiographs show no displaced fracture.  Partially imaged degenerative changes of the knee joint. These views are not optimized to evaluate the joint spaces. Atherosclerotic vascular calcifications.  IMPRESSION:  No acute osseous abnormality of the right tibia / fibula.   Original Report Authenticated By: Jearld Lesch, M.D.    Ct Head Wo Contrast  01/05/2013  *RADIOLOGY REPORT*  Clinical Data:  Fall, on anticoagulation  CT HEAD WITHOUT CONTRAST CT CERVICAL SPINE WITHOUT CONTRAST  Technique:  Multidetector CT imaging of the head and cervical spine was performed following the standard protocol without intravenous contrast.  Multiplanar CT image reconstructions of the cervical spine were also generated.  Comparison:   None  CT HEAD  Findings: Prominence of the sulci, cisterns, and ventricles, in keeping with volume loss. There are subcortical and periventricular white matter hypodensities, a nonspecific finding most often seen with chronic microangiopathic changes.  There is no evidence for acute hemorrhage, overt hydrocephalus, mass lesion, or abnormal extra-axial fluid collection.  No definite CT evidence for acute cortical  based (large artery) infarction. The visualized paranasal sinuses and mastoid air cells are predominately clear. Scattered atherosclerosis.  IMPRESSION: Volume loss and white matter changes, not unexpected for age.  No CT evidence of acute intracranial abnormality.  CT CERVICAL SPINE  Findings: Centrolobular emphysematous changes of the lung apices. Scattered atherosclerotic vascular calcifications.  Maintained craniocervical relationship.  No dens fracture.  Maintained vertebral body height and alignment.  Multilevel degenerative changes with uncovertebral joint hypertrophy, facet arthropathy, and disc osteophyte complexes resulting in mild central canal narrowing.  Paravertebral soft tissues within normal limits.  IMPRESSION: Multilevel degenerative changes.  No acute osseous abnormality of the cervical spine.   Original Report Authenticated By: Jearld Lesch, M.D.    Ct Cervical Spine Wo Contrast  01/05/2013  *RADIOLOGY REPORT*  Clinical Data:  Fall, on anticoagulation  CT HEAD WITHOUT CONTRAST CT CERVICAL SPINE WITHOUT CONTRAST  Technique:  Multidetector CT imaging of the head and cervical spine was performed following the standard protocol without intravenous contrast.  Multiplanar CT image reconstructions of the cervical spine were also generated.  Comparison:   None  CT HEAD  Findings: Prominence of the sulci, cisterns, and ventricles, in keeping with volume loss. There are subcortical and periventricular white matter hypodensities, a nonspecific finding most often seen with chronic microangiopathic changes.  There is no evidence for acute hemorrhage, overt hydrocephalus, mass lesion, or abnormal extra-axial fluid collection.  No definite CT evidence for acute cortical based (large artery) infarction. The visualized paranasal sinuses and mastoid air cells are predominately clear. Scattered atherosclerosis.  IMPRESSION: Volume loss and white matter changes, not unexpected for age.  No CT evidence of  acute intracranial abnormality.  CT CERVICAL SPINE  Findings: Centrolobular emphysematous changes of the lung apices. Scattered atherosclerotic vascular calcifications.  Maintained craniocervical relationship.  No dens fracture.  Maintained vertebral body height and alignment.  Multilevel degenerative changes with uncovertebral joint hypertrophy, facet arthropathy, and disc osteophyte complexes resulting in mild central canal narrowing.  Paravertebral soft tissues within normal limits.  IMPRESSION: Multilevel degenerative changes.  No acute osseous abnormality of the cervical spine.   Original Report Authenticated By: Jearld Lesch, M.D.     Microbiology: No results found for this or any previous visit (from the past 240 hour(s)).   Labs: Basic Metabolic Panel:  Lab 01/12/13 1610 01/11/13 0545 01/10/13 0635 01/09/13 0555 01/08/13 0500  NA 141 141 138 141 138  K 3.8 3.5 3.5 3.3* 4.1  CL 96 97 93* 95* 94*  CO2 38* 39* 37* 41* 38*  GLUCOSE 125* 127* 120* 113* 120*  BUN 26* 27* 31* 33* 40*  CREATININE 1.16 1.22 1.341.36* 1.42* 1.56*  CALCIUM 9.1 8.9 8.9 9.1 8.9  MG -- -- -- -- --  PHOS -- -- -- -- --   Liver Function Tests:  Lab 01/05/13 2121  AST 88*  ALT 42  ALKPHOS 230*  BILITOT 1.0  PROT 7.8  ALBUMIN 2.8*   No results found for this basename: LIPASE:5,AMYLASE:5 in the last 168 hours No results found for this basename: AMMONIA:5 in the last 168 hours CBC:  Lab 01/11/13 0545 01/10/13 0635 01/09/13 0555 01/08/13 0500 01/07/13 0542 01/05/13 2121  WBC 10.1 10.7* 9.3 10.6* 12.5* --  NEUTROABS -- -- -- -- -- 15.6*  HGB 10.9* 10.6* 10.7* 10.5* 11.2* --  HCT 33.8* 33.6* 34.0* 32.6* 35.2* --  MCV 93.6 94.4 94.4 94.5 94.6 --  PLT 182 190 196 175 216 --   Cardiac Enzymes:  Lab 01/05/13 2311  CKTOTAL --  CKMB --  CKMBINDEX --  TROPONINI <0.30   BNP: BNP (last 3 results)  Basename 01/05/13 2311  PROBNP 12233.0*   CBG:  Lab 01/12/13 0739 01/11/13 2145 01/11/13 1704  01/11/13 1135 01/11/13 0735  GLUCAP 111* 164* 196* 177* 131*       Signed:  Shenoa Hattabaugh A  [facility] 01/12/2013, 1:06 PM

## 2013-01-12 NOTE — Progress Notes (Signed)
[facility] PROGRESS NOTE  Garrett Reese BJY:[id number] DOB: 1921-11-28 DOA: 01/05/2013 PCP: Benita Stabile, MD  Assessment/Plan: Fall, possible syncope Syncope doubted by history, but he has potential for having hemodinamically unstable cardiac arrhythmias, with result in hypotension. Many falls in the preceeding months. PT evaluation recommended SNF. Was kept on telemetry without significant arrythmia noted. Echo with preserved EF. No episodes in hospital. No further w/u planned by cards.  Atrial fibrillation with RVR Patient was placed on IV diltiazem drip in the emergency department. He was switched to po metoprolol on 01/06/13 . No further recurrence of RVR during admisssion. Not a coumadin candidate due to frequent falls.   HCAP Appears stable without tachypnea or increased oxygen dependence, however has leukocytosis. Empiric antibiotics, started on admission and patient received 1 week of Vanc and Cefepime, discontinued on 01/12/2013 to complete a course.  CKD stage IV Baseline creatinine from [facility] cards records is at 1.6. Patient did receive IV fluids on the night of 01/06/13 when it was assumed he was having AKI . By the morning the patient was c/o dyspnea. Iv fluids were stopped. We did place patient on Lasix at 80 mg po bid on 01/06/13. By 01/09/13 patient was deemed euvolemic and we did reduce lasix to 40 mg daily.    Wounds bilateral lower extremities Due to PVD  COPD/chronic respiratory failure oxygen-dependent Stable. Continue oxygen. Continues Spiriva, Symbicort, albuterol as needed.  Subacute encephalopathy Etiology unclear. Discussed with daughter on 01/06/2013 and the daughter relates history of chronic cognitive deficits - no further w/u planned.   PVD - moderate to severe L > R Appreciate thoughtful VVS consult by Dr. Imogene Burn.  Patient not a candidate for surgery so he will be continued on medical therapy. Aortogram on 01/10/13 with severe bilat PVD  - FINDINGS:   1.On the left side, the common femoral and deep femoral artery are patent. There is moderate diffuse disease throughout the entire left superficial femoral artery with a tight stenosis of 95% in the distal superficial femoral artery and a second 95% stenosis just above the patella in the popliteal artery. The anterior tibial artery on the left is occluded. There is 2 vessel runoff via the posterior tibial and peroneal arteries with some moderate stenosis in the proximal peroneal artery on the left.  2. On the right side, the common femoral artery and external iliac arteries are patent. There is a stenosis in the proximal deep femoral artery. There is mild diffuse disease throughout the superficial femoral artery with a moderate stenosis at the adductor canal. The anterior tibial artery on the right is occluded. His a focal stenosis of the distal tibial peroneal trunk. There is 2 vessel runoff on the right via the peroneal and posterior tibial arteries.   CHF acute on chronic diastolic Last echo 2013 at [place] office - repeat Echo in house this admission showed intact EF.  Patient compensated.  Code Status: full Family Communication: daughter  Disposition Plan: SNF     Consultants:  [facility]   Pulm   VVS   Procedures:  Echo  Study Conclusions - Left ventricle: The cavity size was mildly dilated. There was mild focal basal hypertrophy of the septum. Systolic function was normal. The estimated ejection fraction was in the range of 50% to 55%. Cannot exclude mild hypokinesis of the basalinferior myocardium. - Aortic valve: Moderate thickening and calcification, consistent with sclerosis. - Mitral valve: Mild regurgitation. - Left atrium: The atrium was mildly dilated. - Pulmonary arteries: PA  peak pressure: 39mm Hg (S).  Impressions: - The right ventricular systolic pressure was increased consistent with mild pulmonary hypertension.  Vascular arterial dopplers ABI completed:  Right ABI not calculated due to patient unable to withstand cuff inflation. Left ABI indicates moderate decrease in flow but may be falsely elevated.   RIGHT    LEFT     PRESSURE  WAVEFORM   PRESSURE  WAVEFORM   BRACHIAL  149  Triphasic  BRACHIAL  146  Triphasic   DP   Monophasic  DP   absent   AT    AT     PT   Biphasic  PT  100  Monophasic   PER    PER     GREAT TOE   wnl  GREAT TOE   flat     RIGHT  LEFT   ABI  Pressures not taken due to pain with inflation of cuff  0.67  Moderate decrease   Duplex imaging: Right: Diffuse irregular mild to moderate plaque throughout with no areas of significant stenosis. Waveforms triphasic or biphasic throughout. Left: Diffuse irregular moderate plaque throughout with significant stenosis in the mid and mid-distal femoral artery. Waveforms biphasic proximally and monophasic in the mid, distal femoral artery and popliteal artery.  Probable tibial vessel disease, left greater than right.  [name], [name], [name]  01/06/2013, 2:12 PM   Antibiotics: Cefepime 01/06/2013 >> 01/08/2013 levofloxacin 01/05/2013 >> 01/07/2013 Vancomycin  01/07/2013 >> 01/12/2013  HPI/Subjective: No new events  Objective: Filed Vitals:   01/11/13 1923 01/11/13 2141 01/12/13 0454 01/12/13 1043  BP:  139/73 134/66 136/61  Pulse: 76 86 82 89  Temp:  98.2 F (36.8 C) 97.8 F (36.6 C)   TempSrc:  Oral Oral   Resp: 18  20   Height:      Weight:   87.181 kg (192 lb 3.2 oz)   SpO2: 94% 94% 96%    Patient Vitals for the past 24 hrs:  BP Temp Temp src Pulse Resp SpO2 Weight  01/12/13 1043 136/61 mmHg - - 89  - - -  01/12/13 0454 134/66 mmHg 97.8 F (36.6 C) Oral 82  20  96 % 87.181 kg (192 lb 3.2 oz)  01/11/13 2141 139/73 mmHg 98.2 F (36.8 C) Oral 86  - 94 % -  01/11/13 1923 - - - 76  18  94 % -  01/11/13 1457 - - - - - 95 % -  01/11/13 1400 129/71 mmHg 97.6 F (36.4 C) Oral 72  18  93 % -     Intake/Output Summary (Last 24 hours) at 01/12/13 1244 Last data filed at  01/12/13 1045  Gross per 24 hour  Intake   1018 ml  Output    500 ml  Net    518 ml   Filed Weights   01/09/13 0504 01/10/13 0612 01/12/13 0454  Weight: 89.268 kg (196 lb 12.8 oz) 90 kg (198 lb 6.6 oz) 87.181 kg (192 lb 3.2 oz)    Exam:   General:  Alert, and oriented   Cardiovascular: irreg irreg   Respiratory: CTAB   Abdomen: obese , soft  LE - with peripheral cyanosis, wounds covered by gauze, pulses not palpable   Data Reviewed: Basic Metabolic Panel:  Lab 01/12/13 8295 01/11/13 0545 01/10/13 0635 01/09/13 0555 01/08/13 0500  NA 141 141 138 141 138  K 3.8 3.5 3.5 3.3* 4.1  CL 96 97 93* 95* 94*  CO2 38* 39* 37* 41* 38*  GLUCOSE  125* 127* 120* 113* 120*  BUN 26* 27* 31* 33* 40*  CREATININE 1.16 1.22 1.341.36* 1.42* 1.56*  CALCIUM 9.1 8.9 8.9 9.1 8.9  MG -- -- -- -- --  PHOS -- -- -- -- --   Liver Function Tests:  Lab 01/05/13 2121  AST 88*  ALT 42  ALKPHOS 230*  BILITOT 1.0  PROT 7.8  ALBUMIN 2.8*   No results found for this basename: LIPASE:5,AMYLASE:5 in the last 168 hours No results found for this basename: AMMONIA:5 in the last 168 hours CBC:  Lab 01/11/13 0545 01/10/13 0635 01/09/13 0555 01/08/13 0500 01/07/13 0542 01/05/13 2121  WBC 10.1 10.7* 9.3 10.6* 12.5* --  NEUTROABS -- -- -- -- -- 15.6*  HGB 10.9* 10.6* 10.7* 10.5* 11.2* --  HCT 33.8* 33.6* 34.0* 32.6* 35.2* --  MCV 93.6 94.4 94.4 94.5 94.6 --  PLT 182 190 196 175 216 --   Cardiac Enzymes:  Lab 01/05/13 2311  CKTOTAL --  CKMB --  CKMBINDEX --  TROPONINI <0.30   BNP (last 3 results)  Basename 01/05/13 2311  PROBNP 12233.0*   CBG:  Lab 01/12/13 0739 01/11/13 2145 01/11/13 1704 01/11/13 1135 01/11/13 0735  GLUCAP 111* 164* 196* 177* 131*    No results found for this or any previous visit (from the past 240 hour(s)).   Studies: No results found.  Scheduled Meds:    . aspirin  81 mg Oral Daily  . atorvastatin  20 mg Oral Daily  . budesonide-formoterol  2 puff  Inhalation BID  . enoxaparin  40 mg Subcutaneous Q24H  . furosemide  40 mg Oral Daily  . metoprolol  50 mg Oral BID  . sodium chloride  3 mL Intravenous Q12H  . tiotropium  18 mcg Inhalation Daily  . vancomycin  750 mg Intravenous Q24H   Continuous Infusions:    Principal Problem:  *Atrial fibrillation with rapid ventricular response Active Problems:  DYSLIPIDEMIA  HYPERTENSION  EMPHYSEMA, SEVERE  Chronic respiratory failure  Fall  Syncope  HCAP (healthcare-associated pneumonia)  Acute renal failure  Lower extremity ulceration  Encephalopathy  Chronic diastolic heart failure  PVD (peripheral vascular disease)  DOE (dyspnea on exertion)  Pain, lower leg  Pulmonary edema  Acute-on-chronic respiratory failure      Dallen Bunte A  [facility] Pager [phone]. If 7PM-7AM, please contact night-coverage at [email or web address], password [email or web address] 01/12/2013, 12:44 PM  LOS: 7 days

## 2013-01-12 NOTE — Progress Notes (Signed)
Reviewed discharge instructions with patient and daughter, they stated their understanding.  Report called to nurse I[place] at [facility].  Patient transported to SNF by daughter.  Colman Cater

## 2013-01-13 LAB — GLUCOSE, CAPILLARY: Glucose-Capillary: 185 mg/dL — ABNORMAL HIGH (ref 70–99)

## 2013-01-20 NOTE — Consult Note (Signed)
I have reviewed and discussed the care of this patient in detail with the nurse practitioner including pertinent patient records, physical exam findings and data. I agree with details of this encounter.  

## 2013-01-24 ENCOUNTER — Emergency Department (HOSPITAL_COMMUNITY): Payer: Medicare Other

## 2013-01-24 ENCOUNTER — Encounter (HOSPITAL_COMMUNITY): Payer: Self-pay | Admitting: *Deleted

## 2013-01-24 ENCOUNTER — Inpatient Hospital Stay (HOSPITAL_COMMUNITY)
Admission: EM | Admit: 2013-01-24 | Discharge: 2013-02-05 | DRG: 208 | Disposition: E | Payer: Medicare Other | Attending: Internal Medicine | Admitting: Internal Medicine

## 2013-01-24 ENCOUNTER — Telehealth: Payer: Self-pay | Admitting: Internal Medicine

## 2013-01-24 DIAGNOSIS — J449 Chronic obstructive pulmonary disease, unspecified: Secondary | ICD-10-CM | POA: Diagnosis present

## 2013-01-24 DIAGNOSIS — I4729 Other ventricular tachycardia: Secondary | ICD-10-CM | POA: Diagnosis not present

## 2013-01-24 DIAGNOSIS — J690 Pneumonitis due to inhalation of food and vomit: Secondary | ICD-10-CM | POA: Diagnosis not present

## 2013-01-24 DIAGNOSIS — Z9119 Patient's noncompliance with other medical treatment and regimen: Secondary | ICD-10-CM

## 2013-01-24 DIAGNOSIS — Z993 Dependence on wheelchair: Secondary | ICD-10-CM

## 2013-01-24 DIAGNOSIS — I4891 Unspecified atrial fibrillation: Secondary | ICD-10-CM | POA: Diagnosis present

## 2013-01-24 DIAGNOSIS — I469 Cardiac arrest, cause unspecified: Secondary | ICD-10-CM | POA: Diagnosis not present

## 2013-01-24 DIAGNOSIS — Z91199 Patient's noncompliance with other medical treatment and regimen due to unspecified reason: Secondary | ICD-10-CM

## 2013-01-24 DIAGNOSIS — IMO0002 Reserved for concepts with insufficient information to code with codable children: Secondary | ICD-10-CM

## 2013-01-24 DIAGNOSIS — I472 Ventricular tachycardia, unspecified: Secondary | ICD-10-CM | POA: Diagnosis not present

## 2013-01-24 DIAGNOSIS — D696 Thrombocytopenia, unspecified: Secondary | ICD-10-CM | POA: Diagnosis present

## 2013-01-24 DIAGNOSIS — A419 Sepsis, unspecified organism: Secondary | ICD-10-CM | POA: Diagnosis not present

## 2013-01-24 DIAGNOSIS — J189 Pneumonia, unspecified organism: Principal | ICD-10-CM | POA: Diagnosis present

## 2013-01-24 DIAGNOSIS — E119 Type 2 diabetes mellitus without complications: Secondary | ICD-10-CM | POA: Diagnosis present

## 2013-01-24 DIAGNOSIS — E785 Hyperlipidemia, unspecified: Secondary | ICD-10-CM | POA: Diagnosis present

## 2013-01-24 DIAGNOSIS — R6520 Severe sepsis without septic shock: Secondary | ICD-10-CM | POA: Diagnosis not present

## 2013-01-24 DIAGNOSIS — J441 Chronic obstructive pulmonary disease with (acute) exacerbation: Secondary | ICD-10-CM | POA: Diagnosis present

## 2013-01-24 DIAGNOSIS — Z9981 Dependence on supplemental oxygen: Secondary | ICD-10-CM

## 2013-01-24 DIAGNOSIS — Z85118 Personal history of other malignant neoplasm of bronchus and lung: Secondary | ICD-10-CM

## 2013-01-24 DIAGNOSIS — Z79899 Other long term (current) drug therapy: Secondary | ICD-10-CM

## 2013-01-24 DIAGNOSIS — N183 Chronic kidney disease, stage 3 unspecified: Secondary | ICD-10-CM | POA: Diagnosis present

## 2013-01-24 DIAGNOSIS — N179 Acute kidney failure, unspecified: Secondary | ICD-10-CM | POA: Diagnosis present

## 2013-01-24 DIAGNOSIS — R319 Hematuria, unspecified: Secondary | ICD-10-CM | POA: Diagnosis not present

## 2013-01-24 DIAGNOSIS — B49 Unspecified mycosis: Secondary | ICD-10-CM | POA: Diagnosis not present

## 2013-01-24 DIAGNOSIS — I739 Peripheral vascular disease, unspecified: Secondary | ICD-10-CM | POA: Diagnosis present

## 2013-01-24 DIAGNOSIS — R55 Syncope and collapse: Secondary | ICD-10-CM

## 2013-01-24 DIAGNOSIS — I509 Heart failure, unspecified: Secondary | ICD-10-CM | POA: Diagnosis present

## 2013-01-24 DIAGNOSIS — I279 Pulmonary heart disease, unspecified: Secondary | ICD-10-CM | POA: Diagnosis present

## 2013-01-24 DIAGNOSIS — I5033 Acute on chronic diastolic (congestive) heart failure: Secondary | ICD-10-CM | POA: Diagnosis present

## 2013-01-24 DIAGNOSIS — I129 Hypertensive chronic kidney disease with stage 1 through stage 4 chronic kidney disease, or unspecified chronic kidney disease: Secondary | ICD-10-CM | POA: Diagnosis present

## 2013-01-24 DIAGNOSIS — D649 Anemia, unspecified: Secondary | ICD-10-CM | POA: Diagnosis present

## 2013-01-24 DIAGNOSIS — Z515 Encounter for palliative care: Secondary | ICD-10-CM

## 2013-01-24 DIAGNOSIS — Z87891 Personal history of nicotine dependence: Secondary | ICD-10-CM

## 2013-01-24 DIAGNOSIS — G9340 Encephalopathy, unspecified: Secondary | ICD-10-CM | POA: Diagnosis present

## 2013-01-24 DIAGNOSIS — I214 Non-ST elevation (NSTEMI) myocardial infarction: Secondary | ICD-10-CM | POA: Diagnosis not present

## 2013-01-24 DIAGNOSIS — G931 Anoxic brain damage, not elsewhere classified: Secondary | ICD-10-CM | POA: Diagnosis not present

## 2013-01-24 DIAGNOSIS — J9620 Acute and chronic respiratory failure, unspecified whether with hypoxia or hypercapnia: Secondary | ICD-10-CM | POA: Diagnosis present

## 2013-01-24 DIAGNOSIS — R1310 Dysphagia, unspecified: Secondary | ICD-10-CM | POA: Diagnosis present

## 2013-01-24 LAB — URINALYSIS, ROUTINE W REFLEX MICROSCOPIC
Bilirubin Urine: NEGATIVE
Ketones, ur: NEGATIVE mg/dL
Leukocytes, UA: NEGATIVE
Nitrite: NEGATIVE
Urobilinogen, UA: 0.2 mg/dL (ref 0.0–1.0)

## 2013-01-24 LAB — COMPREHENSIVE METABOLIC PANEL
AST: 50 U/L — ABNORMAL HIGH (ref 0–37)
CO2: 32 mEq/L (ref 19–32)
Calcium: 9.3 mg/dL (ref 8.4–10.5)
Creatinine, Ser: 1.57 mg/dL — ABNORMAL HIGH (ref 0.50–1.35)
GFR calc Af Amer: 43 mL/min — ABNORMAL LOW (ref >90)
GFR calc non Af Amer: 37 mL/min — ABNORMAL LOW (ref >90)
Sodium: 140 mEq/L (ref 135–145)
Total Protein: 7 g/dL (ref 6.0–8.3)

## 2013-01-24 LAB — CBC WITH DIFFERENTIAL/PLATELET
Basophils Absolute: 0.1 10*3/uL (ref 0.0–0.1)
Eosinophils Absolute: 0 10*3/uL (ref 0.0–0.7)
Eosinophils Relative: 0 % (ref 0–5)
HCT: 40.4 % (ref 39.0–52.0)
Lymphocytes Relative: 6 % — ABNORMAL LOW (ref 12–46)
MCH: 29.8 pg (ref 26.0–34.0)
MCHC: 30.9 g/dL (ref 30.0–36.0)
MCV: 96.4 fL (ref 78.0–100.0)
Monocytes Absolute: 1.2 10*3/uL — ABNORMAL HIGH (ref 0.1–1.0)
Platelets: 116 10*3/uL — ABNORMAL LOW (ref 150–400)
RDW: 15.2 % (ref 11.5–15.5)
WBC: 18 10*3/uL — ABNORMAL HIGH (ref 4.0–10.5)

## 2013-01-24 LAB — URINE MICROSCOPIC-ADD ON

## 2013-01-24 LAB — PRO B NATRIURETIC PEPTIDE: Pro B Natriuretic peptide (BNP): 8959 pg/mL — ABNORMAL HIGH (ref 0–450)

## 2013-01-24 LAB — TROPONIN I
Troponin I: <0.3 ng/mL (ref <0.30)
Troponin I: <0.3 ng/mL (ref <0.30)

## 2013-01-24 MED ORDER — SODIUM CHLORIDE 0.9 % IJ SOLN
3.0000 mL | Freq: Two times a day (BID) | INTRAMUSCULAR | Status: DC
  Administered 2013-01-24: 3 mL via INTRAVENOUS

## 2013-01-24 MED ORDER — SODIUM CHLORIDE 0.9 % IV SOLN: 250.0000 mL | INTRAVENOUS | Status: DC | PRN

## 2013-01-24 MED ORDER — TIOTROPIUM BROMIDE MONOHYDRATE 18 MCG IN CAPS
18.0000 ug | ORAL_CAPSULE | RESPIRATORY_TRACT | Status: DC
  Filled 2013-01-24: qty 5

## 2013-01-24 MED ORDER — VANCOMYCIN HCL 10 G IV SOLR
1250.0000 mg | INTRAVENOUS | Status: DC
  Filled 2013-01-24: qty 1250

## 2013-01-24 MED ORDER — ALUM & MAG HYDROXIDE-SIMETH 200-200-20 MG/5ML PO SUSP
30.0000 mL | Freq: Four times a day (QID) | ORAL | Status: DC | PRN
  Filled 2013-01-24: qty 30

## 2013-01-24 MED ORDER — ENOXAPARIN SODIUM 40 MG/0.4ML ~~LOC~~ SOLN
40.0000 mg | SUBCUTANEOUS | Status: DC
  Administered 2013-01-24: 40 mg via SUBCUTANEOUS
  Filled 2013-01-24 (×2): qty 0.4

## 2013-01-24 MED ORDER — MOMETASONE FURO-FORMOTEROL FUM 100-5 MCG/ACT IN AERO
2.0000 | INHALATION_SPRAY | Freq: Two times a day (BID) | RESPIRATORY_TRACT | Status: DC
  Administered 2013-01-24: 2 via RESPIRATORY_TRACT
  Filled 2013-01-24: qty 8.8

## 2013-01-24 MED ORDER — INSULIN ASPART 100 UNIT/ML ~~LOC~~ SOLN: 0.0000 [IU] | Freq: Three times a day (TID) | SUBCUTANEOUS | Status: DC

## 2013-01-24 MED ORDER — FUROSEMIDE 10 MG/ML IJ SOLN: 40.0000 mg | Freq: Every day | INTRAMUSCULAR | Status: DC

## 2013-01-24 MED ORDER — ATORVASTATIN CALCIUM 20 MG PO TABS
20.0000 mg | ORAL_TABLET | Freq: Every day | ORAL | Status: DC
  Administered 2013-01-24 – 2013-01-28 (×5): 20 mg via ORAL
  Filled 2013-01-24 (×5): qty 1

## 2013-01-24 MED ORDER — ONDANSETRON HCL 4 MG/2ML IJ SOLN: 4.0000 mg | Freq: Four times a day (QID) | INTRAMUSCULAR | Status: DC | PRN

## 2013-01-24 MED ORDER — GUAIFENESIN ER 600 MG PO TB12
600.0000 mg | ORAL_TABLET | Freq: Two times a day (BID) | ORAL | Status: DC
  Administered 2013-01-24: 600 mg via ORAL
  Filled 2013-01-24 (×3): qty 1

## 2013-01-24 MED ORDER — LORATADINE 10 MG PO TABS: 10.0000 mg | ORAL_TABLET | Freq: Every day | ORAL | Status: DC

## 2013-01-24 MED ORDER — SENNOSIDES-DOCUSATE SODIUM 8.6-50 MG PO TABS
1.0000 | ORAL_TABLET | Freq: Every evening | ORAL | Status: DC | PRN
  Filled 2013-01-24: qty 1

## 2013-01-24 MED ORDER — ALBUTEROL SULFATE (5 MG/ML) 0.5% IN NEBU
5.0000 mg | INHALATION_SOLUTION | Freq: Once | RESPIRATORY_TRACT | Status: AC
  Administered 2013-01-24: 5 mg via RESPIRATORY_TRACT
  Filled 2013-01-24: qty 1

## 2013-01-24 MED ORDER — PIPERACILLIN-TAZOBACTAM 3.375 G IVPB
3.3750 g | Freq: Three times a day (TID) | INTRAVENOUS | Status: DC
  Administered 2013-01-24 – 2013-01-25 (×2): 3.375 g via INTRAVENOUS
  Filled 2013-01-24 (×6): qty 50

## 2013-01-24 MED ORDER — ACETAMINOPHEN 650 MG RE SUPP: 650.0000 mg | Freq: Four times a day (QID) | RECTAL | Status: DC | PRN

## 2013-01-24 MED ORDER — ACETAMINOPHEN 325 MG PO TABS: 650.0000 mg | ORAL_TABLET | Freq: Four times a day (QID) | ORAL | Status: DC | PRN

## 2013-01-24 MED ORDER — ONDANSETRON HCL 4 MG PO TABS: 4.0000 mg | ORAL_TABLET | Freq: Four times a day (QID) | ORAL | Status: DC | PRN

## 2013-01-24 MED ORDER — ZOLPIDEM TARTRATE 5 MG PO TABS: 5.0000 mg | ORAL_TABLET | Freq: Every evening | ORAL | Status: DC | PRN

## 2013-01-24 MED ORDER — BIOTENE DRY MOUTH MT LIQD
15.0000 mL | Freq: Two times a day (BID) | OROMUCOSAL | Status: DC
  Administered 2013-01-24: 15 mL via OROMUCOSAL

## 2013-01-24 MED ORDER — BUDESONIDE-FORMOTEROL FUMARATE 160-4.5 MCG/ACT IN AERO
2.0000 | INHALATION_SPRAY | Freq: Two times a day (BID) | RESPIRATORY_TRACT | Status: DC
  Administered 2013-01-24: 2 via RESPIRATORY_TRACT
  Filled 2013-01-24: qty 6

## 2013-01-24 MED ORDER — TRAMADOL HCL 50 MG PO TABS
50.0000 mg | ORAL_TABLET | Freq: Four times a day (QID) | ORAL | Status: DC | PRN
  Filled 2013-01-24: qty 1

## 2013-01-24 MED ORDER — PIPERACILLIN-TAZOBACTAM 3.375 G IVPB
3.3750 g | Freq: Once | INTRAVENOUS | Status: DC
  Filled 2013-01-24: qty 50

## 2013-01-24 MED ORDER — SODIUM CHLORIDE 0.9 % IJ SOLN
3.0000 mL | INTRAMUSCULAR | Status: DC | PRN
  Administered 2013-01-25 (×2): 3 mL via INTRAVENOUS

## 2013-01-24 MED ORDER — ALBUTEROL SULFATE (5 MG/ML) 0.5% IN NEBU
2.5000 mg | INHALATION_SOLUTION | Freq: Four times a day (QID) | RESPIRATORY_TRACT | Status: DC
  Administered 2013-01-24: 2.5 mg via RESPIRATORY_TRACT
  Filled 2013-01-24: qty 0.5

## 2013-01-24 MED ORDER — PRO-STAT SUGAR FREE PO LIQD
30.0000 mL | Freq: Two times a day (BID) | ORAL | Status: DC
  Administered 2013-01-24: 30 mL via ORAL
  Filled 2013-01-24: qty 30

## 2013-01-24 MED ORDER — VANCOMYCIN HCL IN DEXTROSE 1-5 GM/200ML-% IV SOLN
1000.0000 mg | Freq: Once | INTRAVENOUS | Status: AC
  Administered 2013-01-24: 1000 mg via INTRAVENOUS
  Filled 2013-01-24: qty 200

## 2013-01-24 MED ORDER — ASPIRIN 81 MG PO CHEW
81.0000 mg | CHEWABLE_TABLET | Freq: Every day | ORAL | Status: DC
  Administered 2013-01-24 – 2013-01-28 (×5): 81 mg via ORAL
  Filled 2013-01-24 (×4): qty 1

## 2013-01-24 MED ORDER — IPRATROPIUM BROMIDE 0.02 % IN SOLN
0.5000 mg | Freq: Once | RESPIRATORY_TRACT | Status: AC
  Administered 2013-01-24: 0.5 mg via RESPIRATORY_TRACT
  Filled 2013-01-24: qty 2.5

## 2013-01-24 MED ORDER — IPRATROPIUM BROMIDE 0.02 % IN SOLN: 250.0000 ug | Freq: Four times a day (QID) | RESPIRATORY_TRACT | Status: DC

## 2013-01-24 MED ORDER — PIPERACILLIN-TAZOBACTAM 3.375 G IVPB
3.3750 g | Freq: Once | INTRAVENOUS | Status: AC
  Administered 2013-01-24: 3.375 g via INTRAVENOUS

## 2013-01-24 MED ORDER — METOPROLOL TARTRATE 50 MG PO TABS
50.0000 mg | ORAL_TABLET | Freq: Two times a day (BID) | ORAL | Status: DC
  Administered 2013-01-24: 50 mg via ORAL
  Filled 2013-01-24 (×3): qty 1

## 2013-01-24 MED ORDER — FUROSEMIDE 10 MG/ML IJ SOLN
40.0000 mg | Freq: Four times a day (QID) | INTRAMUSCULAR | Status: DC
  Administered 2013-01-25: 40 mg via INTRAVENOUS
  Filled 2013-01-24 (×2): qty 4

## 2013-01-24 NOTE — ED Notes (Signed)
x7 unsuccessful IV attempts made- 3 by Tarri Glenn RN, x2 by Diamantina Monks, x1 by Youlanda Mighty RN, and x1 by Hardie Pulley RN. Dr Estell Harpin aware, patient to have EJ placed by EDP.

## 2013-01-24 NOTE — ED Provider Notes (Signed)
History     This chart was scribed for Garrett Lennert, MD, MD by Smitty Pluck, ED Scribe. The patient was seen in room APA02/APA02 and the patient's care was started at 7:21 AM.   CSN: [id number]  Arrival date & time 02/04/2013  4010      Chief Complaint  Patient presents with  . Respiratory Distress    Patient is a 79 y.o. male presenting with shortness of breath. The history is provided by the nursing home and medical records. No language interpreter was used.  Shortness of Breath Severity:  Severe Onset quality:  Sudden Progression:  Unchanged Chronicity:  Chronic Relieved by:  Nothing Worsened by:  Nothing tried Pt is level 5 caveat due to poor historian.  Garrett Reese is a 79 y.o. male with hx of CHF, COPD, lung cancer, HTN, DM, atrial fibrillation and CKD who presents to the Emergency Department BIB EMS from [facility] accompanied by nursing staff due to respiratory distress today. EMS was called out due to cardiac arrest this morning. Nurse reports that pt was turned over to weigh and turned blue in the face.  Staff reports that they started CPR (about 4 sets of compressions) and pt became alert. Per EMS pt was in respiratory distress upon arrival. Pt has had pneumonia multiple times since August 2013. Pt had part of lung removed in 1991.    Past Medical History  Diagnosis Date  . Dyslipidemia   . Hypertension   . Lung cancer   . DM type 2 (diabetes mellitus, type 2)   . COPD (chronic obstructive pulmonary disease)   . Atrial fibrillation   . CHF (congestive heart failure)   . CKD (chronic kidney disease)     Past Surgical History  Procedure Laterality Date  . Cholecystectomy    . Lung removal, partial      1991    Family History  Problem Relation Age of Onset  . Heart attack Father     History  Substance Use Topics  . Smoking status: Former Smoker -- 2.00 packs/day for 45 years    Types: Cigarettes    Quit date: 12/09/1979  . Smokeless  tobacco: Not on file  . Alcohol Use: Not on file      Review of Systems  Unable to perform ROS   Allergies  Captopril  Home Medications   Current Outpatient Rx  Name  Route  Sig  Dispense  Refill  . albuterol (PROVENTIL HFA;VENTOLIN HFA) 108 (90 BASE) MCG/ACT inhaler   Inhalation   Inhale 2 puffs into the lungs every 6 (six) hours as needed.   1 Inhaler   3   . aspirin 81 MG chewable tablet   Oral   Chew 81 mg by mouth daily.         Marland Kitchen atorvastatin (LIPITOR) 20 MG tablet   Oral   Take 20 mg by mouth daily.         . budesonide-formoterol (SYMBICORT) 160-4.5 MCG/ACT inhaler   Inhalation   Inhale 2 puffs into the lungs 2 (two) times daily.           . fexofenadine (ALLEGRA) 180 MG tablet   Oral   Take 180 mg by mouth daily as needed.           . furosemide (LASIX) 40 MG tablet   Oral   Take 1 tablet (40 mg total) by mouth daily.   30 tablet      . metoprolol (  LOPRESSOR) 50 MG tablet   Oral   Take 50 mg by mouth 2 (two) times daily.         . OXYGEN-HELIUM IN   Inhalation   Inhale 3 L/min into the lungs daily.         . phenazopyridine (PYRIDIUM) 200 MG tablet   Oral   Take 1 tablet (200 mg total) by mouth 3 (three) times daily as needed (dysuria).   10 tablet      . tiotropium (SPIRIVA) 18 MCG inhalation capsule   Inhalation   Place 18 mcg into inhaler and inhale daily.           . traMADol (ULTRAM) 50 MG tablet   Oral   Take 1 tablet (50 mg total) by mouth every 6 (six) hours as needed for pain.   30 tablet   0     Temp(Src) 97.8 F (36.6 C) (Rectal)  Resp 23  SpO2 98%  Physical Exam  Nursing note and vitals reviewed. Constitutional: He appears well-developed.  HENT:  Head: Normocephalic and atraumatic.  Eyes: Conjunctivae and EOM are normal. No scleral icterus.  Neck: Neck supple. No thyromegaly present.  Cardiovascular: Normal rate and regular rhythm.  Exam reveals no gallop and no friction rub.   No murmur  heard. Pulmonary/Chest: No stridor. He has no wheezes. He exhibits no tenderness.  Crackles bilaterally    Abdominal: He exhibits no distension. There is no tenderness. There is no rebound.  Musculoskeletal: Normal range of motion. He exhibits edema (+2 bilateral legs).  Lymphadenopathy:    He has no cervical adenopathy.  Neurological: He is alert. Coordination normal.  Confused   Skin: No rash noted. No erythema.  Psychiatric: He has a normal mood and affect. His behavior is normal.    ED Course  CENTRAL LINE Date/Time: 01/31/2013 9:03 AM Performed by: Adonias Demore L Authorized by: Bethann Berkshire L Comments: Pt had an external jugular iv placed in right side of neck without problems.  20 gauge iv.  Done by Physician Dr.Lenis Nettleton   (including critical care time) DIAGNOSTIC STUDIES: Oxygen Saturation is 98% on [place], normal by my interpretation.    COORDINATION OF CARE: 7:26 AM Discussed ED treatment with [facility] nursing staff and nursing staff agrees.  8:30 AM Ordered:  Medications  albuterol (PROVENTIL) (5 MG/ML) 0.5% nebulizer solution 5 mg (5 mg Nebulization Given 02/01/2013 0805)  ipratropium (ATROVENT) nebulizer solution 0.5 mg (0.5 mg Nebulization Given 01/09/2013 0805)       Labs Reviewed  GLUCOSE, CAPILLARY - Abnormal; Notable for the following:    Glucose-Capillary 227 (*)    All other components within normal limits  CBC WITH DIFFERENTIAL - Abnormal; Notable for the following:    WBC 18.0 (*)    RBC 4.19 (*)    Hemoglobin 12.5 (*)    Platelets 116 (*)    Neutrophils Relative 87 (*)    Neutro Abs 15.6 (*)    Lymphocytes Relative 6 (*)    Monocytes Absolute 1.2 (*)    All other components within normal limits  COMPREHENSIVE METABOLIC PANEL - Abnormal; Notable for the following:    Glucose, Bld 217 (*)    BUN 58 (*)    Creatinine, Ser 1.57 (*)    Albumin 3.0 (*)    AST 50 (*)    Alkaline Phosphatase 153 (*)    GFR calc non Af Amer 37 (*)    GFR calc Af Amer 43  (*)    All other  components within normal limits  LACTIC ACID, PLASMA - Abnormal; Notable for the following:    Lactic Acid, Venous 4.4 (*)    All other components within normal limits  PRO B NATRIURETIC PEPTIDE - Abnormal; Notable for the following:    Pro B Natriuretic peptide (BNP) 8959.0 (*)    All other components within normal limits  TROPONIN I  URINALYSIS, ROUTINE W REFLEX MICROSCOPIC   Dg Chest Portable 1 View  01/11/2013  *RADIOLOGY REPORT*  Clinical Data: Respiratory distress.  PORTABLE CHEST - 1 VIEW  Comparison: Chest x-ray 01/05/2013.  Findings: Elevation of the right hemidiaphragm.  Small bilateral pleural effusions.  Bibasilar opacities (right greater than left), favored to predominately reflect subsegmental atelectasis, although underlying air space consolidation is difficult to entirely exclude.  Interstitial prominence throughout the right mid and upper lung is unchanged.  Mild cardiomegaly. The patient is rotated to the right on today's exam, resulting in distortion of the mediastinal contours and reduced diagnostic sensitivity and specificity for mediastinal pathology.  IMPRESSION: 1.  Overall, the radiographic appearance of the chest is similar, including bibasilar areas of atelectasis and/or consolidation, small bilateral pleural effusions, elevation of the right hemidiaphragm, and asymmetric interstitial predominant opacities in the right mid to upper lung, concerning for infection.   Original Report Authenticated By: Trudie Reed, M.D.      No diagnosis found.   Date: 01/16/2013  [id number]  Rhythm: atrial fibrillation  QRS Axis: normal  Intervals: normal  ST/T Wave abnormalities: nonspecific ST changes  Conduction Disutrbances:none  Narrative Interpretation:   Old EKG Reviewed: unchanged  CRITICAL CARE Performed by: Kamarii Buren L   Total critical care time:35  Critical care time was exclusive of separately billable procedures and treating other  patients.  Critical care was necessary to treat or prevent imminent or life-threatening deterioration.  Critical care was time spent personally by me on the following activities: development of treatment plan with patient and/or surrogate as well as nursing, discussions with consultants, evaluation of patient's response to treatment, examination of patient, obtaining history from patient or surrogate, ordering and performing treatments and interventions, ordering and review of laboratory studies, ordering and review of radiographic studies, pulse oximetry and re-evaluation of patient's condition.   MDM  Syncope unknown cause,  Will treat for pneumonia   The chart was scribed for me under my direct supervision.  I personally performed the history, physical, and medical decision making and all procedures in the evaluation of this patient.Garrett Lennert, MD 01/12/2013 [phone]

## 2013-01-24 NOTE — ED Notes (Signed)
Pt requests to go to [facility], MD at bedside and aware of request.

## 2013-01-24 NOTE — Progress Notes (Signed)
ANTIBIOTIC CONSULT NOTE - INITIAL  Pharmacy Consult for Vancomycin and Zosyn Indication: rule out pneumonia  Allergies  Allergen Reactions  . Captopril     Patient Measurements: Height: 5\' 6"  (167.6 cm) Weight: 198 lb (89.812 kg) IBW/kg (Calculated) : 63.8  Vital Signs: Temp: 97.5 F (36.4 C) (02/17 1709) Temp src: Rectal (02/17 0703) BP: 108/64 mmHg (02/17 1709) Pulse Rate: 88 (02/17 1709) Intake/Output from previous day:   Intake/Output from this shift:    Labs:  Recent Labs  02-22-13 0734  WBC 18.0*  HGB 12.5*  PLT 116*  CREATININE 1.57*   Estimated Creatinine Clearance: 33.5 ml/min (by C-G formula based on Cr of 1.57).  Microbiology: No results found for this or any previous visit (from the past 720 hour(s)).  Medical History: Past Medical History  Diagnosis Date  . Dyslipidemia   . Hypertension   . Lung cancer   . DM type 2 (diabetes mellitus, type 2)   . COPD (chronic obstructive pulmonary disease)   . Atrial fibrillation   . CHF (congestive heart failure)   . CKD (chronic kidney disease)     Medications:  Prescriptions prior to admission  Medication Sig Dispense Refill  . albuterol (PROVENTIL HFA;VENTOLIN HFA) 108 (90 BASE) MCG/ACT inhaler Inhale 2 puffs into the lungs every 6 (six) hours as needed.  1 Inhaler  3  . albuterol (PROVENTIL) (2.5 MG/3ML) 0.083% nebulizer solution Take 2.5 mg by nebulization 4 (four) times daily.      Marland Kitchen aspirin 81 MG chewable tablet Chew 81 mg by mouth daily.      Marland Kitchen atorvastatin (LIPITOR) 20 MG tablet Take 20 mg by mouth daily.      . budesonide-formoterol (SYMBICORT) 160-4.5 MCG/ACT inhaler Inhale 2 puffs into the lungs 2 (two) times daily.        . feeding supplement (PRO-STAT SUGAR FREE 64) LIQD Take 30 mLs by mouth 2 (two) times daily.      . Fluticasone-Salmeterol (ADVAIR) 250-50 MCG/DOSE AEPB Inhale 1 puff into the lungs every 12 (twelve) hours.      . furosemide (LASIX) 40 MG tablet Take 1 tablet (40 mg total)  by mouth daily.  30 tablet    . guaiFENesin (MUCINEX) 600 MG 12 hr tablet Take 600 mg by mouth 2 (two) times daily.      Marland Kitchen ipratropium (ATROVENT) 0.02 % nebulizer solution Take 250 mcg by nebulization 4 (four) times daily.      Marland Kitchen ipratropium (ATROVENT) 0.03 % nasal spray Place 2 sprays into the nose 2 (two) times daily.      Marland Kitchen linezolid (ZYVOX) 600 MG tablet Take 600 mg by mouth 2 (two) times daily. 7 days      . loratadine (CLARITIN) 10 MG tablet Take 10 mg by mouth daily.      . metoprolol (LOPRESSOR) 50 MG tablet Take 50 mg by mouth 2 (two) times daily.      . phenazopyridine (PYRIDIUM) 200 MG tablet Take 1 tablet (200 mg total) by mouth 3 (three) times daily as needed (dysuria).  10 tablet    . predniSONE (DELTASONE) 10 MG tablet Take 30 mg by mouth daily. For 2 days.      . temazepam (RESTORIL) 15 MG capsule Take 15 mg by mouth at bedtime as needed for sleep.      Marland Kitchen tiotropium (SPIRIVA) 18 MCG inhalation capsule Place 18 mcg into inhaler and inhale daily.        . traMADol (ULTRAM) 50 MG tablet Take  1 tablet (50 mg total) by mouth every 6 (six) hours as needed for pain.  30 tablet  0  . levofloxacin (LEVAQUIN) 500 MG tablet Take 500 mg by mouth daily. 01/16/13-01/23/2013       Assessment: 79 yo M who was seen at [facility] ED with respiratory distress.  Pt was transferred to [facility] at family request.  Pt is a NH resident and also has recent inpatient admission at [facility] from 1/29-2/5 and there is a concern for HCAP.  Chest xray in ED consistent with B pleural effusions and opacities concerning for infection.  WBC elevated at 18.  SCr also elevated slightly from last admission (1.16 >> 1.57).  Of note, pt completed course of Levaquin yesterday.  Patient also received Vancomycin 1gm and Zosyn 3.375 gm at [facility] ED ~ 0900 this AM.  Goal of Therapy:  Vancomycin trough level 15-20 mcg/ml  Plan:  Vancomycin 1250 mg IV Q24h - next dose due 2/18 at 0900 Zosyn 3.375 gm IV q8h (4 hour infusion) - next dose due  now. Follow-up renal function, culture data, and clinical progress. Vancomycin trough as indicated.  [employer], Pharm.D., BCPS Clinical Pharmacist Pager [phone] 05-Feb-2013 6:13 PM

## 2013-01-24 NOTE — Telephone Encounter (Signed)
Patient admitted this morning to [facility] for possible coding event at the nursing facility. Please see full H&P of this morning. Transferred to [facility] at family's request.

## 2013-01-24 NOTE — ED Notes (Addendum)
After drinking carbonated beverage, pt states he feels like he is unable to breathe.  No change in lung sounds bilaterally.  States he did not get strangled.  Pt does appear anxious.  O2 sat remains 96% on 2 liters O2 via nasal canula, respiratory rate approx 26.

## 2013-01-24 NOTE — Progress Notes (Signed)
RT gave patient his prn flutter per patient request. Pt used flutter about 15 times in 10 minutes. Pt states it helps his lungs and his pulmonary doctor recommends it. Rt will continue to assist as needed.

## 2013-01-24 NOTE — ED Notes (Signed)
Resident at [facility] - per EMS - called out for cardiac arrest.  Staff reported pt was turned over to weigh and turned blue in the face.  Staff states the started CPR with approx 4 sets of compressions when pt became alert.  EMS states pt was in respiratory distress upon their arrival.

## 2013-01-24 NOTE — ED Notes (Signed)
Family states they do not need anything at this time. 

## 2013-01-24 NOTE — H&P (Addendum)
[facility] History and Physical  BRODAN GREWELL FAO:[id number] DOB: 01/26/1921 DOA: 01/09/2013  Referring physician:  PCP: Benita Stabile, MD  Specialists:   Chief Complaint: resp distress  HPI: Garrett Reese is a 79 y.o. male with pmhx of afib, COPD, chronic resp failure O2 dependant, chronic diastolic HR, CKD, PVD, DM who [facility] from [facility] facility with cc respiratory distress. Information obtained from chart ED staff and daughter who is at bedside. [facility] days for treatment afib RVR, HCAP and daughter reports doing well until about 2-3 days ago. She reports noting increased in LEE. Daughter reports concern over diet that pt gets at facility in terms of sodium. She visited yesterday and noted some worsening SOB and coughing. Reportedly this am, staff at nursing facility while repositioning pt in bed for weight, pt "turned blue". Pt placed on back and after 4 compression, it is reported that pt became alert. EMS arrived and assessed respiratory distress. He was transported to ED given nebs and started on antibiotics and has improved. Daughter denies any report of recent fever, chills, nausea/vomiting, complaints of pain or shortness of breath. Daughter reports that pt is usually alert and oriented to self and place, mostly non-ambulatory and can make his needs known. Reportedly pt discharged from hospital with foley catheter that was removed 5 days ago, no report of dysuria, hematuria. No reports of diarrhea, constipation, melena. Symptoms came on suddenly, have improved, characterized as moderate. Work up in ED yields chest xray with bilateral pleural effusions, opacities concerning for infection, WC 18. proBNP 8959, lactic acid 4.4. We are asked to admit.     Review of Systems: The patient denies anorexia, fever, weight loss,, vision loss, decreased hearing, hoarseness, chest pain, syncope, hemoptysis, abdominal pain, melena,  hematochezia, severe indigestion/heartburn, hematuria, incontinence, genital sores, muscle weakness, suspicious skin lesions, transient blindness, depression, unusual weight change, abnormal bleeding, enlarged lymph nodes, angioedema, and breast masses.    Past Medical History  Diagnosis Date  . Dyslipidemia   . Hypertension   . Lung cancer   . DM type 2 (diabetes mellitus, type 2)   . COPD (chronic obstructive pulmonary disease)   . Atrial fibrillation   . CHF (congestive heart failure)   . CKD (chronic kidney disease)    Past Surgical History  Procedure Laterality Date  . Cholecystectomy    . Lung removal, partial      1991   Social History:  reports that he quit smoking about 33 years ago. His smoking use included Cigarettes. He has a 90 pack-year smoking history. He does not have any smokeless tobacco history on file. His alcohol and drug histories are not on file. Pt lives at nursing facility. Mostly wheelchair bound. Oriented to self/place. Able to make needs known.   Allergies  Allergen Reactions  . Captopril     Family History  Problem Relation Age of Onset  . Heart attack Father      Prior to Admission medications   Medication Sig Start Date End Date Taking? Authorizing Provider  albuterol (PROVENTIL HFA;VENTOLIN HFA) 108 (90 BASE) MCG/ACT inhaler Inhale 2 puffs into the lungs every 6 (six) hours as needed. 12/25/11  Yes Tammy S Parrett, NP  albuterol (PROVENTIL) (2.5 MG/3ML) 0.083% nebulizer solution Take 2.5 mg by nebulization 4 (four) times daily.   Yes Historical Provider, MD  aspirin 81 MG chewable tablet Chew 81 mg by mouth daily.   Yes Historical Provider, MD  atorvastatin (LIPITOR) 20 MG tablet Take 20 mg by mouth daily.   Yes Historical Provider, MD  budesonide-formoterol (SYMBICORT) 160-4.5 MCG/ACT inhaler Inhale 2 puffs into the lungs 2 (two) times daily.     Yes Historical Provider, MD  feeding supplement (PRO-STAT SUGAR FREE 64) LIQD Take 30 mLs by mouth 2  (two) times daily.   Yes Historical Provider, MD  Fluticasone-Salmeterol (ADVAIR) 250-50 MCG/DOSE AEPB Inhale 1 puff into the lungs every 12 (twelve) hours.   Yes Historical Provider, MD  furosemide (LASIX) 40 MG tablet Take 1 tablet (40 mg total) by mouth daily. 01/11/13  Yes Syanna Remmert Luanne Bras, MD  guaiFENesin (MUCINEX) 600 MG 12 hr tablet Take 600 mg by mouth 2 (two) times daily.   Yes Historical Provider, MD  ipratropium (ATROVENT) 0.02 % nebulizer solution Take 250 mcg by nebulization 4 (four) times daily.   Yes Historical Provider, MD  ipratropium (ATROVENT) 0.03 % nasal spray Place 2 sprays into the nose 2 (two) times daily.   Yes Historical Provider, MD  linezolid (ZYVOX) 600 MG tablet Take 600 mg by mouth 2 (two) times daily. 7 days   Yes Historical Provider, MD  loratadine (CLARITIN) 10 MG tablet Take 10 mg by mouth daily.   Yes Historical Provider, MD  metoprolol (LOPRESSOR) 50 MG tablet Take 50 mg by mouth 2 (two) times daily.   Yes Historical Provider, MD  phenazopyridine (PYRIDIUM) 200 MG tablet Take 1 tablet (200 mg total) by mouth 3 (three) times daily as needed (dysuria). 01/11/13  Yes Dai Mcadams Luanne Bras, MD  predniSONE (DELTASONE) 10 MG tablet Take 30 mg by mouth daily. For 2 days.   Yes Historical Provider, MD  temazepam (RESTORIL) 15 MG capsule Take 15 mg by mouth at bedtime as needed for sleep.   Yes Historical Provider, MD  tiotropium (SPIRIVA) 18 MCG inhalation capsule Place 18 mcg into inhaler and inhale daily.     Yes Historical Provider, MD  traMADol (ULTRAM) 50 MG tablet Take 1 tablet (50 mg total) by mouth every 6 (six) hours as needed for pain. 01/11/13  Yes Sierah Lacewell Luanne Bras, MD  levofloxacin (LEVAQUIN) 500 MG tablet Take 500 mg by mouth daily. 01/16/13-01/23/2013    Historical Provider, MD   Physical Exam: Filed Vitals:   02/16/2013 0754 February 16, 2013 0816 02-16-13 0821 Feb 16, 2013 0854  BP: 146/77  128/66 126/61  Pulse: 98  94 85  Temp:      TempSrc:      Resp: 24  20 19   SpO2: 99% 93% 99% 95%      General:  Awake alert NAD  Eyes: PERRL EOMI, no scleral icterus  ENT: mouth with dry pink mucus membranes, ears clear, nose without drainage  Neck: supple right JVP intact.   Cardiovascular: Irregular, No MGR 1+ LEE on left, 2+ LEE on right  Respiratory: mild increased work of breathing with conversation. BS with crackles particularly on right. Rhonchi no wheeze  Abdomen: obese soft +BS non-tender to palpation no mass organomegaly  Skin: cool, dry, extremities with bruised. No rash/lesions  Musculoskeletal: MOE no joint swelling/erythema  Psychiatric: mild agitation, cooperative  Neurologic: cranial nerve II-XII intact. Follows commands. Oriented to self only.   Labs on Admission:  Basic Metabolic Panel:  Recent Labs Lab 16-Feb-2013 0734  NA 140  K 4.9  CL 97  CO2 32  GLUCOSE 217*  BUN 58*  CREATININE 1.57*  CALCIUM 9.3   Liver Function Tests:  Recent Labs Lab February 16, 2013 0734  AST 50*  ALT  52  ALKPHOS 153*  BILITOT 0.6  PROT 7.0  ALBUMIN 3.0*   No results found for this basename: LIPASE, AMYLASE,  in the last 168 hours No results found for this basename: AMMONIA,  in the last 168 hours CBC:  Recent Labs Lab Feb 18, 2013 0734  WBC 18.0*  NEUTROABS 15.6*  HGB 12.5*  HCT 40.4  MCV 96.4  PLT 116*   Cardiac Enzymes:  Recent Labs Lab 02/18/13 0734  TROPONINI <0.30    BNP (last 3 results)  Recent Labs  01/05/13 2311 2013/02/18 0734  PROBNP 12233.0* 8959.0*   CBG:  Recent Labs Lab Feb 18, 2013 0706  GLUCAP 227*    Radiological Exams on Admission: Dg Chest Portable 1 View  02/18/13  *RADIOLOGY REPORT*  Clinical Data: Respiratory distress.  PORTABLE CHEST - 1 VIEW  Comparison: Chest x-ray 01/05/2013.  Findings: Elevation of the right hemidiaphragm.  Small bilateral pleural effusions.  Bibasilar opacities (right greater than left), favored to predominately reflect subsegmental atelectasis, although underlying air space consolidation is  difficult to entirely exclude.  Interstitial prominence throughout the right mid and upper lung is unchanged.  Mild cardiomegaly. The patient is rotated to the right on today's exam, resulting in distortion of the mediastinal contours and reduced diagnostic sensitivity and specificity for mediastinal pathology.  IMPRESSION: 1.  Overall, the radiographic appearance of the chest is similar, including bibasilar areas of atelectasis and/or consolidation, small bilateral pleural effusions, elevation of the right hemidiaphragm, and asymmetric interstitial predominant opacities in the right mid to upper lung, concerning for infection.   Original Report Authenticated By: Trudie Reed, M.D.     EKG: Independently reviewed. afib  Assessment/Plan Active Problems:  Acute-on-chronic respiratory failure: in setting of probable volume overload and possible worsening pna and stable COPD. Will admit to tele. Improved since nebs but still with increased oxygen demand.  Will continue nebs. Will provide oxygen support and monitor sats closely.      HCAP (healthcare-associated pneumonia): pt with WC 18 and lactic acid 4.4. VSS. Afebrile. Non-toxic appearing but with increased oxygen demand. Will continue Vanc and Zosyn. Monitor closely  Acute on chronic diastolic heart failure: likely related to non compliance with diet.Echo last month yield EF 50-55% with mild hypokinesis, mild pulmonary HTN and mild LV dilation. Crackles on exam. Pt take 40mg  lasix daily as OP. Will give 40 IV. Strict intake and output, daily weight.     PVD (peripheral vascular disease): stable at baseline. Consulted vascular last hospitalization determined not candidate for surgery.        Chronic kidney disease (CKD), stage III (moderate): baseline creatinine 1.6. Currently close to baseline. Will monitor closely given need for increased diuretics.     A-fib: not coumadin candidate. Continue BB. Monitor on tele.     COPD (chronic obstructive  pulmonary disease): appears at baseline. Will continue OP regimen and meds    DM type 2 (diabetes mellitus, type 2): diet controlled. Will check HgA1c. Carb modified diet. SSI for glycemic control     Code Status: spoke to daughter mary at bedside. She is HCPOA. Pt is full code Family Communication: daughter Corrie Dandy at bedside Disposition Plan: back to facility when ready  Time spent: 65 minutes  [facility]   If 7PM-7AM, please contact night-coverage [email or web address] Password [facility] 02-18-13, 10:00 AM Attending: Patient seen and examined.I think he has acute Diastolic CHF as the primary cause of his respiratory failure. He needs iv diuresis and follow clinically. I did discuss Code status and patient's  daughter wants FULL CODE,although she does not want prolonged mechanical ventilation. Daughter requests transfer of patient to [facility]. My colleague Dr Lafe Garin has kindly accepted the patient in transfer.   Addendum  01/18/2013 5:28 PM Patient arrived from [facility]. Axox3 Chronically ill cyanotic No acute resp distress  Well known to me from prior hospitalization Has severe diastolic chf, copd, chronic respiratory failure, inoperable PVD. Seems to have CHF eacerbation +/-HCAP Would treat with iv lasix and iv abx Garrett Reese

## 2013-01-24 NOTE — ED Notes (Signed)
Pt calmer, states he feels better, no complaints of increased shortness of breath.

## 2013-01-25 ENCOUNTER — Inpatient Hospital Stay (HOSPITAL_COMMUNITY): Payer: Medicare Other

## 2013-01-25 DIAGNOSIS — E119 Type 2 diabetes mellitus without complications: Secondary | ICD-10-CM

## 2013-01-25 DIAGNOSIS — J189 Pneumonia, unspecified organism: Principal | ICD-10-CM

## 2013-01-25 DIAGNOSIS — J449 Chronic obstructive pulmonary disease, unspecified: Secondary | ICD-10-CM

## 2013-01-25 DIAGNOSIS — G9340 Encephalopathy, unspecified: Secondary | ICD-10-CM

## 2013-01-25 LAB — URINALYSIS, ROUTINE W REFLEX MICROSCOPIC
Ketones, ur: 15 mg/dL — AB
Nitrite: POSITIVE — AB
pH: 5 (ref 5.0–8.0)

## 2013-01-25 LAB — BLOOD GAS, ARTERIAL
Drawn by: 36274
MECHVT: 500 mL
PEEP: 5 cmH2O
Patient temperature: 98.6
RATE: 14 resp/min
pH, Arterial: 7.341 — ABNORMAL LOW (ref 7.350–7.450)

## 2013-01-25 LAB — CBC WITH DIFFERENTIAL/PLATELET
Basophils Absolute: 0 10*3/uL (ref 0.0–0.1)
Basophils Relative: 0 % (ref 0–1)
Eosinophils Absolute: 0 10*3/uL (ref 0.0–0.7)
HCT: 35.8 % — ABNORMAL LOW (ref 39.0–52.0)
Hemoglobin: 11 g/dL — ABNORMAL LOW (ref 13.0–17.0)
MCH: 29.4 pg (ref 26.0–34.0)
MCHC: 30.7 g/dL (ref 30.0–36.0)
Monocytes Absolute: 0.7 10*3/uL (ref 0.1–1.0)
Monocytes Relative: 6 % (ref 3–12)
Neutrophils Relative %: 91 % — ABNORMAL HIGH (ref 43–77)
RDW: 15.3 % (ref 11.5–15.5)

## 2013-01-25 LAB — COMPREHENSIVE METABOLIC PANEL
ALT: 56 U/L — ABNORMAL HIGH (ref 0–53)
AST: 70 U/L — ABNORMAL HIGH (ref 0–37)
Alkaline Phosphatase: 171 U/L — ABNORMAL HIGH (ref 39–117)
CO2: 36 mEq/L — ABNORMAL HIGH (ref 19–32)
Calcium: 8.8 mg/dL (ref 8.4–10.5)
GFR calc non Af Amer: 33 mL/min — ABNORMAL LOW (ref >90)
Glucose, Bld: 177 mg/dL — ABNORMAL HIGH (ref 70–99)
Potassium: 4.9 mEq/L (ref 3.5–5.1)
Sodium: 144 mEq/L (ref 135–145)

## 2013-01-25 LAB — URINE MICROSCOPIC-ADD ON

## 2013-01-25 LAB — GLUCOSE, CAPILLARY
Glucose-Capillary: 167 mg/dL — ABNORMAL HIGH (ref 70–99)
Glucose-Capillary: 200 mg/dL — ABNORMAL HIGH (ref 70–99)

## 2013-01-25 LAB — MRSA PCR SCREENING: MRSA by PCR: NEGATIVE

## 2013-01-25 MED ORDER — SODIUM CHLORIDE 0.9 % IV BOLUS (SEPSIS): 500.0000 mL | Freq: Once | INTRAVENOUS | Status: DC

## 2013-01-25 MED ORDER — SODIUM CHLORIDE 0.9 % IV SOLN
25.0000 ug/h | INTRAVENOUS | Status: DC
  Administered 2013-01-25: 25 ug/h via INTRAVENOUS
  Administered 2013-01-26: 100 ug/h via INTRAVENOUS
  Filled 2013-01-25 (×3): qty 50

## 2013-01-25 MED ORDER — LINEZOLID 2 MG/ML IV SOLN
600.0000 mg | Freq: Two times a day (BID) | INTRAVENOUS | Status: DC
  Administered 2013-01-25 – 2013-01-27 (×5): 600 mg via INTRAVENOUS
  Filled 2013-01-25 (×6): qty 300

## 2013-01-25 MED ORDER — INSULIN ASPART 100 UNIT/ML ~~LOC~~ SOLN
0.0000 [IU] | SUBCUTANEOUS | Status: DC
  Administered 2013-01-25 (×3): 2 [IU] via SUBCUTANEOUS

## 2013-01-25 MED ORDER — SODIUM CHLORIDE 0.9 % IV BOLUS (SEPSIS)
500.0000 mL | Freq: Once | INTRAVENOUS | Status: AC
  Administered 2013-01-25: 500 mL via INTRAVENOUS

## 2013-01-25 MED ORDER — OSMOLITE 1.2 CAL PO LIQD
1000.0000 mL | ORAL | Status: DC
  Filled 2013-01-25 (×2): qty 1000

## 2013-01-25 MED ORDER — PANTOPRAZOLE SODIUM 40 MG IV SOLR
40.0000 mg | INTRAVENOUS | Status: DC
  Administered 2013-01-25 – 2013-01-28 (×4): 40 mg via INTRAVENOUS
  Filled 2013-01-25 (×6): qty 40

## 2013-01-25 MED ORDER — SODIUM CHLORIDE 0.9 % IV BOLUS (SEPSIS)
750.0000 mL | Freq: Once | INTRAVENOUS | Status: AC
  Administered 2013-01-25: 750 mL via INTRAVENOUS

## 2013-01-25 MED ORDER — PIPERACILLIN-TAZOBACTAM 3.375 G IVPB
3.3750 g | Freq: Three times a day (TID) | INTRAVENOUS | Status: DC
  Administered 2013-01-25 – 2013-01-28 (×8): 3.375 g via INTRAVENOUS
  Filled 2013-01-25 (×10): qty 50

## 2013-01-25 MED ORDER — ALPRAZOLAM 0.5 MG PO TABS
1.0000 mg | ORAL_TABLET | Freq: Every evening | ORAL | Status: AC | PRN
  Administered 2013-01-25: 1 mg via ORAL
  Filled 2013-01-25: qty 2

## 2013-01-25 MED ORDER — FENTANYL CITRATE 0.05 MG/ML IJ SOLN
100.0000 ug | INTRAMUSCULAR | Status: DC | PRN
  Administered 2013-01-25: 100 ug via INTRAVENOUS

## 2013-01-25 MED ORDER — LEVOFLOXACIN IN D5W 750 MG/150ML IV SOLN
750.0000 mg | INTRAVENOUS | Status: DC
  Administered 2013-01-25: 750 mg via INTRAVENOUS
  Filled 2013-01-25: qty 150

## 2013-01-25 MED ORDER — CHLORHEXIDINE GLUCONATE 0.12 % MT SOLN
15.0000 mL | Freq: Two times a day (BID) | OROMUCOSAL | Status: DC
  Administered 2013-01-25 – 2013-01-28 (×8): 15 mL via OROMUCOSAL
  Filled 2013-01-25 (×8): qty 15

## 2013-01-25 MED ORDER — FUROSEMIDE 10 MG/ML IJ SOLN: 40.0000 mg | Freq: Two times a day (BID) | INTRAMUSCULAR | Status: DC

## 2013-01-25 MED ORDER — INSULIN ASPART 100 UNIT/ML ~~LOC~~ SOLN
0.0000 [IU] | SUBCUTANEOUS | Status: DC
  Administered 2013-01-25 – 2013-01-26 (×2): 5 [IU] via SUBCUTANEOUS
  Administered 2013-01-26 (×2): 2 [IU] via SUBCUTANEOUS
  Administered 2013-01-26 – 2013-01-27 (×3): 3 [IU] via SUBCUTANEOUS
  Administered 2013-01-27: 5 [IU] via SUBCUTANEOUS
  Administered 2013-01-27: 3 [IU] via SUBCUTANEOUS
  Administered 2013-01-27: 5 [IU] via SUBCUTANEOUS
  Administered 2013-01-27: 8 [IU] via SUBCUTANEOUS
  Administered 2013-01-27: 3 [IU] via SUBCUTANEOUS
  Administered 2013-01-28: 8 [IU] via SUBCUTANEOUS

## 2013-01-25 MED ORDER — METHYLPREDNISOLONE SODIUM SUCC 40 MG IJ SOLR
40.0000 mg | Freq: Two times a day (BID) | INTRAMUSCULAR | Status: DC
  Administered 2013-01-25 – 2013-01-28 (×7): 40 mg via INTRAVENOUS
  Filled 2013-01-25 (×9): qty 1

## 2013-01-25 MED ORDER — MIDAZOLAM HCL 5 MG/ML IJ SOLN
2.0000 mg | Freq: Once | INTRAMUSCULAR | Status: AC
Start: 2013-01-25 — End: 2013-01-25
  Administered 2013-01-25: 2 mg via INTRAVENOUS

## 2013-01-25 MED ORDER — GLUCERNA 1.2 CAL PO LIQD
1000.0000 mL | ORAL | Status: DC
  Administered 2013-01-25 – 2013-01-27 (×2): 1000 mL
  Filled 2013-01-25 (×5): qty 1000

## 2013-01-25 MED ORDER — IPRATROPIUM-ALBUTEROL 20-100 MCG/ACT IN AERS: 6.0000 | INHALATION_SPRAY | RESPIRATORY_TRACT | Status: DC | PRN

## 2013-01-25 MED ORDER — SODIUM CHLORIDE 0.9 % IV SOLN
1.0000 mg/h | INTRAVENOUS | Status: DC
  Administered 2013-01-25: 2 mg/h via INTRAVENOUS
  Filled 2013-01-25: qty 10

## 2013-01-25 MED ORDER — IPRATROPIUM BROMIDE 0.02 % IN SOLN
0.5000 mg | RESPIRATORY_TRACT | Status: DC
  Administered 2013-01-25 – 2013-01-28 (×19): 0.5 mg via RESPIRATORY_TRACT
  Filled 2013-01-25 (×20): qty 2.5

## 2013-01-25 MED ORDER — PRO-STAT SUGAR FREE PO LIQD
60.0000 mL | Freq: Three times a day (TID) | ORAL | Status: DC
  Administered 2013-01-25 – 2013-01-28 (×10): 60 mL
  Filled 2013-01-25 (×12): qty 60

## 2013-01-25 MED ORDER — HEPARIN SODIUM (PORCINE) 5000 UNIT/ML IJ SOLN
5000.0000 [IU] | Freq: Three times a day (TID) | INTRAMUSCULAR | Status: DC
  Administered 2013-01-25 – 2013-01-26 (×3): 5000 [IU] via SUBCUTANEOUS
  Filled 2013-01-25 (×6): qty 1

## 2013-01-25 MED ORDER — ALBUTEROL SULFATE (5 MG/ML) 0.5% IN NEBU
2.5000 mg | INHALATION_SOLUTION | RESPIRATORY_TRACT | Status: DC
  Administered 2013-01-25 – 2013-01-28 (×19): 2.5 mg via RESPIRATORY_TRACT
  Filled 2013-01-25 (×20): qty 0.5

## 2013-01-25 MED ORDER — IPRATROPIUM-ALBUTEROL 20-100 MCG/ACT IN AERS
6.0000 | INHALATION_SPRAY | RESPIRATORY_TRACT | Status: DC
  Filled 2013-01-25: qty 4

## 2013-01-25 MED ORDER — BIOTENE DRY MOUTH MT LIQD
1.0000 "application " | Freq: Four times a day (QID) | OROMUCOSAL | Status: DC
  Administered 2013-01-25 – 2013-01-28 (×14): 15 mL via OROMUCOSAL

## 2013-01-25 MED FILL — Medication: Qty: 1 | Status: AC

## 2013-01-25 NOTE — Consult Note (Signed)
WOC consult Note Reason for Consult: multiple skin tears over this patients upper and lower extremities.  Pt has two large skin tears of the left and right upper extremities, skin flaps are present but unable to be reaproximated.  The areas are oozing quite a bit of sanguinous drainage.  Two smaller skin tears noted right upper arm anterior and posterior.  This pts entire upper bilateral arms are covered with purpura and ecchymosis along with edema which makes pt high risk for skin tears.  Bilateral knees have scabbed areas one open area noted left pretibial (skin tear). Wife does report multiple falls and pt on long term anticoagulation tx.   Wound type: Multiple skin tears over upper and lower extremities Wound bed: all of the areas are darkened covered with clotted blood, moist and do not appear to be purulent.  Dressing procedure/placement/frequency: Vaseline gauze for the two larger skin tears of the upper extremities to limit adherence of any dressings to the arms.  Ok to continue soft silicone foam dressings for the smaller skin tears that are requiring less frequent dressing changes.   Re consult if needed, will not follow at this time. Thanks  Keshia Weare [employer], CWOCN [phone])

## 2013-01-25 NOTE — Progress Notes (Signed)
Pt assessed with Loraine Leriche, RN at bedside at shift change. Checked fent/versed drip settings. Will continue care for pt.

## 2013-01-25 NOTE — Progress Notes (Signed)
INITIAL NUTRITION ASSESSMENT  DOCUMENTATION CODES Per approved criteria  -Obesity Unspecified   INTERVENTION:  Initiate TF via OG tube with Glucerna 1.2 at 10 ml/h, increase by 10 ml every 4 hours to goal rate of 30 ml/h with Prostat 60 ml TID to provide 1464 kcals (23 kcals/kg ideal weight), 133 gm protein, 580 ml free water daily.  NUTRITION DIAGNOSIS: Inadequate oral intake related to inability to eat as evidenced by NPO status.   Goal: Enteral nutrition to provide 60-70% of estimated calorie needs (22-25 kcals/kg ideal body weight) and >/= 90% of estimated protein needs, based on ASPEN guidelines for permissive underfeeding in critically ill obese individuals.  Monitor:  TF tolerance/adequacy, weight trend, labs, vent status  Reason for Assessment: MD Consult for TF initiation and management.  79 y.o. male  Admitting Dx: Acute on chronic respiratory failure related to COPD with exacerbation  ASSESSMENT: Patient was admitted 2/17 with HCAP vs aspiration pneumonia, developed respiratory arrest on the floor last night, intubated by anesthesia and brought to ICU for further management.  Received consult for TF initiation and management.    Patient is currently intubated on ventilator support.  MV: 6.3 Temp:Temp (24hrs), Avg:97.9 F (36.6 C), Min:97.5 F (36.4 C), Max:98.6 F (37 C)    Height: Ht Readings from Last 1 Encounters:  01/25/13 5\' 6"  (1.676 m)    Weight: Wt Readings from Last 1 Encounters:  01/25/13 201 lb 15.1 oz (91.6 kg)    Ideal Body Weight: 64.5 kg  % Ideal Body Weight: 142%  Wt Readings from Last 10 Encounters:  01/25/13 201 lb 15.1 oz (91.6 kg)  01/12/13 192 lb 3.2 oz (87.181 kg)  01/12/13 192 lb 3.2 oz (87.181 kg)  11/12/12 203 lb (92.08 kg)  09/24/12 212 lb 12.8 oz (96.525 kg)  05/26/12 227 lb (102.967 kg)  01/23/12 237 lb 6.4 oz (107.684 kg)  12/25/11 236 lb 3.2 oz (107.14 kg)  08/20/11 235 lb 6.4 oz (106.777 kg)  02/19/11 238 lb 4 oz  (108.069 kg)    Usual Body Weight: 192-203 lb  % Usual Body Weight: 100%  BMI:  Body mass index is 32.61 kg/(m^2). class 1 obesity  Estimated Nutritional Needs: Kcal: 1550 Protein: 125-140 gm Fluid: 1.6 L  Skin: multiple skin tears from recent fall  Diet Order: NPO  EDUCATION NEEDS: -Education not appropriate at this time   Intake/Output Summary (Last 24 hours) at 01/25/13 0830 Last data filed at 01/25/13 0700  Gross per 24 hour  Intake 246.82 ml  Output    550 ml  Net -303.18 ml    Last BM: 2/16   Labs:   Recent Labs Lab 01/27/2013 0734 01/25/13 0301  NA 140 144  K 4.9 4.9  CL 97 98  CO2 32 36*  BUN 58* 64*  CREATININE 1.57* 1.72*  CALCIUM 9.3 8.8  GLUCOSE 217* 177*    CBG (last 3)   Recent Labs  01/15/2013 0706 01/25/13 0307  GLUCAP 227* 176*    Scheduled Meds: . albuterol  2.5 mg Nebulization Q4H  . antiseptic oral rinse  1 application Mouth Rinse QID  . aspirin  81 mg Oral Daily  . atorvastatin  20 mg Oral Daily  . chlorhexidine  15 mL Mouth/Throat BID  . heparin subcutaneous  5,000 Units Subcutaneous Q8H  . ipratropium  0.5 mg Nebulization Q4H  . levofloxacin (LEVAQUIN) IV  750 mg Intravenous Q48H  . linezolid  600 mg Intravenous Q12H  . methylPREDNISolone (SOLU-MEDROL) injection  40  mg Intravenous Q12H  . pantoprazole (PROTONIX) IV  40 mg Intravenous Q24H  . piperacillin-tazobactam (ZOSYN)  IV  3.375 g Intravenous Q8H  . sodium chloride  500 mL Intravenous Once    Continuous Infusions: . feeding supplement (OSMOLITE 1.2 CAL)    . fentaNYL infusion INTRAVENOUS 25 mcg/hr (01/25/13 0354)    Past Medical History  Diagnosis Date  . Dyslipidemia   . Hypertension   . Lung cancer   . DM type 2 (diabetes mellitus, type 2)   . COPD (chronic obstructive pulmonary disease)   . Atrial fibrillation   . CHF (congestive heart failure)   . CKD (chronic kidney disease)     Past Surgical History  Procedure Laterality Date  . Cholecystectomy     . Lung removal, partial      1991    [name], [place], LDN, CNSC Pager# [phone] After Hours Pager# [phone]

## 2013-01-25 NOTE — Progress Notes (Signed)
MD aware of bloody urine and bloody secretions in subglottic suction. Will continue to monitor.

## 2013-01-25 NOTE — Progress Notes (Signed)
01/25/2013 0230 Returned from break and was notified by [employer] teacher  that pt. HR had decreased to the 40's non-sustained  and appeared to have a pause of 2.68 seconds and was told to go into the patient's room. Once there patient appeared to be having some respiratory distress other staff entered the room as well. VS were taken and  patients was ranging 58-60's %. Patient also appeared to have taken off his oxygen that he had on earlier. HR remained in the 60's on the monitor. Code was called. Patient continued to have weak pulse throughout code  Pt placed on an non-rebreather mask. Respiratory called as well as on call attending physician notified. Patient was stabilized and sent to ICU. Was notified after code by secretary that patient had another pause during the code of 4.78 seconds. Patient tolerated transport to ICU well no further acute episode happened during transport.

## 2013-01-25 NOTE — Consult Note (Signed)
PULMONARY  / CRITICAL CARE MEDICINE  Name: Garrett Reese MRN: [id number] DOB: [birth year]    ADMISSION DATE:  02/04/2013 CONSULTATION DATE:  01/25/2013  REFERRING MD :  TRH  CHIEF COMPLAINT:  Acute respiratory failure  BRIEF PATIENT DESCRIPTION:  79 yo with COPD, lung CA s/p lobectomy? And recent hospitalization admitted with HCAP who developed respiratory arrest on the floor, intubated by anesthesia and brought to ICU for further management.  SIGNIFICANT EVENTS / STUDIES:  2/17  Admitted with CAP 2/18  Respiratory arrest, intubated, brought to ICU  LINES / TUBES: Foley 2/17 >>> OETT 2/18 >>> OGT 2/18 >>>  CULTURES: 2/18  Blood >>> 2/18  Respiratory >>>  ANTIBIOTICS: Zosyn 2/17 >>> Vancomycin 2/17 >>> Levaquin 2/18 >>>  The patient is encephalopathic and unable to provide history, which was obtained for available medical records.  HISTORY OF PRESENT ILLNESS:  79 yo with COPD / CHF / lung CA s/p lobectomy? admitted with HCAP who developed respiratory arrest on the floor, intubated by anesthesia and brought to ICU for further management.  PAST MEDICAL HISTORY :  Past Medical History  Diagnosis Date  . Dyslipidemia   . Hypertension   . Lung cancer   . DM type 2 (diabetes mellitus, type 2)   . COPD (chronic obstructive pulmonary disease)   . Atrial fibrillation   . CHF (congestive heart failure)   . CKD (chronic kidney disease)    Past Surgical History  Procedure Laterality Date  . Cholecystectomy    . Lung removal, partial      1991   Prior to Admission medications   Medication Sig Start Date End Date Taking? Authorizing Provider  albuterol (PROVENTIL HFA;VENTOLIN HFA) 108 (90 BASE) MCG/ACT inhaler Inhale 2 puffs into the lungs every 6 (six) hours as needed. 12/25/11  Yes Tammy S Parrett, NP  albuterol (PROVENTIL) (2.5 MG/3ML) 0.083% nebulizer solution Take 2.5 mg by nebulization 4 (four) times daily.   Yes Historical Provider, MD  aspirin 81 MG chewable tablet  Chew 81 mg by mouth daily.   Yes Historical Provider, MD  atorvastatin (LIPITOR) 20 MG tablet Take 20 mg by mouth daily.   Yes Historical Provider, MD  budesonide-formoterol (SYMBICORT) 160-4.5 MCG/ACT inhaler Inhale 2 puffs into the lungs 2 (two) times daily.     Yes Historical Provider, MD  feeding supplement (PRO-STAT SUGAR FREE 64) LIQD Take 30 mLs by mouth 2 (two) times daily.   Yes Historical Provider, MD  Fluticasone-Salmeterol (ADVAIR) 250-50 MCG/DOSE AEPB Inhale 1 puff into the lungs every 12 (twelve) hours.   Yes Historical Provider, MD  furosemide (LASIX) 40 MG tablet Take 1 tablet (40 mg total) by mouth daily. 01/11/13  Yes Sorin Luanne Bras, MD  guaiFENesin (MUCINEX) 600 MG 12 hr tablet Take 600 mg by mouth 2 (two) times daily.   Yes Historical Provider, MD  ipratropium (ATROVENT) 0.02 % nebulizer solution Take 250 mcg by nebulization 4 (four) times daily.   Yes Historical Provider, MD  ipratropium (ATROVENT) 0.03 % nasal spray Place 2 sprays into the nose 2 (two) times daily.   Yes Historical Provider, MD  linezolid (ZYVOX) 600 MG tablet Take 600 mg by mouth 2 (two) times daily. 7 days   Yes Historical Provider, MD  loratadine (CLARITIN) 10 MG tablet Take 10 mg by mouth daily.   Yes Historical Provider, MD  metoprolol (LOPRESSOR) 50 MG tablet Take 50 mg by mouth 2 (two) times daily.   Yes Historical Provider, MD  phenazopyridine (PYRIDIUM) 200  MG tablet Take 1 tablet (200 mg total) by mouth 3 (three) times daily as needed (dysuria). 01/11/13  Yes Sorin Luanne Bras, MD  predniSONE (DELTASONE) 10 MG tablet Take 30 mg by mouth daily. For 2 days.   Yes Historical Provider, MD  temazepam (RESTORIL) 15 MG capsule Take 15 mg by mouth at bedtime as needed for sleep.   Yes Historical Provider, MD  tiotropium (SPIRIVA) 18 MCG inhalation capsule Place 18 mcg into inhaler and inhale daily.     Yes Historical Provider, MD  traMADol (ULTRAM) 50 MG tablet Take 1 tablet (50 mg total) by mouth every 6 (six) hours as  needed for pain. 01/11/13  Yes Sorin Luanne Bras, MD  levofloxacin (LEVAQUIN) 500 MG tablet Take 500 mg by mouth daily. 01/16/13-01/23/2013    Historical Provider, MD   Allergies  Allergen Reactions  . Captopril    FAMILY HISTORY:  Family History  Problem Relation Age of Onset  . Heart attack Father    SOCIAL HISTORY:  reports that he quit smoking about 33 years ago. His smoking use included Cigarettes. He has a 90 pack-year smoking history. He does not have any smokeless tobacco history on file. His alcohol and drug histories are not on file.  REVIEW OF SYSTEMS:  Unable to provide.  INTERVAL HISTORY:  VITAL SIGNS: Temp:  [97.5 F (36.4 C)-97.8 F (36.6 C)] 97.7 F (36.5 C) (02/17 2219) Pulse Rate:  [81-98] 91 (02/17 2219) Resp:  [17-24] 22 (02/17 2219) BP: (108-146)/(52-87) 122/87 mmHg (02/17 2219) SpO2:  [92 %-100 %] 94 % (02/17 2219) Weight:  [89.812 kg (198 lb)] 89.812 kg (198 lb) (02/17 1712) HEMODYNAMICS:   VENTILATOR SETTINGS:   INTAKE / OUTPUT: Intake/Output     02/17 0701 - 02/18 0700   I.V. (mL/kg) 3 (0)   Total Intake(mL/kg) 3 (0)   Urine (mL/kg/hr) 425 (0.2)   Total Output 425   Net -422        PHYSICAL EXAMINATION: General:  Appears acutely ill, mechanically ventilated, synchronous Neuro:  Encephalopathic, nonfocal, cough / gag diminished HEENT:  PERRL, OETT / OGT Cardiovascular:  RRR, no m/r/g Lungs:  Bilateral diminished air entry, wheezes / rhonchi Abdomen:  Soft, nontender, bowel sounds diminished Musculoskeletal:  Moves all extremities, no edema Skin: Multiple ecchymoses  LABS:  Recent Labs Lab 01/14/2013 0734 01/27/2013 1721 01/27/2013 2350  HGB 12.5*  --   --   WBC 18.0*  --   --   PLT 116*  --   --   NA 140  --   --   K 4.9  --   --   CL 97  --   --   CO2 32  --   --   GLUCOSE 217*  --   --   BUN 58*  --   --   CREATININE 1.57*  --   --   CALCIUM 9.3  --   --   AST 50*  --   --   ALT 52  --   --   ALKPHOS 153*  --   --   BILITOT 0.6  --    --   PROT 7.0  --   --   ALBUMIN 3.0*  --   --   LATICACIDVEN 4.4*  --   --   TROPONINI <0.30 <0.30 <0.30  PROBNP 8959.0*  --   --     Recent Labs Lab 01/09/2013 0706  GLUCAP 227*   CXR:  2/18 >>> ETT in  place, bilateral airspace disease / effusions.  ASSESSMENT / PLAN:  PULMONARY A:  Acute on chronic respiratory failure.  COPD with exacerbation.  HCAP vs aspiration pneumonia. Cor pulmonale by recent echo (PAP 39). P:   Gaol SpO2>92, pH>7.30 Full mechanical support Daily SBT Trend ABG / CXR Combivent Solu-Medrol D/c Spiriva, Symbicort, Advair  CARDIOVASCULAR A: Transient hypotension post intubation / sedation.  CHF by record, but recent echo demonstrated normal systolic function and no comment on diastolic dysfunction.  P:  Goal MAP>60 Hold Metoprolol Continue ASA, Lipitor Trend troponin  RENAL A:  Acute on chronic renal failure. P:   Trend BMP D/c Lasix  GASTROINTESTINAL A:  Possible dysphagia. P:   NPO as intubated TF if remains intubated > 24 hours Protonix for GI Px  HEMATOLOGIC A:  No active issues. P:  Trend CBC Lovenox for DVT Px  INFECTIOUS A:  HCAP vs aspiration pneumonia. P:   Cultures and antibiotics as above PCT  ENDOCRINE  A:   DM2.  Hyperglycemia. P:   ICU Glycemic Control Protocol Phas 1 PCT  NEUROLOGIC A:  Acute encephalopathy. P:   Goal RASS 0 to -1 Fentanyl / Versed gtt  TODAY'S SUMMARY: 89 with COPD, s/p lobectomy admitted for HCAP / aspiration pneumonia.  Intubated after respiratory Tonight: mchanical support.  Bronchodilators / steroids.  Cultures / broad spectrum antibiotics.  Needs goals of care discussion.  I have personally obtained a history, examined the patient, evaluated laboratory and imaging results, formulated the assessment and plan and placed orders.  CRITICAL CARE:  The patient is critically ill with multiple organ systems failure and requires high complexity decision making for assessment and support,  frequent evaluation and titration of therapies, application of advanced monitoring technologies and extensive interpretation of multiple databases. Critical Care Time devoted to patient care services described in this note is 45 minutes.   Lonia Farber, MD  Pulmonary and Critical Care Medicine [facility] Pager: [phone]  01/25/2013, 3:22 AM

## 2013-01-25 NOTE — Progress Notes (Signed)
Rounded with daytime RN.Drips checked.Bedside report done.

## 2013-01-25 NOTE — Code Documentation (Signed)
CODE BLUE NOTE  Patient Name: Garrett Reese   MRN: [id number]   Date of Birth/ Sex: 1921/05/08 , male      Admission Date: 01/22/2013  Attending Provider: Micael Hampshire Acost*  Primary Diagnosis: <principal problem not specified>    Indication: Pt was in his usual state of health until this PM, when he was noted to be bradycardic w HR in 40s, with diminished pulse and in respiratory distress. Code blue was subsequently called. At the time of arrival on scene, ACLS protocol was underway.    Technical Description:  - CPR performance duration:  No compressions, patient did not lose pulse  - Was defibrillation or cardioversion used? No   - Was external pacer placed? No  - Was patient intubated pre/post CPR? Yes    Medications Administered: Y = Yes; Blank = No Amiodarone    Atropine    Calcium    Epinephrine    Lidocaine    Magnesium    Norepinephrine    Phenylephrine    Sodium bicarbonate    Vasopressin      Post CPR evaluation:  - Final Status - Was patient successfully resuscitated ? Yes - What is current rhythm? Sinus - What is current hemodynamic status? Stable    Miscellaneous Information:  - Labs sent, including: STAT CBC, Cmet, Troponin, PT/INR, PTT  - Primary team notified?  yes  - Family Notified? yes  - Additional notes/ transfer status: Patient transferred to 2104, care assumed by Dr. Herma Carson (PCCM)        Bronson Curb, MD  01/25/2013, 3:02 AM

## 2013-01-25 NOTE — Progress Notes (Signed)
Bedside report completed. Drip rates verified with day shift nurse. Vital signs stable.

## 2013-01-25 NOTE — Consult Note (Signed)
PULMONARY  / CRITICAL CARE MEDICINE  Name: Garrett Reese MRN: [id number] DOB: November 29, 1921    ADMISSION DATE:  01/20/2013 CONSULTATION DATE:  01/25/2013  REFERRING MD :  TRH  CHIEF COMPLAINT:  Acute respiratory failure  BRIEF PATIENT DESCRIPTION:  79 yo with COPD, lung CA s/p lobectomy? And recent hospitalization admitted with HCAP who developed respiratory arrest on the floor, intubated by anesthesia and brought to ICU for further management.  SIGNIFICANT EVENTS / STUDIES:  2/17  Admitted with CAP 2/18  Respiratory arrest, received CPR at [facility],  intubated, brought to ICU  LINES / TUBES: Foley 2/17 >>> OETT 2/18 >>> OGT 2/18 >>>  CULTURES: 2/18  Blood >>> 2/18  Respiratory >>>  ANTIBIOTICS: Zosyn 2/17 >>> Vancomycin 2/17 >>> Levaquin 2/18 >>>  INTERVAL HISTORY: Vent, comfortable  VITAL SIGNS: Temp:  [97.5 F (36.4 C)-98.6 F (37 C)] 98.6 F (37 C) (02/18 0328) Pulse Rate:  [25-98] 80 (02/18 0700) Resp:  [12-24] 13 (02/18 0630) BP: (75-146)/(40-87) 121/58 mmHg (02/18 0700) SpO2:  [82 %-100 %] 100 % (02/18 0700) FiO2 (%):  [40 %-50 %] 50 % (02/18 0500) Weight:  [89.812 kg (198 lb)-91.6 kg (201 lb 15.1 oz)] 91.6 kg (201 lb 15.1 oz) (02/18 0400) HEMODYNAMICS:   VENTILATOR SETTINGS: Vent Mode:  [-] PRVC FiO2 (%):  [40 %-50 %] 50 % Set Rate:  [14 bmp] 14 bmp Vt Set:  [500 mL] 500 mL PEEP:  [5 cmH20] 5 cmH20 Plateau Pressure:  [25 cmH20] 25 cmH20 INTAKE / OUTPUT: Intake/Output     02/17 0701 - 02/18 0700 02/18 0701 - 02/19 0700   I.V. (mL/kg) 12.3 (0.1)    IV Piggyback 150    Total Intake(mL/kg) 162.3 (1.8)    Urine (mL/kg/hr) 550 (0.3)    Total Output 550     Net -387.7           PHYSICAL EXAMINATION: General:  Appears acutely ill, mechanically ventilated, synchronous Neuro:  rass -2, localizes HEENT:  PERRL, OETT / OGT Cardiovascular:  RRR, no m/r/g Lungs: roncchi, redcuced bases Abdomen:  Soft, nontender, bowel sounds diminished Musculoskeletal:   Moves all extremities, no edema Skin: Multiple ecchymoses  LABS:  Recent Labs Lab 02/03/2013 0734 01/23/2013 1721 02/02/2013 2350 01/25/13 0301 01/25/13 0400  HGB 12.5*  --   --  11.0*  --   WBC 18.0*  --   --  12.8*  --   PLT 116*  --   --  88*  --   NA 140  --   --  144  --   K 4.9  --   --  4.9  --   CL 97  --   --  98  --   CO2 32  --   --  36*  --   GLUCOSE 217*  --   --  177*  --   BUN 58*  --   --  64*  --   CREATININE 1.57*  --   --  1.72*  --   CALCIUM 9.3  --   --  8.8  --   AST 50*  --   --  70*  --   ALT 52  --   --  56*  --   ALKPHOS 153*  --   --  171*  --   BILITOT 0.6  --   --  0.7  --   PROT 7.0  --   --  6.3  --   ALBUMIN 3.0*  --   --  2.7*  --   APTT  --   --   --  34  --   INR  --   --   --  1.38  --   LATICACIDVEN 4.4*  --   --   --   --   TROPONINI <0.30 <0.30 <0.30 <0.30  --   PROCALCITON  --   --   --  0.34  --   PROBNP 8959.0*  --   --   --   --   PHART  --   --   --   --  7.341*  PCO2ART  --   --   --   --  64.0*  PO2ART  --   --   --   --  59.7*    Recent Labs Lab 01/16/2013 0706 01/25/13 0307  GLUCAP 227* 176*   CXR:  2/18 >>> ETT in place, rt base infiltrate?  ASSESSMENT / PLAN:  PULMONARY A:  Acute on chronic respiratory failure.  COPD with exacerbation.  HCAP vs aspiration pneumonia. Cor pulmonale by recent echo (PAP 39). P:   abg reviewed, keep same MV, avoid alk in copd pcx rin am to re assess rt base Remain son low O2 needs, sbt planned Combivent Solu-Medrol to remain  CARDIOVASCULAR A: Transient hypotension post intubation / sedation.  CHF by record, but recent echo demonstrated normal systolic function and no comment on diastolic dysfunction.  P:  Goal MAP>60 Hold Metoprolol, borderline MAP, may need line, may need further volume Continue ASA, Lipitor Trend troponin - neg, dc further  RENAL A:  Acute on chronic renal failure. P:   Trend BMP D/c Lasix Bolus Follow ctr trend  GASTROINTESTINAL A:  Possible  dysphagia. P:   Start TF Protonix for GI Px  HEMATOLOGIC A:  No active issues. P:  Trend CBC in am  Lovenox for DVT Px - dc , clearance of concern Add sub q hep  INFECTIOUS A:  HCAP vs aspiration pneumonia. P:   Cultures and antibiotics as above Continue current nosocomial ABX, however was on linazolid?? Will investigate  But transition off vanc to linazolid  ENDOCRINE  A:   DM2.  Hyperglycemia. P:   ICU Glycemic Control Protocol Phas 1 PCT  NEUROLOGIC A:  Acute encephalopathy. P:   Goal RASS 0 to -1 Fentanyl  Dc all benzo, not a hom emed, high risk delirium  TODAY'S SUMMARY: 15 with COPD, s/p lobectomy admitted for HCAP / aspiration pneumonia.  Intubated after respiratory Tonight: mchanical support.  Bronchodilators / steroids.  Change to linazolid, start TF , sbt ok today, bolus.  I have personally obtained a history, examined the patient, evaluated laboratory and imaging results, formulated the assessment and plan and placed orders.  CRITICAL CARE:  The patient is critically ill with multiple organ systems failure and requires high complexity decision making for assessment and support, frequent evaluation and titration of therapies, application of advanced monitoring technologies and extensive interpretation of multiple databases. Critical Care Time devoted to patient care services described in this note is 30 minutes.   Nelda Bucks., MD  Pulmonary and Critical Care Medicine [facility] Pager: [phone]  01/25/2013, 7:20 AM

## 2013-01-25 NOTE — Progress Notes (Signed)
79yo male already begun on IV ABX now s/p respiratory arrest, to add Levaquin to regimen.  Will begin Levaquin 750mg  IV Q48H for CrCl ~35 ml/min.  Vernard Gambles, PharmD, BCPS 01/25/2013 3:32 AM

## 2013-01-25 NOTE — Care Management Note (Signed)
    Page 1 of 1   01/25/2013     8:42:04 AM   CARE MANAGEMENT NOTE 01/25/2013  Patient:  Garrett Reese, Garrett Reese   Account Number:  [id number]  Date Initiated:  01/25/2013  Documentation initiated by:  Junius Creamer  Subjective/Objective Assessment:   adm w resp distress, on vent     Action/Plan:   from nsg facility   Anticipated DC Date:     Anticipated DC Plan:    In-house referral  Clinical Social Worker      DC Planning Services  CM consult      Choice offered to / List presented to:             Status of service:   Medicare Important Message given?   (If response is "NO", the following Medicare IM given date fields will be blank) Date Medicare IM given:   Date Additional Medicare IM given:    Discharge Disposition:    Per UR Regulation:  Reviewed for med. necessity/level of care/duration of stay  If discussed at Long Length of Stay Meetings, dates discussed:    Comments:  2/18 0840 debbie Elidia Bonenfant rn,bsn

## 2013-01-25 NOTE — Significant Event (Signed)
Rapid Response Event Note  Overview: Time Called: 0235 Arrival Time: 0237 Event Type: Respiratory  Initial Focused Assessment: Called by bedside RN regarding respiratory arrest, advised bedside RN to call Code Blue if patient was not breathing. Upon arrival to room patient on NRB 100% sats 60% and code cart in the room.   Interventions: Code blue team arrived, patient bagged and intubated by CRNA/RT. Patient never lost pulse, afib on Zoll, PIV obtained by IV team.   Event Summary: Name of Physician Notified: [facility] at 0240  Name of Consulting Physician Notified: Dr. Herma Carson at 0250  Outcome: Transferred (Comment);Coded and survived 04/20/2103)  Event End Time: 0310  Lenord Carbo

## 2013-01-26 ENCOUNTER — Encounter: Payer: Self-pay | Admitting: Vascular Surgery

## 2013-01-26 ENCOUNTER — Inpatient Hospital Stay (HOSPITAL_COMMUNITY): Payer: Medicare Other

## 2013-01-26 LAB — GLUCOSE, CAPILLARY
Glucose-Capillary: 183 mg/dL — ABNORMAL HIGH (ref 70–99)
Glucose-Capillary: 219 mg/dL — ABNORMAL HIGH (ref 70–99)

## 2013-01-26 LAB — SODIUM, URINE, RANDOM: Sodium, Ur: <10 mEq/L

## 2013-01-26 LAB — POCT I-STAT 3, ART BLOOD GAS (G3+)
Bicarbonate: 35.3 mEq/L — ABNORMAL HIGH (ref 20.0–24.0)
pCO2 arterial: 69.1 mmHg (ref 35.0–45.0)
pO2, Arterial: 87 mmHg (ref 80.0–100.0)

## 2013-01-26 LAB — BASIC METABOLIC PANEL
Chloride: 100 mEq/L (ref 96–112)
GFR calc Af Amer: 32 mL/min — ABNORMAL LOW (ref >90)
Potassium: 4.2 mEq/L (ref 3.5–5.1)
Sodium: 143 mEq/L (ref 135–145)

## 2013-01-26 LAB — UREA NITROGEN, URINE: Urea Nitrogen, Ur: 728 mg/dL

## 2013-01-26 LAB — URINE CULTURE

## 2013-01-26 LAB — OSMOLALITY, URINE: Osmolality, Ur: 419 mOsm/kg (ref 390–1090)

## 2013-01-26 MED ORDER — METOPROLOL TARTRATE 50 MG PO TABS
50.0000 mg | ORAL_TABLET | Freq: Two times a day (BID) | ORAL | Status: DC
  Administered 2013-01-26 – 2013-01-27 (×2): 50 mg via ORAL
  Filled 2013-01-26 (×6): qty 1

## 2013-01-26 MED ORDER — MIDAZOLAM HCL 2 MG/2ML IJ SOLN
INTRAMUSCULAR | Status: AC
  Administered 2013-01-26: 2 mg via INTRAVENOUS
  Filled 2013-01-26: qty 4

## 2013-01-26 MED ORDER — MIDAZOLAM HCL 2 MG/2ML IJ SOLN
2.0000 mg | Freq: Once | INTRAMUSCULAR | Status: AC
  Administered 2013-01-26: 2 mg via INTRAVENOUS

## 2013-01-26 MED ORDER — SODIUM CHLORIDE 0.9 % IV SOLN
INTRAVENOUS | Status: DC
  Administered 2013-01-27: via INTRAVENOUS

## 2013-01-26 NOTE — Plan of Care (Signed)
Problem: ICU Phase Progression Outcomes Goal: O2 sats trending toward baseline Outcome: Not Progressing Pt remains vent dependent

## 2013-01-26 NOTE — Consult Note (Signed)
PULMONARY  / CRITICAL CARE MEDICINE  Name: Garrett Reese MRN: [id number] DOB: [birth year]    ADMISSION DATE:  02/03/2013 CONSULTATION DATE:  01/25/2013  REFERRING MD :  TRH  CHIEF COMPLAINT:  Acute respiratory failure  BRIEF PATIENT DESCRIPTION:  79 yo with COPD, lung CA s/p lobectomy? And recent hospitalization admitted with HCAP who developed respiratory arrest on the floor, intubated by anesthesia and brought to ICU for further management.  SIGNIFICANT EVENTS / STUDIES:  2/17  Admitted with CAP 2/18  Respiratory arrest, received CPR at [facility],  intubated, brought to ICU  LINES / TUBES: Foley 2/17 >>> OETT 2/18 >>> OGT 2/18 >>>  CULTURES: 2/18  Blood >>> 2/18  Respiratory >>>  ANTIBIOTICS: Zosyn 2/17 >>> vanc 2/17 >>>2/18 Linazolid 2/18>>> Levaquin 2/18 >>>2/19  INTERVAL HISTORY: Vent, comfortable  VITAL SIGNS: Temp:  [96.9 F (36.1 C)-98.9 F (37.2 C)] 98.6 F (37 C) (02/19 0753) Pulse Rate:  [47-108] 63 (02/19 0900) Resp:  [11-24] 15 (02/19 0900) BP: (95-132)/(46-88) 119/52 mmHg (02/19 0900) SpO2:  [90 %-100 %] 95 % (02/19 0900) FiO2 (%):  [40 %] 40 % (02/19 0900) Weight:  [96 kg (211 lb 10.3 oz)] 96 kg (211 lb 10.3 oz) (02/19 0500) HEMODYNAMICS:   VENTILATOR SETTINGS: Vent Mode:  [-] PRVC FiO2 (%):  [40 %] 40 % Set Rate:  [14 bmp] 14 bmp Vt Set:  [500 mL] 500 mL PEEP:  [5 cmH20] 5 cmH20 Pressure Support:  [8 cmH20] 8 cmH20 Plateau Pressure:  [19 cmH20] 19 cmH20 INTAKE / OUTPUT: Intake/Output     02/18 0701 - 02/19 0700 02/19 0701 - 02/20 0700   I.V. (mL/kg) 1285.4 (13.4)    NG/GT 360 60   IV Piggyback 700    Total Intake(mL/kg) 2345.4 (24.4) 60 (0.6)   Urine (mL/kg/hr) 565 (0.2) 275 (1)   Total Output 565 275   Net +1780.4 -215         PHYSICAL EXAMINATION: General:  Appears acutely ill, mechanically ventilated Neuro:  rass score -2 to 4, moves arms legs equal HEENT:  PERRL Cardiovascular:  RRR, no m/r/g Lungs: roncchi bases Abdomen:  Soft,  nontender, bowel sounds diminished Musculoskeletal:  Moves all extremities, no edema Skin: Multiple ecchymoses, poor access  LABS:  Recent Labs Lab 01/22/2013 0734  01/18/2013 2350 01/25/13 0301 01/25/13 0400 01/25/13 0825 01/26/13 0325 01/26/13 0415  HGB 12.5*  --   --  11.0*  --   --   --  9.4*  WBC 18.0*  --   --  12.8*  --   --   --  9.5  PLT 116*  --   --  88*  --   --   --  61*  NA 140  --   --  144  --   --   --  143  K 4.9  --   --  4.9  --   --   --  4.2  CL 97  --   --  98  --   --   --  100  CO2 32  --   --  36*  --   --   --  33*  GLUCOSE 217*  --   --  177*  --   --   --  178*  BUN 58*  --   --  64*  --   --   --  86*  CREATININE 1.57*  --   --  1.72*  --   --   --  2.04*  CALCIUM 9.3  --   --  8.8  --   --   --  8.5  AST 50*  --   --  70*  --   --   --   --   ALT 52  --   --  56*  --   --   --   --   ALKPHOS 153*  --   --  171*  --   --   --   --   BILITOT 0.6  --   --  0.7  --   --   --   --   PROT 7.0  --   --  6.3  --   --   --   --   ALBUMIN 3.0*  --   --  2.7*  --   --   --   --   APTT  --   --   --  34  --   --   --   --   INR  --   --   --  1.38  --   --   --   --   LATICACIDVEN 4.4*  --   --   --   --   --   --   --   TROPONINI <0.30  < > <0.30 <0.30  --  <0.30  --   --   PROCALCITON  --   --   --  0.34  --   --   --  0.40  PROBNP 8959.0*  --   --   --   --   --   --   --   PHART  --   --   --   --  7.341*  --  7.317*  --   PCO2ART  --   --   --   --  64.0*  --  69.1*  --   PO2ART  --   --   --   --  59.7*  --  87.0  --   < > = values in this interval not displayed.  Recent Labs Lab 01/25/13 1605 01/25/13 2002 01/25/13 2353 01/26/13 0353 01/26/13 0715  GLUCAP 200* 209* 219* 183* 135*   CXR:  2/19 >>> ETT in place, loss lung vol rt, prior lobectomy, LLL hazziness increased  ASSESSMENT / PLAN:  PULMONARY A:  Acute on chronic respiratory failure.  COPD with exacerbation.  HCAP vs aspiration pneumonia. Cor pulmonale by recent echo (PAP 39). P:    Wean cpap 5 ps5, goa 1 hr, need to again clarify DNI with planned extubation Limit sedation further for SBT Repeat pcxr in am for LLL hazziness  CARDIOVASCULAR A: Transient hypotension post intubation / sedation.  CHF by record, but recent echo demonstrated normal systolic function and no comment on diastolic dysfunction.  \rate controlled fib P:  Goal MAP>60 Restart home metoprolol Continue ASA, Lipitor  RENAL A:  Acute on chronic renal failure, worsening P:   Place cvp, and assess Dc lasix NS at 50 cc/hr Urine na, osm  GASTROINTESTINAL A:  Possible dysphagia. P:   Tolerating TF Protonix for GI Px BM noted  HEMATOLOGIC A: Hematuria, mild blood tinged secretions, drop in all cell lineages P:  Trend CBC in am  Dc sub q hep, with plat trend Recent hosp exposure, likely hep exposure, HITT assay scd  INFECTIOUS A:  HCAP vs aspiration pneumonia. P:   Cultures and antibiotics as above Continue  current nosocomial ABX linazolid maintain  ENDOCRINE  A:   DM2.  Hyperglycemia. P:   ICU Glycemic Control Protocol Phas 1 PCT  NEUROLOGIC A:  Acute encephalopathy. P:   Goal RASS 0  Fentanyl   Dc all benzo, not a hom emed, high risk delirium  TODAY'S SUMMARY: 73 with COPD, s/p lobectomy admitted for HCAP / aspiration pneumonia.  Wean attempts, place line, assess cvp, arf worsening., pancytopenia?   I have personally obtained a history, examined the patient, evaluated laboratory and imaging results, formulated the assessment and plan and placed orders.  CRITICAL CARE:  The patient is critically ill with multiple organ systems failure and requires high complexity decision making for assessment and support, frequent evaluation and titration of therapies, application of advanced monitoring technologies and extensive interpretation of multiple databases. Critical Care Time devoted to patient care services described in this note is 30 minutes.   Nelda Bucks.,  MD  Pulmonary and Critical Care Medicine [facility] Pager: [phone]  01/26/2013, 9:51 AM

## 2013-01-26 NOTE — Clinical Social Work Psychosocial (Signed)
     Clinical Social Work Department BRIEF PSYCHOSOCIAL ASSESSMENT 01/26/2013  Patient:  Garrett Reese, Garrett Reese     Account Number:  [id number]     Admit date:  01/23/2013  Clinical Social Worker:  Margaree Mackintosh  Date/Time:  01/26/2013 10:13 AM  Referred by:  Physician  Date Referred:  01/26/2013 Referred for  SNF Placement   Other Referral:   Pt admitted from [facility].   Interview type:  Family Other interview type:   Pt currently intubated.    PSYCHOSOCIAL DATA Living Status:  FACILITY Admitted from facility:  [facility] Level of care:  Skilled Nursing Facility Primary support name:  Mary Demartini: [phone] Primary support relationship to patient:  CHILD, ADULT Degree of support available:   Adequate.    CURRENT CONCERNS Current Concerns  Post-Acute Placement   Other Concerns:    SOCIAL WORK ASSESSMENT / PLAN Clinical Social Worker recieved referral indicating pt is from [facility].  CSW reviewed chart and spoke with pt's dtr; pt currently intubated and unable to fully participate in assessment.  CSW introduced self, explained role, and provided support.  Dtr confirmed pt is from [facility].  Dtr expressed interest in completing a bed search [facility] to provide pt an opportunity to be within closer proximity to [facility].  CSW reviewed SNF process.  Dtr stated understanding.  CSW to continue to follow and assist as needed.   Assessment/plan status:  Information/Referral to [facility] Other assessment/ plan:   Information/referral to community resources:   SNF.    PATIENTS/FAMILYS RESPONSE TO PLAN OF CARE: Dtr was pleasant and engaged in conversation. Dtr thanked CSW for intervention.

## 2013-01-26 NOTE — Progress Notes (Addendum)
Pt noted to have 27 beats of non sustained VT with spontaneous return to rate controlled atrial fibrillation.  Pt's BP decreased at 80-90's systolic.  CXR for CL placement pending.  Case discussed with Elink who will relay information to Dr. Delton Coombes.  Will continue to closely monitor.  [place], [facility]

## 2013-01-26 NOTE — Procedures (Signed)
Central Venous Catheter Insertion Procedure Note Garrett Reese [id number] Mar 29, 1921  Procedure: Insertion of Central Venous Catheter Indications: Assessment of intravascular volume, Drug and/or fluid administration and Frequent blood sampling  Procedure Details Consent: Risks of procedure as well as the alternatives and risks of each were explained to the (patient/caregiver).  Consent for procedure obtained. Time Out: Verified patient identification, verified procedure, site/side was marked, verified correct patient position, special equipment/implants available, medications/allergies/relevent history reviewed, required imaging and test results available.  Performed  Maximum sterile technique was used including antiseptics, cap, gloves, gown, hand hygiene, mask and sheet. Skin prep: Chlorhexidine; local anesthetic administered A antimicrobial bonded/coated triple lumen catheter was placed in the right internal jugular vein using the Seldinger technique.  Evaluation Blood flow good Complications: No apparent complications Patient did tolerate procedure well. Chest X-ray ordered to verify placement.  CXR: pending.  Ultrasound was used to direct cannulation and confirm placement of line.  RITCH,ERIK 01/26/2013, 1:29 PM  Tolerated well  Mcarthur Rossetti. Tyson Alias, MD, FACP Pgr: [phone] [place] [facility]

## 2013-01-27 ENCOUNTER — Inpatient Hospital Stay (HOSPITAL_COMMUNITY): Payer: Medicare Other

## 2013-01-27 DIAGNOSIS — I469 Cardiac arrest, cause unspecified: Secondary | ICD-10-CM

## 2013-01-27 LAB — POCT I-STAT 3, ART BLOOD GAS (G3+)
Acid-Base Excess: 8 mmol/L — ABNORMAL HIGH (ref 0.0–2.0)
Bicarbonate: 25.7 mEq/L — ABNORMAL HIGH (ref 20.0–24.0)
Patient temperature: 98.6
pCO2 arterial: 57 mmHg — ABNORMAL HIGH (ref 35.0–45.0)
pCO2 arterial: 74 mmHg (ref 35.0–45.0)
pH, Arterial: 7.262 — ABNORMAL LOW (ref 7.350–7.450)
pO2, Arterial: 109 mmHg — ABNORMAL HIGH (ref 80.0–100.0)
pO2, Arterial: 317 mmHg — ABNORMAL HIGH (ref 80.0–100.0)

## 2013-01-27 LAB — BASIC METABOLIC PANEL
BUN: 113 mg/dL — ABNORMAL HIGH (ref 6–23)
BUN: 117 mg/dL — ABNORMAL HIGH (ref 6–23)
CO2: 23 mEq/L (ref 19–32)
Calcium: 8.2 mg/dL — ABNORMAL LOW (ref 8.4–10.5)
Chloride: 97 mEq/L (ref 96–112)
Chloride: 95 mEq/L — ABNORMAL LOW (ref 96–112)
Creatinine, Ser: 2.73 mg/dL — ABNORMAL HIGH (ref 0.50–1.35)
Creatinine, Ser: 2.76 mg/dL — ABNORMAL HIGH (ref 0.50–1.35)
GFR calc Af Amer: 23 mL/min — ABNORMAL LOW (ref >90)
GFR calc Af Amer: 22 mL/min — ABNORMAL LOW (ref >90)
GFR calc non Af Amer: 20 mL/min — ABNORMAL LOW (ref >90)
GFR calc non Af Amer: 19 mL/min — ABNORMAL LOW (ref >90)
Glucose, Bld: 232 mg/dL — ABNORMAL HIGH (ref 70–99)
Glucose, Bld: 300 mg/dL — ABNORMAL HIGH (ref 70–99)
Potassium: 5.4 mEq/L — ABNORMAL HIGH (ref 3.5–5.1)
Potassium: 5 mEq/L (ref 3.5–5.1)
Sodium: 142 mEq/L (ref 135–145)

## 2013-01-27 LAB — GLUCOSE, CAPILLARY
Glucose-Capillary: 196 mg/dL — ABNORMAL HIGH (ref 70–99)
Glucose-Capillary: 165 mg/dL — ABNORMAL HIGH (ref 70–99)

## 2013-01-27 LAB — CBC WITH DIFFERENTIAL/PLATELET
Basophils Absolute: 0 10*3/uL (ref 0.0–0.1)
Basophils Relative: 0 % (ref 0–1)
Hemoglobin: 9.4 g/dL — ABNORMAL LOW (ref 13.0–17.0)
MCHC: 31.9 g/dL (ref 30.0–36.0)
Neutro Abs: 9 10*3/uL — ABNORMAL HIGH (ref 1.7–7.7)
Neutrophils Relative %: 94 % — ABNORMAL HIGH (ref 43–77)
Platelets: 61 10*3/uL — ABNORMAL LOW (ref 150–400)
RDW: 15.5 % (ref 11.5–15.5)

## 2013-01-27 LAB — BLOOD GAS, ARTERIAL
Acid-Base Excess: 2.2 mmol/L — ABNORMAL HIGH (ref 0.0–2.0)
Bicarbonate: 30.1 mEq/L — ABNORMAL HIGH (ref 20.0–24.0)
FIO2: 0.4 %
O2 Saturation: 86.8 %
Patient temperature: 98.6
TCO2: 32.8 mmol/L (ref 0–100)
pO2, Arterial: 61.9 mmHg — ABNORMAL LOW (ref 80.0–100.0)

## 2013-01-27 LAB — CBC
Hemoglobin: 8.8 g/dL — ABNORMAL LOW (ref 13.0–17.0)
Hemoglobin: 10.2 g/dL — ABNORMAL LOW (ref 13.0–17.0)
MCH: 29.5 pg (ref 26.0–34.0)
MCHC: 31.4 g/dL (ref 30.0–36.0)
MCV: 94 fL (ref 78.0–100.0)
Platelets: 48 10*3/uL — ABNORMAL LOW (ref 150–400)
Platelets: 52 10*3/uL — ABNORMAL LOW (ref 150–400)
RBC: 2.98 MIL/uL — ABNORMAL LOW (ref 4.22–5.81)
WBC: 12.6 10*3/uL — ABNORMAL HIGH (ref 4.0–10.5)

## 2013-01-27 LAB — LACTIC ACID, PLASMA: Lactic Acid, Venous: 6.1 mmol/L — ABNORMAL HIGH (ref 0.5–2.2)

## 2013-01-27 LAB — MAGNESIUM: Magnesium: 2.8 mg/dL — ABNORMAL HIGH (ref 1.5–2.5)

## 2013-01-27 LAB — COMPREHENSIVE METABOLIC PANEL
ALT: 114 U/L — ABNORMAL HIGH (ref 0–53)
AST: 97 U/L — ABNORMAL HIGH (ref 0–37)
CO2: 34 mEq/L — ABNORMAL HIGH (ref 19–32)
Calcium: 8.2 mg/dL — ABNORMAL LOW (ref 8.4–10.5)
Chloride: 100 mEq/L (ref 96–112)
Creatinine, Ser: 2.33 mg/dL — ABNORMAL HIGH (ref 0.50–1.35)
GFR calc Af Amer: 27 mL/min — ABNORMAL LOW (ref >90)
GFR calc non Af Amer: 23 mL/min — ABNORMAL LOW (ref >90)
Glucose, Bld: 193 mg/dL — ABNORMAL HIGH (ref 70–99)
Sodium: 144 mEq/L (ref 135–145)
Total Bilirubin: 0.4 mg/dL (ref 0.3–1.2)

## 2013-01-27 LAB — PHOSPHORUS: Phosphorus: 6.8 mg/dL — ABNORMAL HIGH (ref 2.3–4.6)

## 2013-01-27 MED ORDER — MAGNESIUM SULFATE 50 % IJ SOLN
2.0000 g | INTRAMUSCULAR | Status: AC
  Administered 2013-01-27: 2 g via INTRAVENOUS

## 2013-01-27 MED ORDER — AMIODARONE HCL IN DEXTROSE 360-4.14 MG/200ML-% IV SOLN
0.5000 mg/min | INTRAVENOUS | Status: DC
  Administered 2013-01-28 (×2): 0.5 mg/min via INTRAVENOUS
  Filled 2013-01-27 (×2): qty 200

## 2013-01-27 MED ORDER — PHENYLEPHRINE HCL 10 MG/ML IJ SOLN
30.0000 ug/min | INTRAVENOUS | Status: DC
  Administered 2013-01-27: 175 ug/min via INTRAVENOUS
  Administered 2013-01-27: 40 ug/min via INTRAVENOUS
  Administered 2013-01-27: 125.333 ug/min via INTRAVENOUS
  Filled 2013-01-27 (×3): qty 1

## 2013-01-27 MED ORDER — DEXTROSE 5 % IV SOLN
2.0000 ug/min | INTRAVENOUS | Status: DC
  Administered 2013-01-27: 20 ug/min via INTRAVENOUS
  Filled 2013-01-27: qty 16

## 2013-01-27 MED ORDER — FLUCONAZOLE IN SODIUM CHLORIDE 200-0.9 MG/100ML-% IV SOLN
200.0000 mg | INTRAVENOUS | Status: DC
  Administered 2013-01-27: 200 mg via INTRAVENOUS
  Filled 2013-01-27 (×2): qty 100

## 2013-01-27 MED ORDER — SODIUM POLYSTYRENE SULFONATE 15 GM/60ML PO SUSP
30.0000 g | Freq: Once | ORAL | Status: AC
  Administered 2013-01-27: 30 g
  Filled 2013-01-27: qty 120

## 2013-01-27 MED ORDER — PHENYLEPHRINE HCL 10 MG/ML IJ SOLN
30.0000 ug/min | INTRAVENOUS | Status: DC
  Administered 2013-01-27: 30 ug/min via INTRAVENOUS
  Filled 2013-01-27 (×2): qty 1

## 2013-01-27 MED ORDER — DEXTROSE 5 % IV SOLN
30.0000 ug/min | INTRAVENOUS | Status: DC
  Administered 2013-01-27: 125 ug/min via INTRAVENOUS
  Administered 2013-01-28: 200 ug/min via INTRAVENOUS
  Administered 2013-01-28: 160 ug/min via INTRAVENOUS
  Administered 2013-01-28: 200 ug/min via INTRAVENOUS
  Filled 2013-01-27 (×4): qty 4

## 2013-01-27 MED ORDER — AMIODARONE HCL IN DEXTROSE 360-4.14 MG/200ML-% IV SOLN
1.0000 mg/min | INTRAVENOUS | Status: AC
  Administered 2013-01-27: 1 mg/min via INTRAVENOUS
  Filled 2013-01-27 (×2): qty 200

## 2013-01-27 MED ORDER — AMIODARONE HCL IN DEXTROSE 360-4.14 MG/200ML-% IV SOLN
INTRAVENOUS | Status: AC
  Administered 2013-01-27: 200 mL via INTRAVENOUS
  Filled 2013-01-27: qty 200

## 2013-01-27 MED ORDER — AMIODARONE LOAD VIA INFUSION
150.0000 mg | Freq: Once | INTRAVENOUS | Status: AC
  Administered 2013-01-27: 150 mg via INTRAVENOUS
  Filled 2013-01-27: qty 83.34

## 2013-01-27 MED ORDER — FUROSEMIDE 10 MG/ML IJ SOLN: 40.0000 mg | Freq: Two times a day (BID) | INTRAMUSCULAR | Status: DC

## 2013-01-27 MED FILL — Medication: Qty: 1 | Status: AC

## 2013-01-27 NOTE — Progress Notes (Signed)
Code Blue Response, [employer]  Arrived to code blue with Dr. Tyson Alias and CCM NP running code. No assistance needed.  Marikay Alar, MD PGY1 FMTS

## 2013-01-27 NOTE — Progress Notes (Signed)
Reported Elevated troponin, .6, to Dr. Vassie Loll. Will continue to monitor.

## 2013-01-27 NOTE — Progress Notes (Signed)
I have had extensive discussions with family daughter med Rhona Leavens We discussed patients current circumstances and organ failures. We also discussed patient's prior wishes under circumstances such as this. Family has decided to NOT perform resuscitation if arrest but to continue current medical support for now.  Mcarthur Rossetti. Tyson Alias, MD, FACP Pgr: [phone] [place] [facility]

## 2013-01-27 NOTE — Progress Notes (Signed)
Inpatient Diabetes Program Recommendations  AACE/ADA: New Consensus Statement on Inpatient Glycemic Control (2013)  Target Ranges:  Prepandial:   less than 140 mg/dL      Peak postprandial:   less than 180 mg/dL (1-2 hours)      Critically ill patients:  140 - 180 mg/dL   Results for ROWLAND, ERICSSON (MRN [id number]) as of 01/27/2013 08:45  Ref. Range 01/25/2013 23:53 01/26/2013 03:53 01/26/2013 07:15 01/26/2013 11:16 01/26/2013 15:12 01/26/2013 19:35 01/26/2013 23:48 01/27/2013 04:22  Glucose-Capillary Latest Range: 70-99 mg/dL 213 (H) 086 (H) 578 (H) 172 (H) 187 (H) 193 (H) 235 (H) 196 (H)    Inpatient Diabetes Program Recommendations Correction (SSI): Please consider increasing Novolog correction to Resistant scale. HgbA1C: Please consider ordering an A1C to determine glycemic control over the last 2-3 months.  Note: Blood glucose has ranged from 135-235 mg/dl over the past 24 hours and patient is on the ICU Hyperglycemic protocol for inpatient glycemic control.  Please consider increasing the Novolog correction to the resistant scale since patient is on steroids.  Also, please consider ordering an A1C to determine glycemic control over the last 2-3 months.  There are not any values for previous A1C results in the chart.  Will continue to follow.  Thanks, Orlando Penner, RN, BSN, CCRN Diabetes Coordinator Inpatient Diabetes Program [phone]

## 2013-01-27 NOTE — Progress Notes (Signed)
PULMONARY  / CRITICAL CARE MEDICINE  Name: Garrett Reese MRN: [id number] DOB: [birth year]    ADMISSION DATE:  02/17/13  BRIEF PATIENT DESCRIPTION: 79 yo with COPD, lung CA s/p lobectomy? And recent hospitalization admitted with HCAP who developed respiratory arrest on the floor, intubated by anesthesia and brought to ICU for further management.   SIGNIFICANT EVENTS / STUDIES:  2/17 Admitted with CAP  2/18 Respiratory arrest, received CPR at [facility], intubated, brought to ICU   LINES / TUBES:  Foley 2/17 >>>  OETT 2/18 >>>  OGT 2/18 >>>   CULTURES:  2/18 Blood >>> Negative 2/18 Respiratory >>> not collected yet 2/18 Urine>>> 100 000 Yeast  ANTIBIOTICS:  Zosyn 2/17 >>> vanc 2/17 >>>2/18  Linazolid 2/18>>>2/20  Levaquin 2/18 >>>2/19 Diflucan 2/20>>>  SUBJECTIVE: weaning well, cvp 15  VITAL SIGNS: Temp:  [98.1 F (36.7 C)-98.9 F (37.2 C)] 98.4 F (36.9 C) (02/20 0751) Pulse Rate:  [63-105] 87 (02/20 0600) Resp:  [13-21] 15 (02/20 0600) BP: (87-121)/(41-96) 104/44 mmHg (02/20 0600) SpO2:  [95 %-100 %] 100 % (02/20 0600) FiO2 (%):  [40 %] 40 % (02/20 0600) Weight:  [213 lb 13.5 oz (97 kg)] 213 lb 13.5 oz (97 kg) (02/20 0500)  HEMODYNAMICS: CVP:  [10 mmHg-13 mmHg] 11 mmHg  VENTILATOR SETTINGS: Vent Mode:  [-] PRVC FiO2 (%):  [40 %] 40 % Set Rate:  [14 bmp] 14 bmp Vt Set:  [500 mL] 500 mL PEEP:  [5 cmH20] 5 cmH20 Pressure Support:  [5 cmH20] 5 cmH20 Plateau Pressure:  [16 cmH20-26 cmH20] 18 cmH20  INTAKE / OUTPUT: Intake/Output     02/19 0701 - 02/20 0700 02/20 0701 - 02/21 0700   I.V. (mL/kg) 867.5 (8.9)    NG/GT 930    IV Piggyback 750    Total Intake(mL/kg) 2547.5 (26.3)    Urine (mL/kg/hr) 750 (0.3)    Total Output 750     Net +1797.5          Stool Occurrence 3 x      PHYSICAL EXAMINATION:  General: Appears acutely ill, mechanically ventilated  Neuro:  Intubated and sedated HEENT: PERRL  Cardiovascular: RRR, no m/r/g  Lungs: roncchi bases, calm  on wean Abdomen: Soft, nontender, bowel sounds diminished  Musculoskeletal: Moves all extremities, no edema  Skin: Multiple ecchymoses, poor access  LABS:  Recent Labs Lab 02-17-13 0734  Feb 17, 2013 2350 01/25/13 0301 01/25/13 0400 01/25/13 0825 01/26/13 0325 01/26/13 0415 01/27/13 0450  HGB 12.5*  --   --  11.0*  --   --   --  9.4* 8.8*  WBC 18.0*  --   --  12.8*  --   --   --  9.5 12.6*  PLT 116*  --   --  88*  --   --   --  61* 48*  NA 140  --   --  144  --   --   --  143 144  K 4.9  --   --  4.9  --   --   --  4.2 4.1  CL 97  --   --  98  --   --   --  100 100  CO2 32  --   --  36*  --   --   --  33* 34*  GLUCOSE 217*  --   --  177*  --   --   --  178* 193*  BUN 58*  --   --  64*  --   --   --  86* 108*  CREATININE 1.57*  --   --  1.72*  --   --   --  2.04* 2.33*  CALCIUM 9.3  --   --  8.8  --   --   --  8.5 8.2*  AST 50*  --   --  70*  --   --   --   --  97*  ALT 52  --   --  56*  --   --   --   --  114*  ALKPHOS 153*  --   --  171*  --   --   --   --  125*  BILITOT 0.6  --   --  0.7  --   --   --   --  0.4  PROT 7.0  --   --  6.3  --   --   --   --  5.3*  ALBUMIN 3.0*  --   --  2.7*  --   --   --   --  2.1*  APTT  --   --   --  34  --   --   --   --   --   INR  --   --   --  1.38  --   --   --   --   --   LATICACIDVEN 4.4*  --   --   --   --   --   --   --   --   TROPONINI <0.30  < > <0.30 <0.30  --  <0.30  --   --   --   PROCALCITON  --   --   --  0.34  --   --   --  0.40 0.47  PROBNP 8959.0*  --   --   --   --   --   --   --   --   PHART  --   --   --   --  7.341*  --  7.317*  --   --   PCO2ART  --   --   --   --  64.0*  --  69.1*  --   --   PO2ART  --   --   --   --  59.7*  --  87.0  --   --   < > = values in this interval not displayed.  Recent Labs Lab 01/26/13 1116 01/26/13 1512 01/26/13 1935 01/26/13 2348 01/27/13 0422  GLUCAP 172* 187* 193* 235* 196*    CXR: 2/20 1. Small bilateral pleural effusions persist with overlying atelectasis, improved LLL  hazziness  ASSESSMENT / PLAN:  PULMONARY  A: Acute on chronic respiratory failure. COPD with exacerbation. HCAP vs aspiration pneumonia. Cor pulmonale by recent echo (PAP 39).  P:  On wean yesterday. Went to full support after sedation for CVC. This am back to wean, cpap 5 ps 5, goal 1 hr, assess rsbi, abg CXR and clinically improving. Continue Nebs On steroids  Lasix start, cvp 15  CARDIOVASCULAR  A: Transient hypotension post intubation / sedation. CHF by record, but recent echo demonstrated normal systolic function and no comment on diastolic dysfunction.  Rate controlled Afib P:  Couple of runs of V-tac overnight .Rate controlled now Lopressor held last night due to low BP. Goal MAP>60  Continue ASA, Lipitor  lasix  RENAL  A: Acute on chronic renal failure, worsening cvp 15, pos 2 liters again  P:  NS at 50 cc/hr -kvo Urine Na, osm reviewed, but now pos balance 2 liters, low dose lasix start Trend cvp   GASTROINTESTINAL  A: Possible dysphagia.  P:  Tolerating TF -holding weaning Protonix for GI Px  BM noted   HEMATOLOGIC  A: Anemia,Thrombocytopenia >>worsening (pos balance with this) Hematuria, mild blood tinged secretions P:  Trend CBC  Recent hosp exposure, likely hep exposure, HITT assay pending  scd  Lasix, plat to follow  INFECTIOUS  A: HCAP vs aspiration pneumonia.  P:  B culture negative X2, urine culture positive for yeast. Continue on Zosyn, establish stop date total 7 days  ENDOCRINE  A: DM2. Hyperglycemia.  P:  ICU Glycemic Control Protocol Phas 1   NEUROLOGIC  A: Acute encephalopathy.  P:  Goal RASS 0  Fentanyl  Dc all benzo, not a hom emed, high risk delirium   Signed D. [place] Rolene Arbour, MD Family Medicine  PGY-2  01/27/2013, 8:34 AM  Ccm tmie 30 min   I have fully examined this patient and agree with above findings.    And edited in fukll\\Garrett Kemnitz J. Tyson Alias, MD, FACP Pgr: [phone] [place] [facility]

## 2013-01-27 NOTE — Procedures (Signed)
Arterial Catheter Insertion Procedure Note Garrett Reese [id number] [birth year]  Procedure: Insertion of Arterial Catheter  Indications: Blood pressure monitoring and Frequent blood sampling  Procedure Details Consent: Unable to obtain consent because of emergent medical necessity. Time Out: Verified patient identification, verified procedure, site/side was marked, verified correct patient position, special equipment/implants available, medications/allergies/relevent history reviewed, required imaging and test results available.  Performed  Maximum sterile technique was used including antiseptics, cap, gloves, gown, hand hygiene, mask and sheet. Skin prep: Chlorhexidine; local anesthetic administered 20 gauge catheter was inserted into right femoral artery using the Seldinger technique.  Evaluation Blood flow good; BP tracing good. Complications: No apparent complications.   Nelda Bucks 01/27/2013  During code  Mcarthur Rossetti. Tyson Alias, MD, FACP Pgr: [phone] [place] [facility]

## 2013-01-27 NOTE — Progress Notes (Signed)
Clinical Social Worker staffed case with RN.  CSW to continue to follow and assist as needed.   Leslyn Monda, MSW, LCSWA [phone] 

## 2013-01-27 NOTE — Progress Notes (Signed)
Chaplain responded to page from 2100 Director Tana Conch) to support family of pt currently coding. Family enroute from [place]. I met pt's daughter Corrie Dandy and her son upon their arrival and took them to 2100 conference room where Ron gave them brief update on pt. Pt survived code but Dr. Tyson Alias says pt could code again. Dr. Tyson Alias will be coming to talk with family about how to proceed. In the meantime I took family to bedside. When they arrived pt opened his eyes. I left them at bedside talking to pt. (Pt is intubated and can not reply to them.)

## 2013-01-27 NOTE — Progress Notes (Signed)
ANTIBIOTIC CONSULT NOTE - FOLLOW UP  Pharmacy Consult:  Zosyn Indication:  PNA  Allergies  Allergen Reactions  . Captopril     Patient Measurements: Height: 5\' 6"  (167.6 cm) Weight: 213 lb 13.5 oz (97 kg) IBW/kg (Calculated) : 63.8  Vital Signs: Temp: 98.9 F (37.2 C) (02/20 0423) Temp src: Oral (02/20 0423) BP: 104/44 mmHg (02/20 0600) Pulse Rate: 87 (02/20 0600) Intake/Output from previous day: 02/19 0701 - 02/20 0700 In: 2547.5 [I.V.:867.5; NG/GT:930; IV Piggyback:750] Out: 750 [Urine:750]  Labs:  Recent Labs  01/25/13 0301 01/26/13 0415 01/27/13 0450  WBC 12.8* 9.5 12.6*  HGB 11.0* 9.4* 8.8*  PLT 88* 61* 48*  CREATININE 1.72* 2.04* 2.33*   Estimated Creatinine Clearance: 23.4 ml/min (by C-G formula based on Cr of 2.33). No results found for this basename: VANCOTROUGH, Leodis Binet, VANCORANDOM, GENTTROUGH, GENTPEAK, GENTRANDOM, TOBRATROUGH, TOBRAPEAK, TOBRARND, AMIKACINPEAK, AMIKACINTROU, AMIKACIN,  in the last 72 hours   Microbiology: Recent Results (from the past 720 hour(s))  MRSA PCR SCREENING     Status: None   Collection Time    Feb 20, 2013 11:55 PM      Result Value Range Status   MRSA by PCR NEGATIVE  NEGATIVE Final   Comment:            The GeneXpert MRSA Assay (FDA     approved for NASAL specimens     only), is one component of a     comprehensive MRSA colonization     surveillance program. It is not     intended to diagnose MRSA     infection nor to guide or     monitor treatment for     MRSA infections.  CULTURE, BLOOD (ROUTINE X 2)     Status: None   Collection Time    01/25/13  4:25 AM      Result Value Range Status   Specimen Description BLOOD RIGHT ARM   Final   Special Requests BOTTLES DRAWN AEROBIC ONLY 4CC   Final   Culture  Setup Time 01/25/2013 10:03   Final   Culture     Final   Value:        BLOOD CULTURE RECEIVED NO GROWTH TO DATE CULTURE WILL BE HELD FOR 5 DAYS BEFORE ISSUING A FINAL NEGATIVE REPORT   Report Status PENDING    Incomplete  CULTURE, BLOOD (ROUTINE X 2)     Status: None   Collection Time    01/25/13  4:25 AM      Result Value Range Status   Specimen Description BLOOD LEFT ARM   Final   Special Requests BOTTLES DRAWN AEROBIC ONLY 4CC   Final   Culture  Setup Time 01/25/2013 10:03   Final   Culture     Final   Value:        BLOOD CULTURE RECEIVED NO GROWTH TO DATE CULTURE WILL BE HELD FOR 5 DAYS BEFORE ISSUING A FINAL NEGATIVE REPORT   Report Status PENDING   Incomplete  URINE CULTURE     Status: None   Collection Time    01/25/13  2:42 PM      Result Value Range Status   Specimen Description URINE, CATHETERIZED   Final   Special Requests zosyn, zyvox   Final   Culture  Setup Time 01/25/2013 17:44   Final   Colony Count >=100,000 COLONIES/ML   Final   Culture YEAST   Final   Report Status 01/26/2013 FINAL   Final  Assessment: 79 YO nursing home patient recently treated for [facility]CAP at [facility] from 1/29-2/5, re-admitted with respiratory distress.  Currently on Zosyn and Zyvox for HCAP.  Patient's renal function is worsening.  Zosyn 2/17 >> LVQ 2/18 >> 2/19 Zyvox 2/18 (was on PTA) >>  2/18 blood cx - pending 2/18 urine cx - yeast   Goal of Therapy:  Clearance of infection   Plan:  - Zosyn 3.375gm IV q8h (4 hour infusion) - Zyvox 600mg  IV Q12H as ordered - F/U renal function, culture data, and clinical progress - f/u HIT panel    Bob Daversa D. Laney Potash, PharmD, BCPS Pager:  [phone] 01/27/2013, 7:15 AM

## 2013-01-27 NOTE — Procedures (Signed)
Coded [facility] followed including empiric treatment hyperK A line placed Trop ecg Dc levo, use neo  amio for VT arrest with pulse  Mcarthur Rossetti. Tyson Alias, MD, FACP Pgr: [phone] [place] [facility]

## 2013-01-28 ENCOUNTER — Ambulatory Visit: Payer: Medicare Other | Admitting: Vascular Surgery

## 2013-01-28 LAB — BASIC METABOLIC PANEL
BUN: 124 mg/dL — ABNORMAL HIGH (ref 6–23)
BUN: 128 mg/dL — ABNORMAL HIGH (ref 6–23)
CO2: 24 mEq/L (ref 19–32)
CO2: 26 mEq/L (ref 19–32)
Calcium: 8.3 mg/dL — ABNORMAL LOW (ref 8.4–10.5)
Calcium: 8.2 mg/dL — ABNORMAL LOW (ref 8.4–10.5)
Chloride: 93 mEq/L — ABNORMAL LOW (ref 96–112)
Creatinine, Ser: 3.02 mg/dL — ABNORMAL HIGH (ref 0.50–1.35)
Creatinine, Ser: 3.05 mg/dL — ABNORMAL HIGH (ref 0.50–1.35)
GFR calc Af Amer: 19 mL/min — ABNORMAL LOW (ref >90)
GFR calc non Af Amer: 17 mL/min — ABNORMAL LOW (ref >90)
GFR calc non Af Amer: 17 mL/min — ABNORMAL LOW (ref >90)
GFR calc non Af Amer: 15 mL/min — ABNORMAL LOW (ref >90)
Glucose, Bld: 266 mg/dL — ABNORMAL HIGH (ref 70–99)
Glucose, Bld: 238 mg/dL — ABNORMAL HIGH (ref 70–99)
Glucose, Bld: 193 mg/dL — ABNORMAL HIGH (ref 70–99)
Potassium: 4.2 mEq/L (ref 3.5–5.1)
Sodium: 138 mEq/L (ref 135–145)

## 2013-01-28 LAB — GLUCOSE, CAPILLARY
Glucose-Capillary: 223 mg/dL — ABNORMAL HIGH (ref 70–99)
Glucose-Capillary: 217 mg/dL — ABNORMAL HIGH (ref 70–99)

## 2013-01-28 LAB — HEPARIN INDUCED THROMBOCYTOPENIA PNL: UFH High Dose UFH H: 0 % Release

## 2013-01-28 MED ORDER — MORPHINE SULFATE 25 MG/ML IV SOLN
10.0000 mg/h | INTRAVENOUS | Status: DC
  Administered 2013-01-28: 10 mg/h via INTRAVENOUS
  Filled 2013-01-28: qty 10

## 2013-01-28 MED ORDER — MORPHINE BOLUS VIA INFUSION
5.0000 mg | INTRAVENOUS | Status: DC | PRN
  Filled 2013-01-28: qty 20

## 2013-01-28 MED ORDER — PIPERACILLIN-TAZOBACTAM IN DEX 2-0.25 GM/50ML IV SOLN
2.2500 g | Freq: Four times a day (QID) | INTRAVENOUS | Status: DC
  Filled 2013-01-28 (×4): qty 50

## 2013-01-28 MED ORDER — INSULIN ASPART 100 UNIT/ML ~~LOC~~ SOLN
0.0000 [IU] | SUBCUTANEOUS | Status: DC
  Administered 2013-01-28 (×2): 7 [IU] via SUBCUTANEOUS

## 2013-01-31 LAB — CULTURE, BLOOD (ROUTINE X 2): Culture: NO GROWTH

## 2013-02-05 NOTE — Progress Notes (Signed)
eLink Physician-Brief Progress Note Patient Name: Garrett Reese DOB: 06-Oct-1921 MRN: [id number]  Date of Service  02-04-13   HPI/Events of Note  Hyperglycemia with blood sugars increaseing   eICU Interventions  Plan: Changed to resistant SSI q4 hours   Intervention Category Intermediate Interventions: Hyperglycemia - evaluation and treatment  DETERDING,ELIZABETH 2013-02-04, 12:34 AM

## 2013-02-05 NOTE — Progress Notes (Signed)
Discussed with daughter, grandchildren in the room. They all  agree to start comfort care following pt whishes.

## 2013-02-05 NOTE — Progress Notes (Signed)
MD notified of pt's troponin 1.01 and oliguria. Bladder scan showed 24 ccs in pt's bladder. Will continue to monitor.

## 2013-02-05 NOTE — Progress Notes (Signed)
Pt would not want to continue nder these circumstances  Mcarthur Rossetti. Tyson Alias, MD, FACP Pgr: [phone] [place] [facility]

## 2013-02-05 NOTE — Progress Notes (Signed)
Inpatient Diabetes Program Recommendations  AACE/ADA: New Consensus Statement on Inpatient Glycemic Control  Target Ranges:  Prepandial:   less than 140 mg/dL      Peak postprandial:   less than 180 mg/dL (1-2 hours)      Critically ill patients:  140 - 180 mg/dL  Pager:  [phone] Hours:  8 am-10pm   Reason for Visit: Elevated glucose in 200s  Inpatient Diabetes Program Recommendations  Insulin - IV drip/GlucoStabilizer: Part of ICU Hyperglycemia Protocol ordered but not following because Glycemic Order set also ordered.  Please clarify whether patient should be on ICU protocol.  This protocol is appropriate and would benefit glucose control    Alfredia Client PhD, RN, BC-ADM Diabetes Coordinator  Office:  [phone] Team Pager:  [phone]

## 2013-02-05 NOTE — Progress Notes (Signed)
Pt asystole in two leads, pupils fixed and dilated, no spontaneous respirations or apical heart tones noted. All verified by second RN, Adelina Mings. Pt is DNR. Dr. Tyson Alias notified. Pt time of death 1414.

## 2013-02-05 NOTE — Progress Notes (Signed)
UR Completed.  Garrett Reese [phone] 01/23/2013  

## 2013-02-05 NOTE — Progress Notes (Signed)
PULMONARY  / CRITICAL CARE MEDICINE  Name: Garrett Reese MRN: [id number] DOB: [birth year]    ADMISSION DATE:  02/03/2013 BRIEF PATIENT DESCRIPTION: 79 yo with COPD, lung CA s/p lobectomy? And recent hospitalization admitted with HCAP who developed respiratory arrest on the floor, intubated by anesthesia and brought to ICU for further management.   SIGNIFICANT EVENTS / STUDIES:  2/17 Admitted with CAP  2/18 Respiratory arrest, received CPR at [facility], intubated, brought to ICU  2/20 Renal ultrasound: no obstruction. Slight atrophy of R kidney.   LINES / TUBES:  Foley 2/17 >>>  OETT 2/18 >>>  OGT 2/18 >>>   Arterial Line 2/20>> CVC2/ 19>>>  CULTURES:  2/18 Blood >>> Negative  2/18 Respiratory >>> not collected yet  2/18 Urine>>> 100 000 Yeast   ANTIBIOTICS:  Zosyn 2/17 >>> stop day 2/24 vanc 2/17 >>>2/18  Linazolid 2/18>>>2/20  Levaquin 2/18 >>>2/19  Diflucan 2/20>>>   SUBJECTIVE: unresponsive, critically ill, no UOP.  VITAL SIGNS: Temp:  [96.8 F (36 C)-98.9 F (37.2 C)] 98.6 F (37 C) (02/21 0438) Pulse Rate:  [45-107] 72 (02/21 0800) Resp:  [14-27] 27 (02/21 0800) BP: (71-147)/(30-86) 92/63 mmHg (02/21 0800) SpO2:  [88 %-100 %] 94 % (02/21 0800) FiO2 (%):  [40 %-100 %] 50 % (02/21 0800)  HEMODYNAMICS: CVP:  [13 mmHg-18 mmHg] 14 mmHg  VENTILATOR SETTINGS: Vent Mode:  [-] PRVC FiO2 (%):  [40 %-100 %] 50 % Set Rate:  [14 bmp-24 bmp] 24 bmp Vt Set:  [500 mL] 500 mL PEEP:  [5 cmH20] 5 cmH20 Plateau Pressure:  [19 cmH20-27 cmH20] 24 cmH20  INTAKE / OUTPUT: Intake/Output     02/20 0701 - 02/21 0700 02/21 0701 - 02/22 0700   I.V. (mL/kg) 4024.5 (41.5) 96.7 (1)   NG/GT 660 30   IV Piggyback 550    Total Intake(mL/kg) 5234.5 (54) 126.7 (1.3)   Urine (mL/kg/hr) 120 (0.1)    Total Output 120     Net +5114.5 +126.7        Stool Occurrence 1 x      PHYSICAL EXAMINATION:  General: Unresponsive to painful stimuli, mechanically ventilated  Neuro: Intubated and  sedated  HEENT: PERRL  Cardiovascular: RRR, no m/r/g  Lungs: roncchi bases, calm on wean  Abdomen: Soft, nontender, bowel sounds diminished  Musculoskeletal: Moves all extremities. Generalized edema Skin: Multiple ecchymoses. Skin oozing. mottling on lower extremities up to knees bilaterally.  LABS:  Recent Labs Lab 01/27/2013 0734  01/25/13 0301  01/26/13 0415 01/27/13 0450 01/27/13 1221 01/27/13 1536 01/27/13 1600 01/27/13 1615  01/27/13 1744 01/27/13 2150 02/04/2013 0141 02/04/2013 0440  HGB 12.5*  --  11.0*  --  9.4* 8.8*  --  10.2*  --   --   --   --   --   --   --   WBC 18.0*  --  12.8*  --  9.5 12.6*  --  24.6*  --   --   --   --   --   --   --   PLT 116*  --  88*  --  61* 48*  --  52*  --   --   --   --   --   --   --   NA 140  --  144  --  143 144  --  142  --   --   < >  --  137 137 138  K 4.9  --  4.9  --  4.2  4.1  --  5.4*  --   --   < >  --  4.4 4.1 4.0  CL 97  --  98  --  100 100  --  100  --   --   < >  --  95* 94* 94*  CO2 32  --  36*  --  33* 34*  --  25  --   --   < >  --  23 24 24   GLUCOSE 217*  --  177*  --  178* 193*  --  232*  --   --   < >  --  334* 266* 238*  BUN 58*  --  64*  --  86* 108*  --  114*  --   --   < >  --  117* 123* 124*  CREATININE 1.57*  --  1.72*  --  2.04* 2.33*  --  2.62*  --   --   < >  --  2.76* 3.02* 3.05*  CALCIUM 9.3  --  8.8  --  8.5 8.2*  --  8.4  --   --   < >  --  8.2* 8.3* 8.2*  MG  --   --   --   --   --   --   --  2.8*  --   --   --   --   --   --   --   PHOS  --   --   --   --   --   --   --  6.8*  --   --   --   --   --   --   --   AST 50*  --  70*  --   --  97*  --   --   --   --   --   --   --   --   --   ALT 52  --  56*  --   --  114*  --   --   --   --   --   --   --   --   --   ALKPHOS 153*  --  171*  --   --  125*  --   --   --   --   --   --   --   --   --   BILITOT 0.6  --  0.7  --   --  0.4  --   --   --   --   --   --   --   --   --   PROT 7.0  --  6.3  --   --  5.3*  --   --   --   --   --   --   --   --   --    ALBUMIN 3.0*  --  2.7*  --   --  2.1*  --   --   --   --   --   --   --   --   --   APTT  --   --  34  --   --   --   --   --   --   --   --   --   --   --   --   INR  --   --  1.38  --   --   --   --   --   --   --   --   --   --   --   --  LATICACIDVEN 4.4*  --   --   --   --   --   --   --  6.1*  --   --   --   --   --   --   TROPONINI <0.30  < > <0.30  < >  --   --   --   --  <0.30  --   --  0.60* 1.01*  --  1.50*  PROCALCITON  --   --  0.34  --  0.40 0.47  --   --   --   --   --   --   --   --   --   PROBNP 8959.0*  --   --   --   --   --   --   --   --   --   --   --   --   --   --   PHART  --   --   --   < >  --   --  7.168*  --  7.262* 7.294*  --   --   --   --   --   PCO2ART  --   --   --   < >  --   --  86.5*  --  57.0* 74.0*  --   --   --   --   --   PO2ART  --   --   --   < >  --   --  61.9*  --  317.0* 109.0*  --   --   --   --   --   < > = values in this interval not displayed.  Recent Labs Lab 01/27/13 1556 01/27/13 1938 01/27/13 2353 01/12/2013 0435 01/12/2013 0846  GLUCAP 183* 272* 283* 223* 217*    CXR:2/20 Negative for pneumothorax. No interval change in bilateral pleural  effusions and basilar airspace disease.  2/21 pending  ASSESSMENT / PLAN:  PULMONARY  A: Acute on chronic respiratory failure. COPD with exacerbation. HCAP vs aspiration pneumonia. Cor pulmonale by recent echo (PAP 39).  P:  No able to wean and back to full supports. Now on pressors. CXR unchanged. Continue Nebs  On solumedrol, remain No lasix since pt is on pressors Last abg reviewed, if aggressive increase MV  CARDIOVASCULAR  A: Transient hypotension post intubation / sedation. CHF by record, but recent echo demonstrated normal systolic function and no comment on diastolic dysfunction.  AFib NSTEMI v cardiac strain from hypotension Cardiac arrest 2/20 - MI? hyperK CVP 13. P:  On Pressors. Neo  On amiodarone for rate control, reduced after 6 hrs Goal MAP>60  Troponin trending up.   SCD's (low platelet for anticoag)  RENAL  A: Acute on chronic renal failure, worsening and pt is oliguric. P:  -kvo  -trend CVP -Renal ultrasound: no obstruction. Slight atrophy of R kidney.  -discussion with family pt may be transitioning to comfort care. Would not benefit HD, cvvhd would be futile  GASTROINTESTINAL  A: Possible dysphagia.  P:  Tolerating TF, hold, for comfort? Protonix for GI Px  BM noted   HEMATOLOGIC  A: Anemia,Thrombocytopenia >> stable. Worsening WBC Hematuria, mild blood tinged secretions  P:  Trend CBC  Recent hosp exposure, likely hep exposure, HITT assay pending  scd   INFECTIOUS  A: HCAP vs aspiration pneumonia.  P:  B culture negative X2, urine culture positive for yeast.  Continue Diflucan  Continue  on Zosyn till 2/24  ENDOCRINE  A: DM2. Hyperglycemia.  P:  ICU Glycemic Control Protocol Phas 1   NEUROLOGIC  A: Acute encephalopathy. S/p arrest P:  Goal RASS 0  Fentanyl  Likely some anoxia of concner  We spoke with  Daughter and they are moving towards the idea of Comfort Care. Family are visiting now. Will eval vent needs, morphine  Signed  D. Piloto Rolene Arbour, MD  Family Medicine PGY-2   10-Feb-2013, 9:08 AM I have fully examined this patient and agree with above findings.    And edited in full  Ccm time 30 min   Mcarthur Rossetti. Tyson Alias, MD, FACP Pgr: [phone] [facility]

## 2013-02-05 NOTE — Progress Notes (Signed)
Chaplain Note:  Chaplain visited with pt and pt's family.  Pt was in bed, intubated, asleep, and did not communicate during this visit.  Pt's daughter and grandchildren were gathered at bedside.  Chaplain provided spiritual comfort and support for pt's family.  Pt's daughter struggled with end of life decisions for pt.  Chaplain assisted pt's daughter in processing her thoughts and feelings regarding pt's end of life.  Chaplain will follow up as needed.  01/14/2013 1300  Clinical Encounter Type  Visited With Patient and family together  Visit Type Spiritual support  Referral From Nurse  Spiritual Encounters  Spiritual Needs Emotional  Stress Factors  Patient Stress Factors Health changes  Family Stress Factors Major life changes;Loss of control (Pt nearing end of life)  Verdie Shire, Chaplain [phone]

## 2013-02-05 NOTE — Progress Notes (Signed)
RR decreased to 12 per MD.  

## 2013-02-05 NOTE — Progress Notes (Signed)
Pt extubated to comfort care per MD order.RT will continue to monitor.

## 2013-02-05 NOTE — Progress Notes (Signed)
Notified Dr. Darrick Penna of pt's MAP less than 60 on 200 mcg of Neo. No new orders at this time. Will continue to monitor.

## 2013-02-05 DEATH — deceased

## 2013-02-11 NOTE — Discharge Summary (Signed)
NAME:  TYRONN, GOLDA NO.:  [id number]  MEDICAL RECORD NO.:  [id number]  LOCATION:  2104                         FACILITY:  [facility]  PHYSICIAN:  Nelda Bucks, MD DATE OF BIRTH:  1921-02-21  DATE OF ADMISSION:  02/23/2013 DATE OF DISCHARGE:  01/09/2013                              DISCHARGE SUMMARY   DEATH SUMMARY:  This is an 79 year old with COPD and lung cancer, status post lobectomy at recent hospitalization, admitted with hospital- acquired pneumonia.  He developed respiratory arrest on the floor requiring intubation by Anesthesiology and was brought to the ICU for further management.  HOSPITAL COURSE:  Hospital course was significant for being admitted with community-acquired pneumonia on 02/23/2013; has, as stated above, respiratory arrest.  On January 25, 2013, was discharged to nursing home, was intubated and brought to the ICU.  On January 27, 2013, the patient had renal ultrasound with no obstruction, slight atrophy of the right kidney.  The patient had, as noted, A-line on January 27, 2013, CVL on January 26, 2013, __________ on January 25, 2013.  Blood cultures were negative.  __________ culture was not collected __________ urine __________ 100,000 colonies of yeast.  The patient being treated with Diflucan and antimicrobial.  The patient remained unresponsive to painful stimuli, being mechanically ventilated. Chest x-ray was negative for pneumothorax.  From cardiac standpoint, there was concern for non-STEMI and cardiac stress, and the patient suffered from acute on chronic renal failure.  From an infectious disease standpoint, the patient was being treated for HCAP versus aspiration __________.  The patient was status post cardiac arrest, concerning for encephalopathy.  It was discussed __________ patient's daughter for which he was eventually moved to comfort care.  Comfort care was provided and the patient expired.  FINAL  DIAGNOSES UPON DEATH: 1. 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 2. Pulseless cardiac arrest secondary to likely heathcare-associated     pneumonia. 3. Chronic obstructive pulmonary disease, history of lung cancer,     status post lobectomy. 4. Acute on chronic renal failure. 5. Sepsis. 6. Yeast in the urine. 7. New onset of aspiration pneumonia, likely during cardiac arrest.     Nelda Bucks, MD     DJF/MEDQ  D:  02/10/2013  T:  02/11/2013  Job:  [id number]

## 2013-03-23 ENCOUNTER — Ambulatory Visit: Payer: Medicare Other | Admitting: Pulmonary Disease

## 2014-02-19 IMAGING — CR DG CHEST 1V PORT
1 series · 1 of 1 positions shown · non-contrast
Comparison: 01/24/2013

CLINICAL DATA: Post procedure

PORTABLE CHEST - 1 VIEW

[AP]
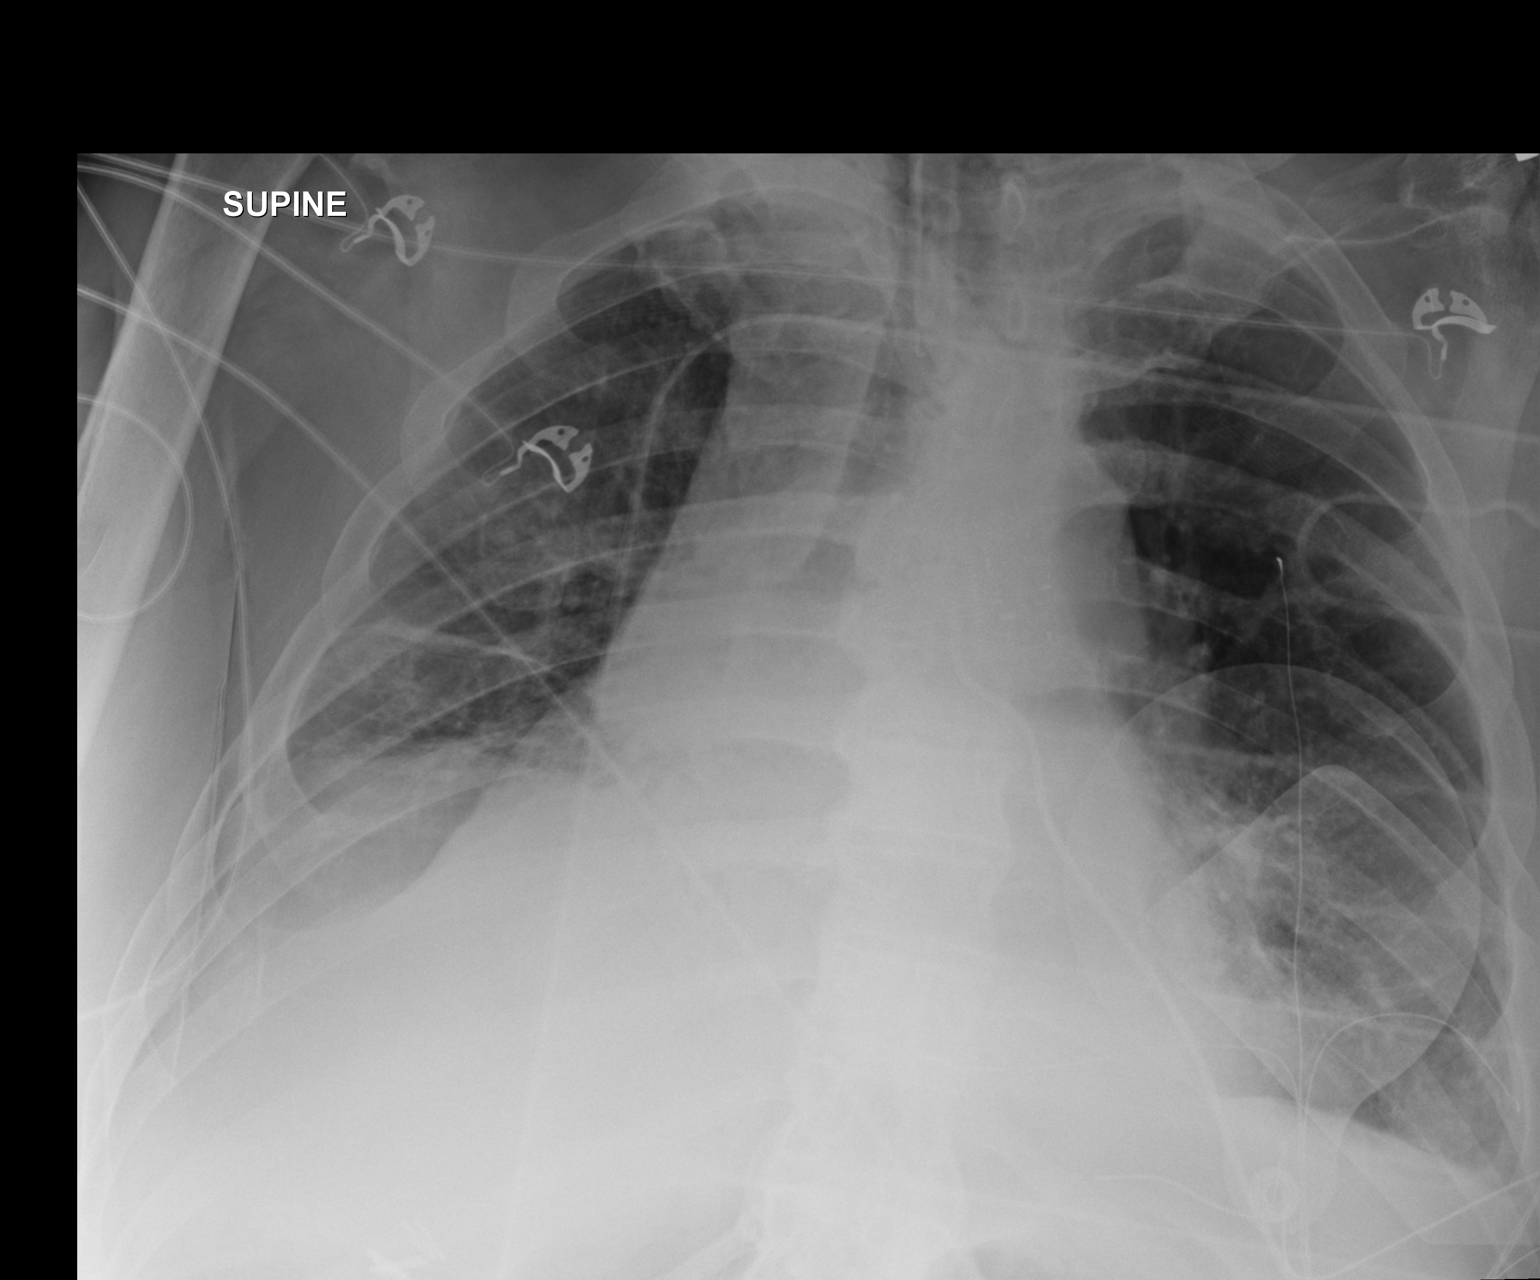

[1 of 1 positions shown; findings below may reference images not displayed]

FINDINGS: Prominent cardiomediastinal contours, similar to prior.
Endotracheal tube tip 6.4 cm proximal to the carina.  Bibasilar
opacities and likely small effusions, similar to prior. Mild
perihilar interstitial prominence, similar.  No pneumothorax.  No
acute osseous finding.
IMPRESSION: Endotracheal tube tip 6.4 cm proximal to the carina.

Otherwise, no significant change.

## 2014-02-21 IMAGING — US US RENAL PORT
1 series · 14 of 25 positions shown · non-contrast
Comparison: CT scan of the abdomen dated 03/30/2007.

CLINICAL DATA: Acute renal failure.

RENAL/URINARY TRACT ULTRASOUND COMPLETE

[Series 1: us renal port · 0.34mm/px · 14 of 25 slices shown]
[im 1/25]
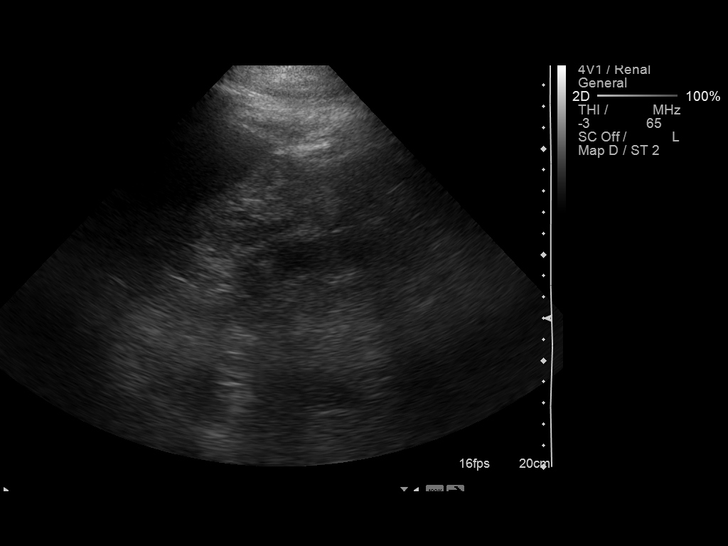
[im 3/25]
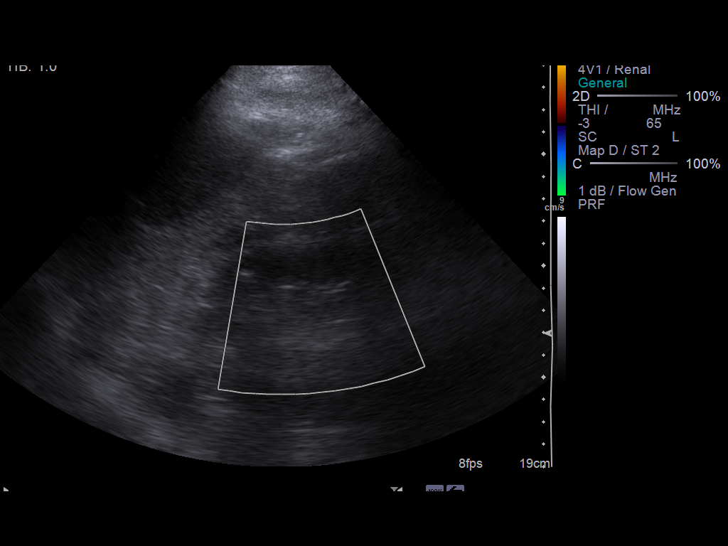
[im 5/25]
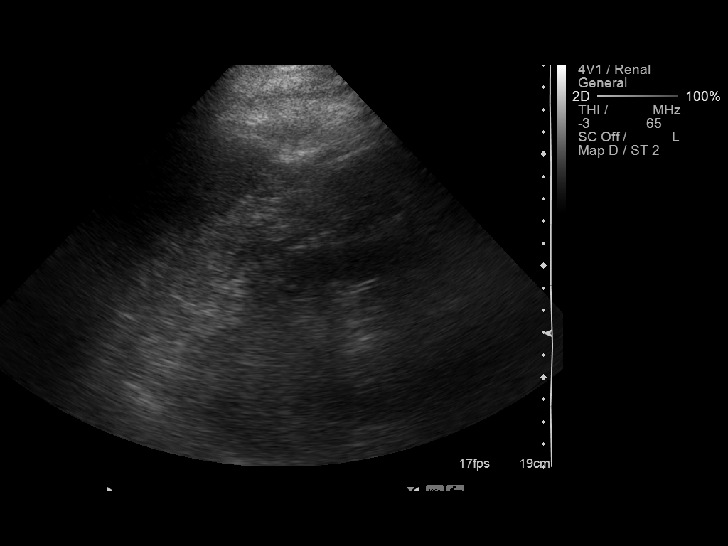
[im 7/25]
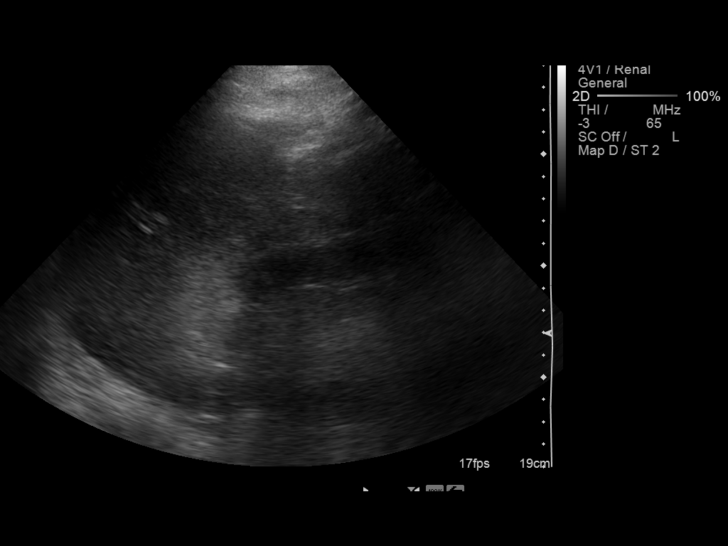
[im 9/25]
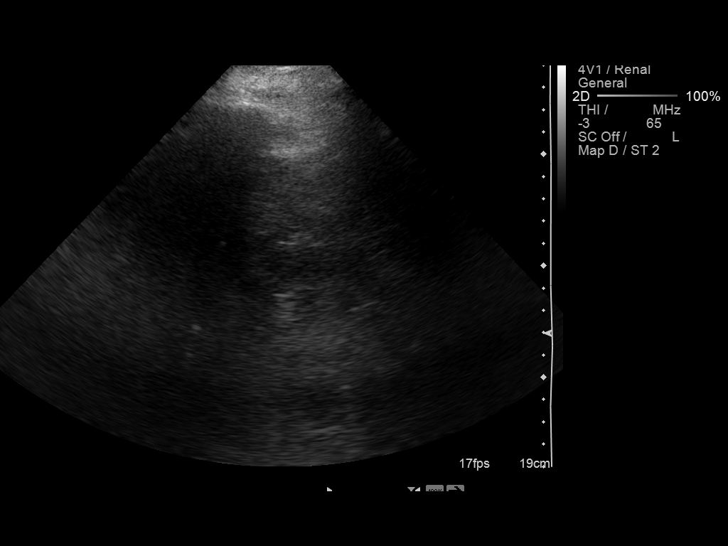
[im 10/25]
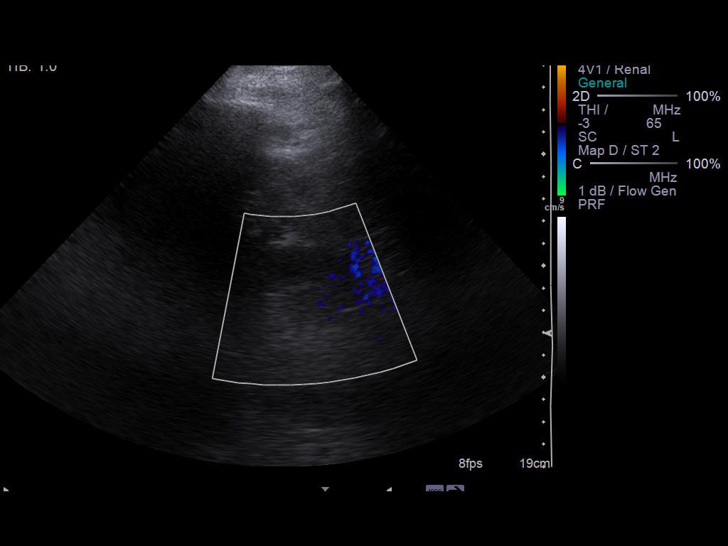
[im 12/25]
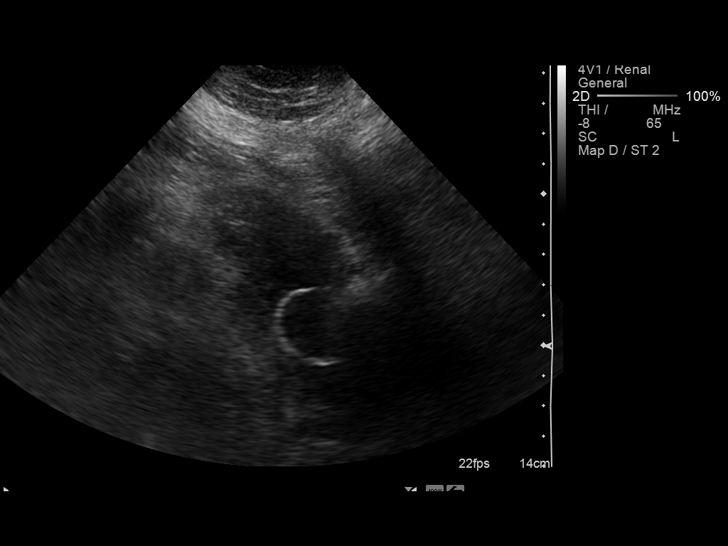
[im 14/25]
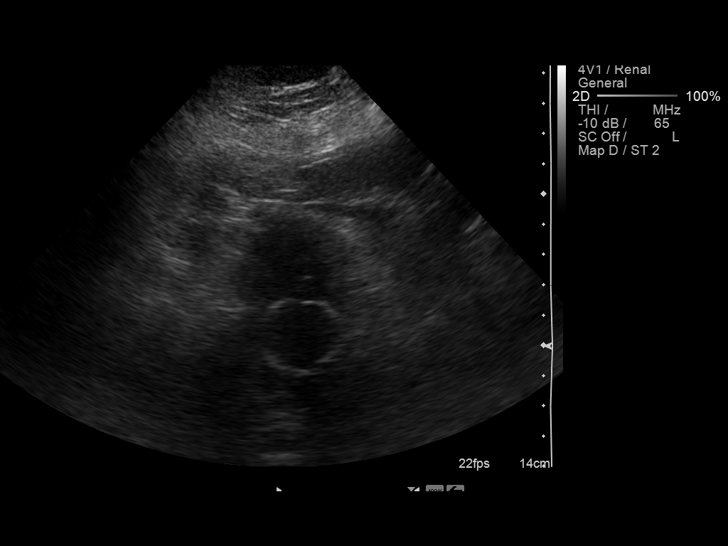
[im 16/25]
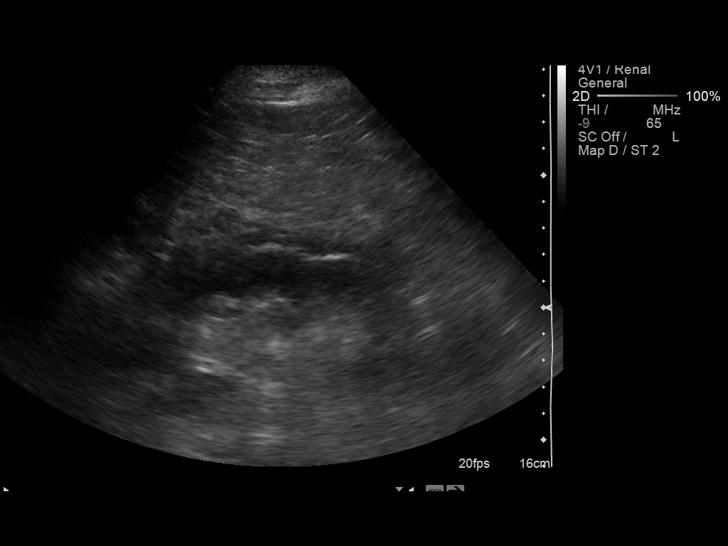
[im 17/25]
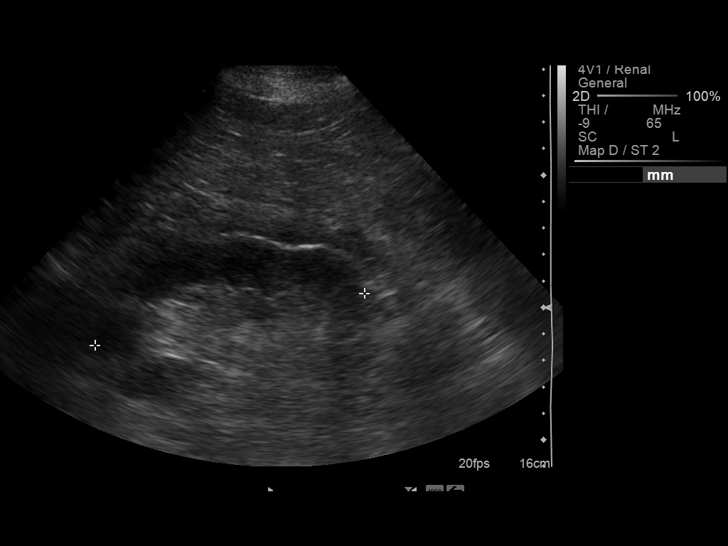
[im 19/25]
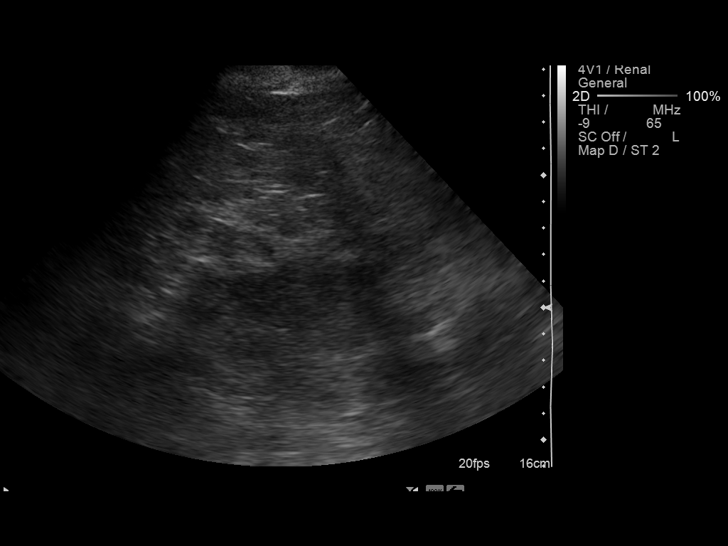
[im 21/25]
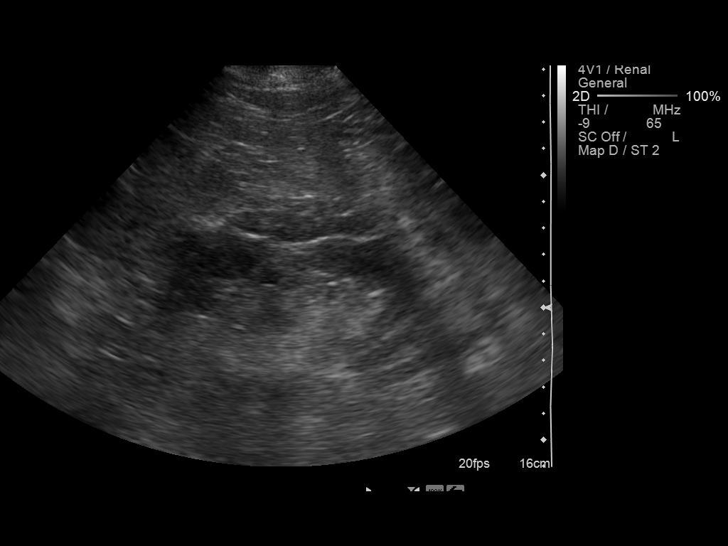
[im 23/25]
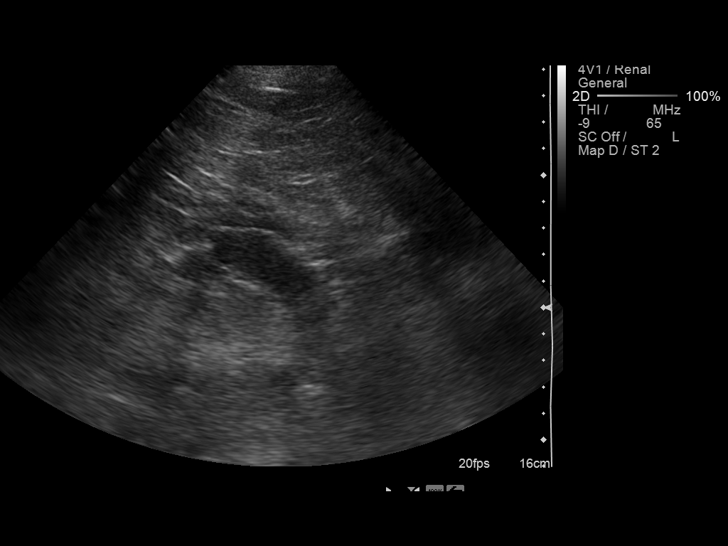
[im 25/25]
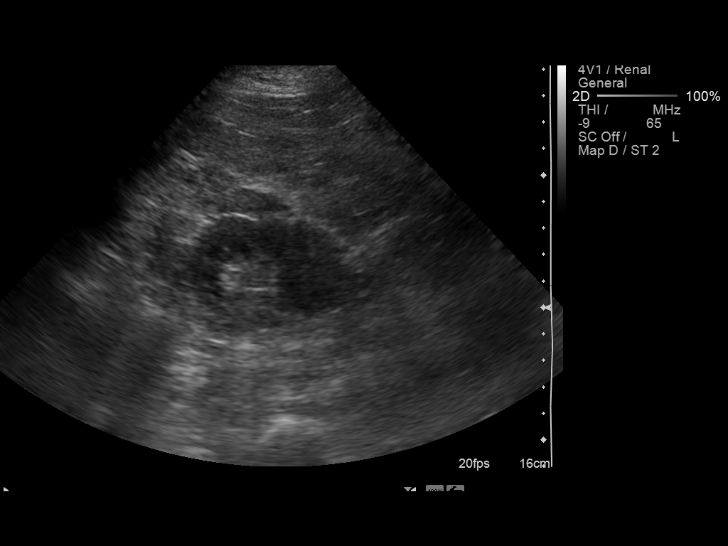

[14 of 25 positions shown; findings below may reference images not displayed]

FINDINGS: Right Kidney: The right kidney is slightly atrophic at 9.0 cm.
Cortex is not thinned.  No hydronephrosis.

Left Kidney:  Normal.  10.4 cm in length.

Bladder:  The bladder is empty with a Foley catheter in place.
IMPRESSION: No evidence of renal obstruction.

## 2014-02-21 IMAGING — CR DG CHEST 1V PORT
1 series · 1 of 1 positions shown · non-contrast
Comparison: 01/26/2013.

CLINICAL DATA: Evaluate endotracheal tube.

PORTABLE CHEST - 1 VIEW

[AP]
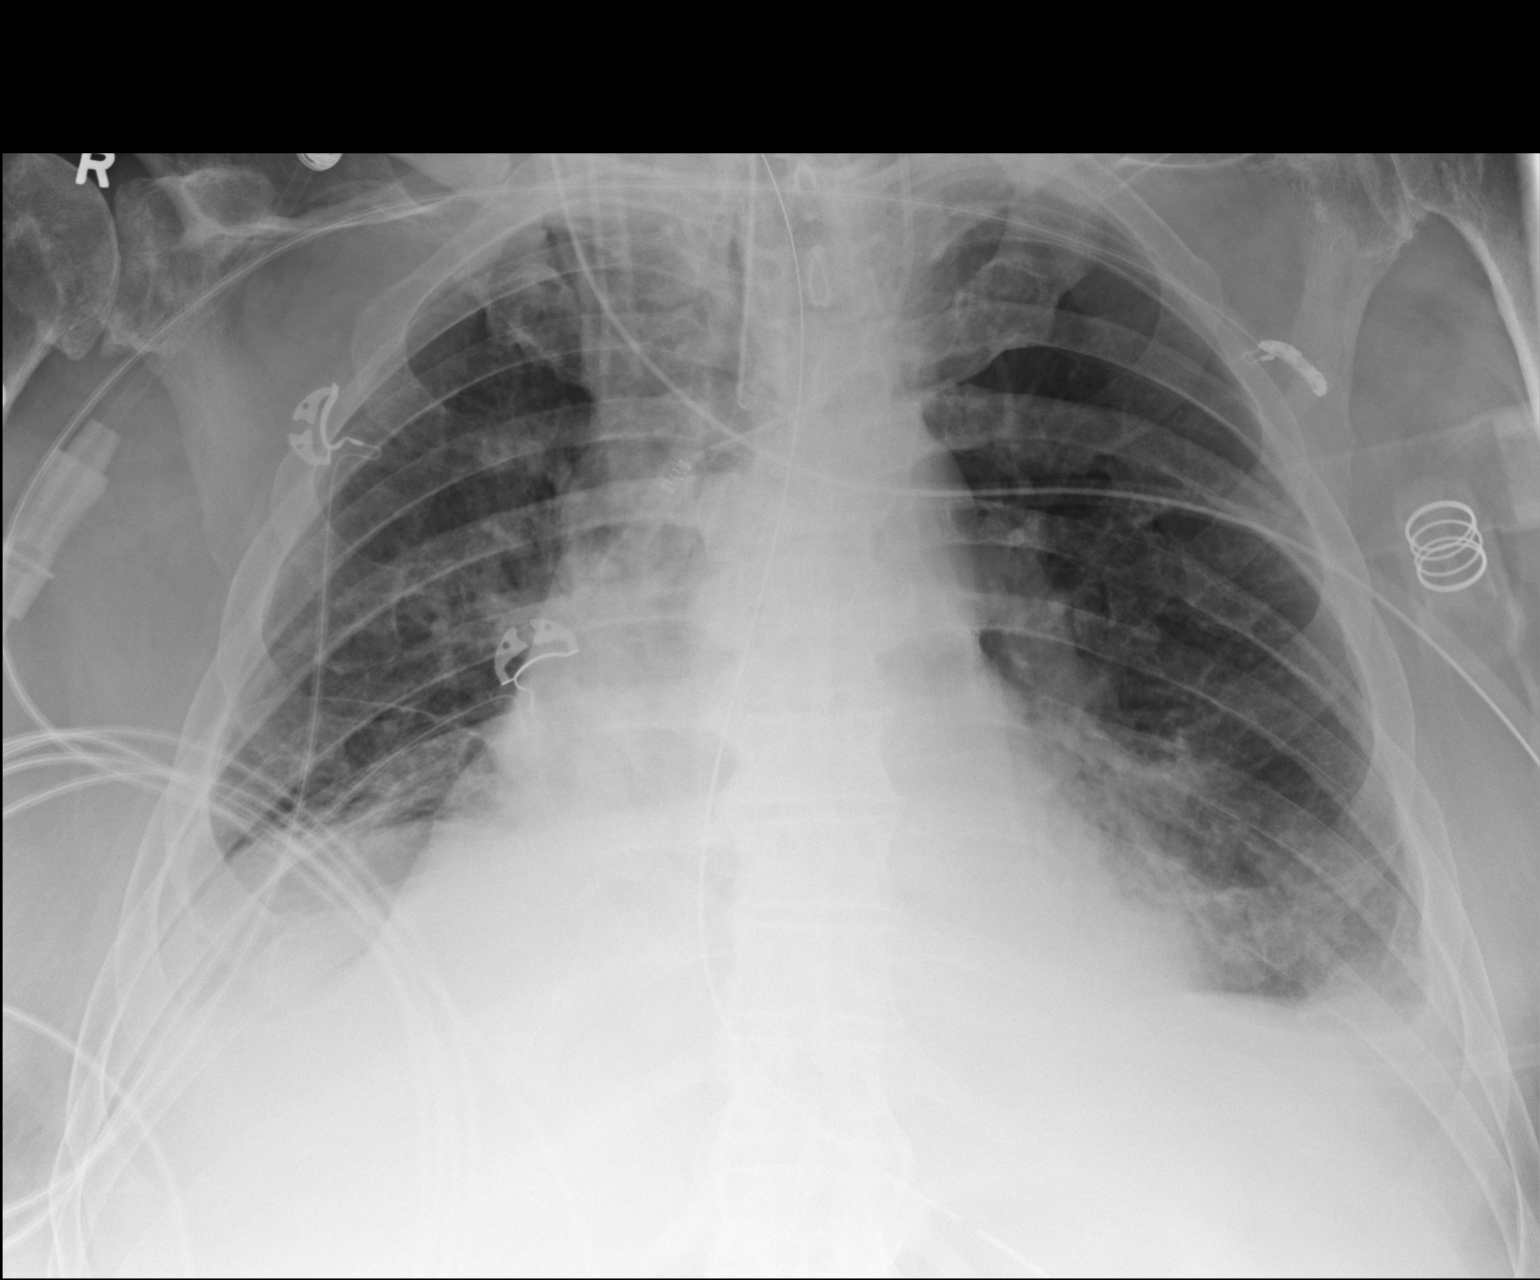

[1 of 1 positions shown; findings below may reference images not displayed]

FINDINGS: The support apparatus is stable.  The heart remains
enlarged and there are persistent bilateral pleural effusions and
bibasilar atelectasis.  No pulmonary edema or pneumothorax.
IMPRESSION: 1.  Small bilateral pleural effusions persist with overlying
atelectasis.
2.  Stable support apparatus.

## 2014-02-21 IMAGING — CR DG CHEST 1V PORT
2 series · 2 of 2 positions shown · non-contrast
Comparison: Chest 01/27/2013.

CLINICAL DATA: Question pneumothorax.

PORTABLE CHEST - 1 VIEW

[AP (1 of 2)]
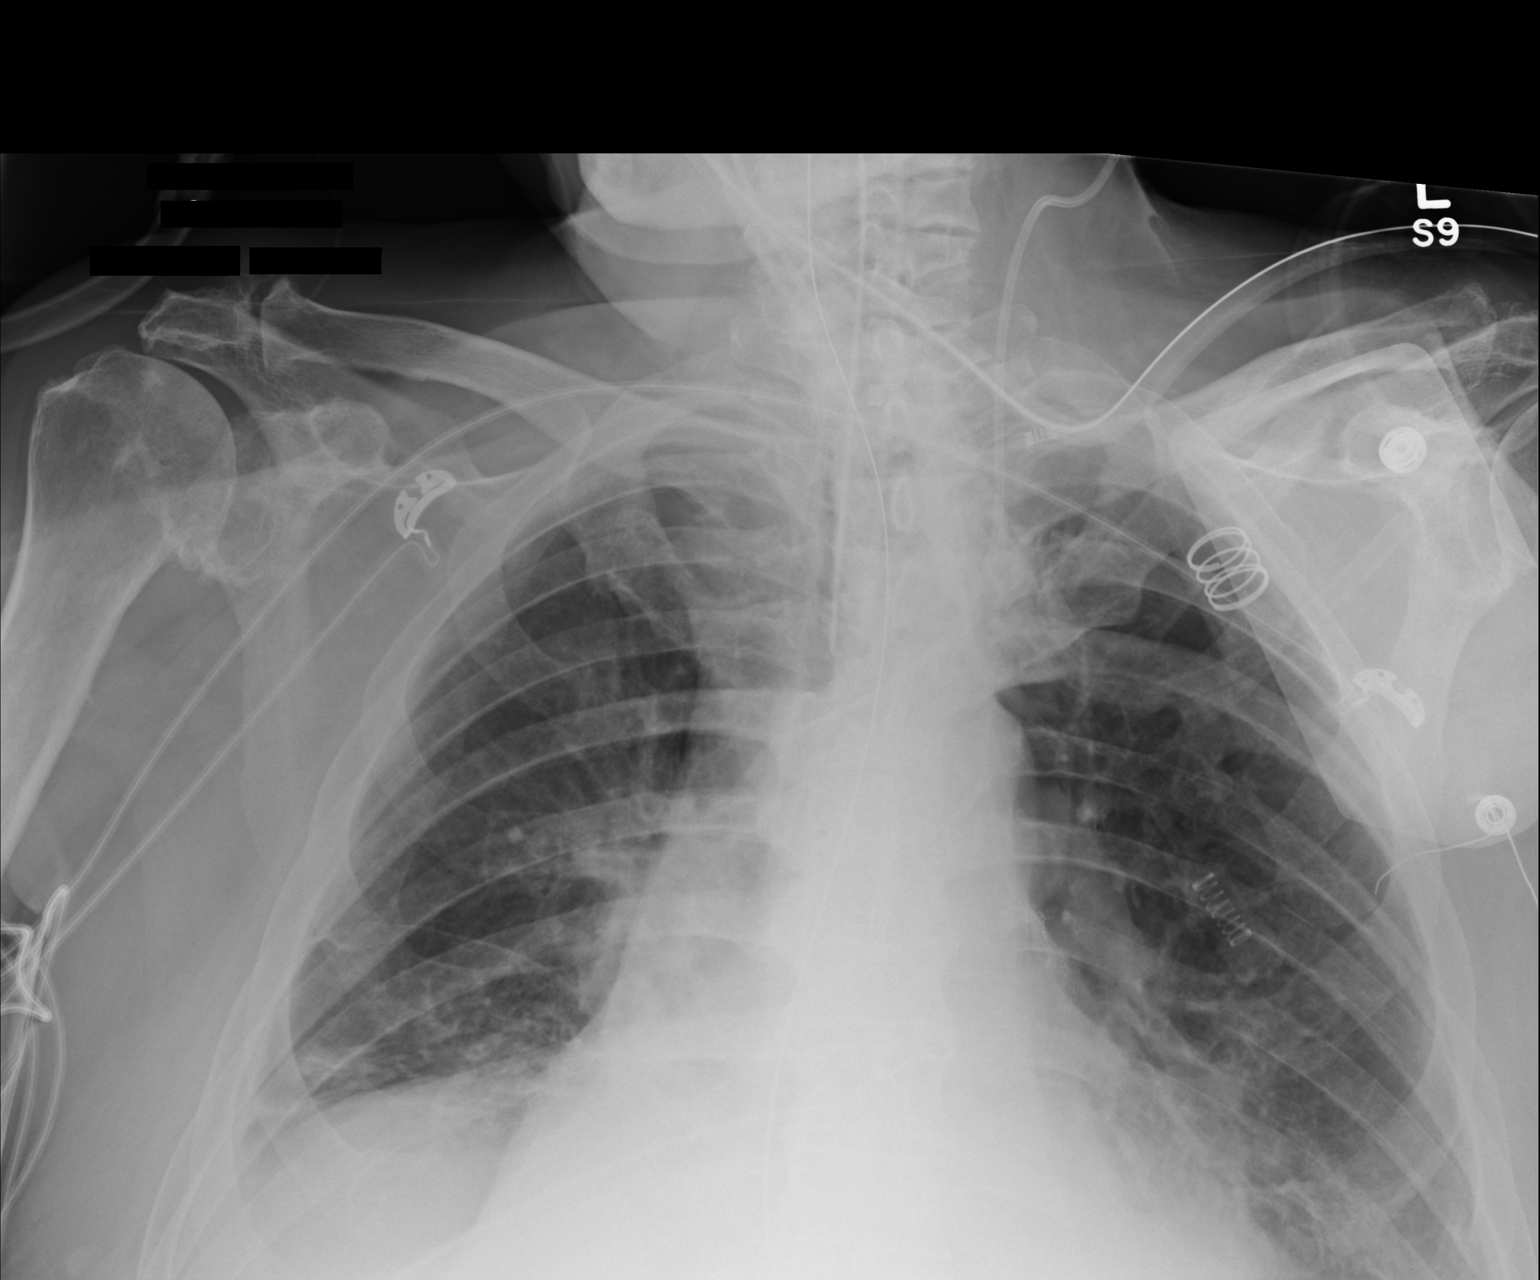

[AP (2 of 2)]
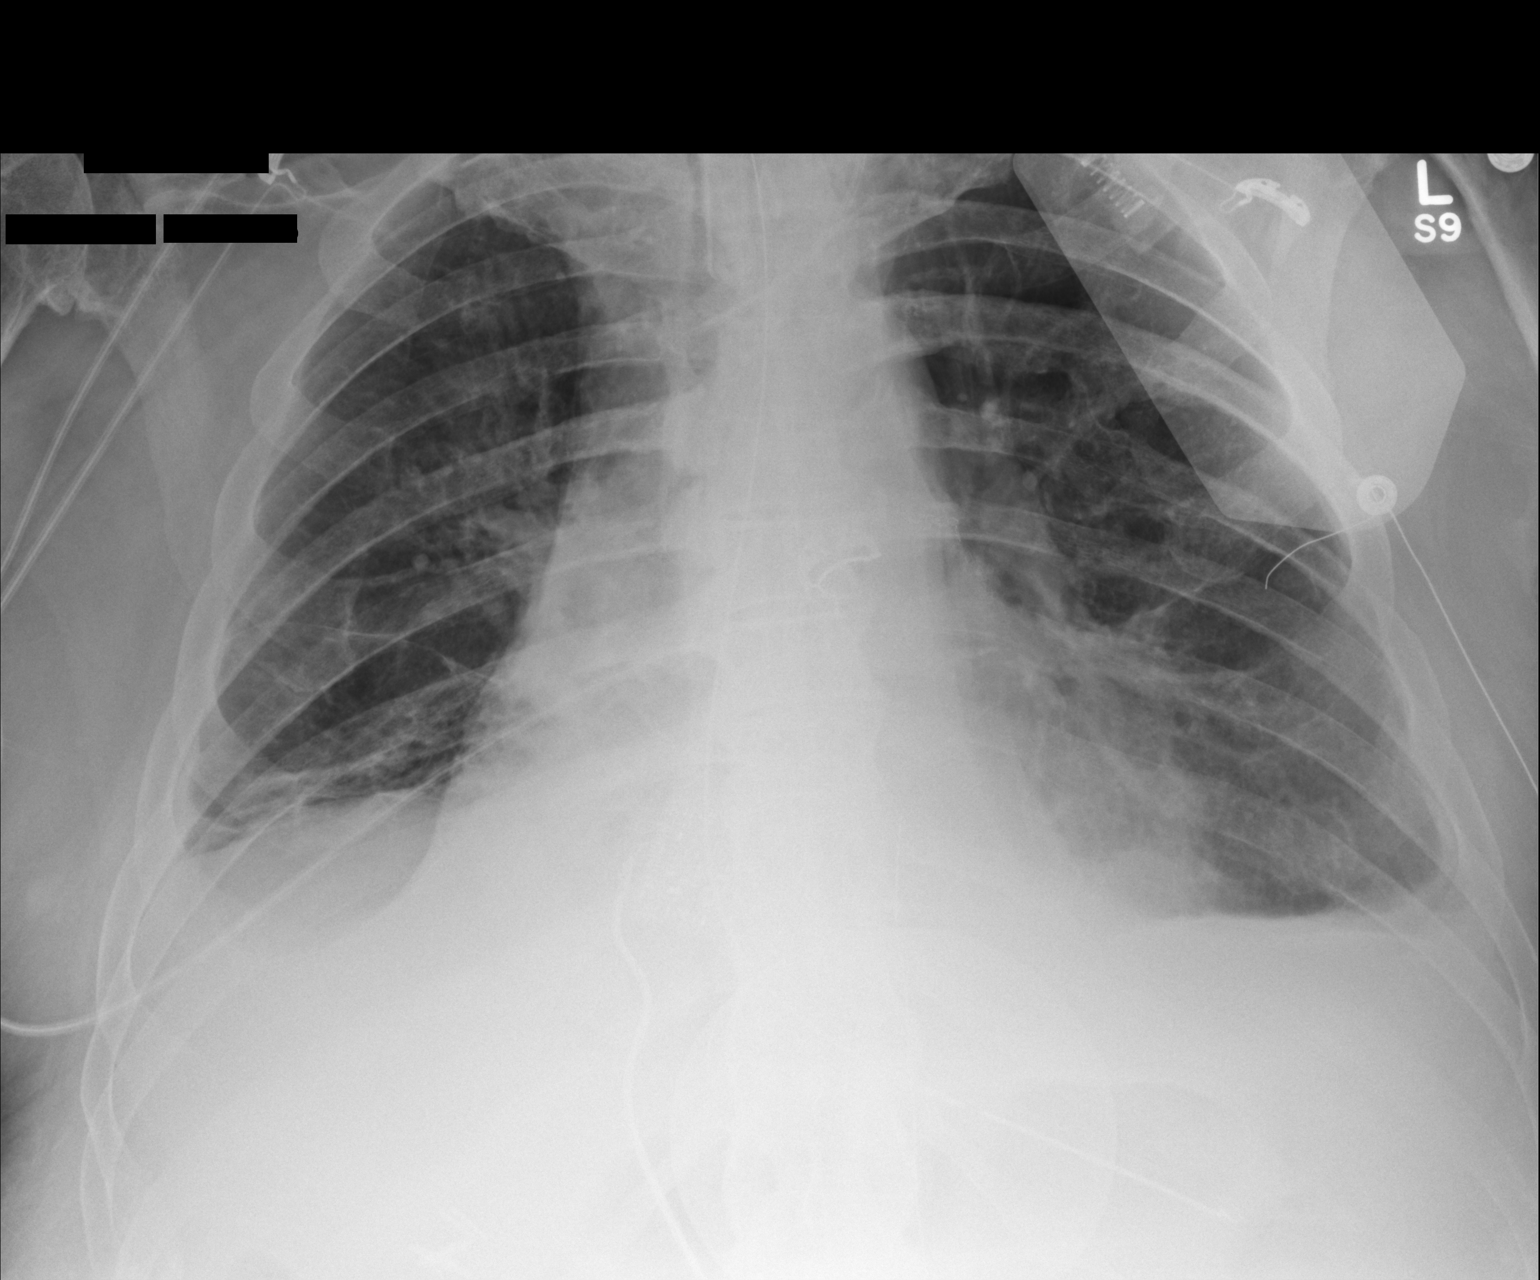

[2 of 2 positions shown; findings below may reference images not displayed]

FINDINGS: Support tubes and lines are unchanged.  Small bilateral
pleural effusions and basilar airspace disease also appear
unchanged.  No pneumothorax identified.  There is cardiomegaly.
IMPRESSION: Negative for pneumothorax.  No interval change in bilateral pleural
effusions and basilar airspace disease.

## 2015-03-27 NOTE — Consult Note (Signed)
General Aspect 81 year old Caucasian male with COPD, smoking hx, hx of lung cancer, s/p resection, on chronic oxygen, hx of CHF (details unavailable), presenting with dry nonproductive cough x 2 days, "unable to get it up, more SOB when I cough".  Cardiology was consulted for possible diastolic CHF, atrial fib with RVR.   He reports no fever or chest pain. He has baseline SOB with exertion and uses an oxygen generator at home.  Also with chronic peripheral edema. No recent admissions for bronchitis or CHF exacerbation. He take "four lasix" a day and ususally this keeps him in balance.   In the Emergency Department his chest x-ray showed mild right pleural effusion/ mild pulmonary vascular congestion, B-type natriuretic peptide is elevated (>4000),  unexplained elevated white blood cell count, atrial fibrillation with rapid ventricular rate.  mild elevation of troponin x 2 (stable).    Present Illness . PAST MEDICAL HISTORY:  1. Lung cancer status post resection in 1991.  2. Chronic obstructive pulmonary disease, home oxygen dependent on 2 liters. 3. Chronic atrial fibrillation.  4. Patient was also notified in the past that he has congestive heart failure. He was told that about five years ago.   PAST SURGICAL HISTORY: History of cholecystectomy five years ago.   SOCIAL HABITS: Ex chronic smoker. He quit smoking in 1981. Prior to that he used to smoke 1-1/2 a pack a day since age of a 58. No history of alcoholism.   SOCIAL HISTORY: He is widowed. Lives at home. Cared by his daughter. 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   FAMILY HISTORY: His father died at the age of 56 from a heart attack. His mother died at the age of 82 after complications of ruptured hernia complicated by sepsis.   Physical Exam:   GEN well developed, well nourished, no acute distress    HEENT red conjunctivae    NECK supple    RESP normal resp effort  on supplemental oxygen, scant rales    CARD Irregular rate  and rhythm  No murmur    ABD denies tenderness  soft    LYMPH negative neck    EXTR positive edema, Trace to 1 + pitting to the mid shin b/l, right >left    SKIN normal to palpation    NEURO motor/sensory function intact    PSYCH alert, A+O to time, place, person, good insight   Review of Systems:   Subjective/Chief Complaint cough, edema SOB (resolved)    General: No Complaints    Skin: No Complaints    ENT: No Complaints    Eyes: No Complaints    Neck: No Complaints    Respiratory: Frequent cough  Short of breath    Cardiovascular: Dyspnea    Gastrointestinal: No Complaints    Genitourinary: No Complaints    Vascular: No Complaints    Musculoskeletal: No Complaints    Neurologic: No Complaints    Hematologic: No Complaints    Endocrine: No Complaints    Psychiatric: No Complaints    Review of Systems: All other systems were reviewed and found to be negative    Medications/Allergies Reviewed Medications/Allergies reviewed    (Removed):        Admit Diagnosis:   CONGESTIVE HEART FAILURE: 16-Jul-2012, Active, CONGESTIVE HEART FAILURE      Admit Reason:   CHF (congestive heart failure): (428.0) 16-Jul-2012, Active, ICD9, Congestive heart failure, unspecified  Home Medications: Medication Instructions Status  Spiriva 18 mcg inhalation capsule 1 each inhaled once a  day Active  Symbicort 2 puff(s) inhaled 2 times a day Active  warfarin 5 mg oral tablet 1 tab(s) orally once a day Active  Lasix 1 tab(s) orally 4 times a day Active  metoprolol 1.5 tab(s) orally 2 times a day Active  metformin 1 tab(s) orally 2 times a day Active   Lab Results:  Routine Chem:  08-Aug-13 21:20    B-Type Natriuretic Peptide [facility])  4079 (Result(s) reported on 15 Jul 2012 at 10:35PM.)   Glucose, Serum  210   BUN  28   Creatinine (comp)  1.46   Sodium, Serum 141   Potassium, Serum 4.4   Chloride, Serum 98   CO2, Serum  36   Calcium (Total), Serum 9.5   Anion Gap 7    Osmolality (calc) 293   eGFR (African American)  49   eGFR (Non-African American)  42 (eGFR values <64mL/min/1.73 m2 may be an indication of chronic kidney disease (CKD). Calculated eGFR is useful in patients with stable renal function. The eGFR calculation will not be reliable in acutely ill patients when serum creatinine is changing rapidly. It is not useful in  patients on dialysis. The eGFR calculation may not be applicable to patients at the low and high extremes of body sizes, pregnant women, and vegetarians.)  09-Aug-13 04:14    Result Comment TROPONIN - RESULTS VERIFIED BY REPEAT TESTING.  - RESULTS PREVIOUSLY CALLED AT 2225  - 07/15/12 LAB  Result(s) reported on 16 Jul 2012 at 05:00AM.  Cardiac:  09-Aug-13 04:14    Troponin I  0.14 (0.00-0.05 0.05 ng/mL or less: NEGATIVE  Repeat testing in 3-6 hrs  if clinically indicated. >0.05 ng/mL: POTENTIAL  MYOCARDIAL INJURY. Repeat  testing in 3-6 hrs if  clinically indicated. NOTE: An increase or decrease  of 30% or more on serial  testing suggests a  clinically important change)  Routine Coag:  08-Aug-13 21:20    Prothrombin  24.8   INR 2.2 (INR reference interval applies to patients on anticoagulant therapy. A single INR therapeutic range for coumarins is not optimal for all indications; however, the suggested range for most indications is 2.0 - 3.0. Exceptions to the INR Reference Range may include: Prosthetic heart valves, acute myocardial infarction, prevention of myocardial infarction, and combinations of aspirin and anticoagulant. The need for a higher or lower target INR must be assessed individually. Reference: The Pharmacology and Management of the Vitamin K  antagonists: the seventh ACCP Conference on Antithrombotic and Thrombolytic Therapy. MNOTR.7116 Sept:126 (3suppl): [id number]. A HCT value >55% may artifactually increase the PT.  In one study,  the increase was an average of 25%. Reference:  "Effect on  Routine and Special Coagulation Testing Values of Citrate Anticoagulant Adjustment in Patients with High HCT Values." American Journal of Clinical Pathology 2006;126:400-405.)   Activated PTT (APTT)  48.8 (A HCT value >55% may artifactually increase the APTT. In one study, the increase was an average of 19%. Reference: "Effect on Routine and Special Coagulation Testing Values of Citrate Anticoagulant Adjustment in Patients with High HCT Values." American Journal of Clinical Pathology 2006;126:400-405.)  Routine Hem:  08-Aug-13 21:20    WBC (CBC)  21.9   RBC (CBC)  4.30   Hemoglobin (CBC) 13.5   Hematocrit (CBC)  39.9   Platelet Count (CBC) 171 (Result(s) reported on 15 Jul 2012 at 09:45PM.)   MCV 93   MCH 31.4   MCHC 33.9   RDW  14.9   EKG:   Interpretation EKG shows atrial  fibrillation rate 120 bpm, possible old anterior MI, LAFB    No Known Allergies:   Vital Signs/Nurse's Notes: **Vital Signs.:   09-Aug-13 07:47   Vital Signs Type Routine   Temperature Temperature (F) 97.6   Celsius 36.4   Pulse Pulse 79   Respirations Respirations 18   Systolic BP Systolic BP 234   Diastolic BP (mmHg) Diastolic BP (mmHg) 66   Mean BP 80   Pulse Ox % Pulse Ox % 96   Pulse Ox Activity Level  At rest   Oxygen Delivery 3L     Impression 81 year old Caucasian male with COPD, smoking hx, hx of lung cancer, s/p resection, on chronic oxygen, hx of CHF (details unavailable), presenting with dry nonproductive cough x 2 days, "unable to get it up, more SOB when I cough".  Cardiology was consulted for possible diastolic CHF, atrial fib with RVR.  1) COugh, SOB: Elevated BNP, edema COncerning for mild diastolic CHF ECHO pending Unable to exclude lung pathology ---Echo pending ---Consider a chest CTA given CXR findings of possible nodule? ---Could repeat CBC in Am to look at WBC. Does not appear to be bronchitis given no significant cough this AM/no sputum or other clinic signs.  2) Atrial  fibrillation: Rate improved this AM Could continue outpt doses of metoprolol BID  3) COPD: currently on inhalers, chronic oxygen with long cord. Unable to exclude COPD flare. If sx persist, could use nebs at home  4) Lung CA, s/p resection Unable to rule out recurrent lung CA Consider CTA chest if indicated.   Electronic Signatures: Ida Rogue (MD)  (Signed 09-Aug-13 08:48)  Authored: General Aspect/Present Illness, History and Physical Exam, Review of System, Past Medical History, Health Issues, Home Medications, Labs, EKG , Allergies, Vital Signs/Nurse's Notes, Impression/Plan   Last Updated: 09-Aug-13 08:48 by Ida Rogue (MD)

## 2015-03-27 NOTE — Discharge Summary (Signed)
PATIENT NAME:  Garrett Reese, Garrett Reese MR#:  [id number] DATE OF BIRTH:  November 13, 1921  DATE OF ADMISSION:  07/16/2012 DATE OF DISCHARGE:  07/19/2012  ADMITTING DIAGNOSIS: Decompensated congestive heart failure with right pleural effusion, pulmonary vascular congestion.  DISCHARGE DIAGNOSES: 1. Acute on chronic respiratory failure.  2. Chronic obstructive pulmonary disease exacerbation. 3. Bacterial pneumonia.  4. Congestive heart failure, left heart, acute on chronic, diastolic.  5. Atrial fibrillation/RVR.  6. Elevated troponin, troponin, possible demand ischemia. No acute coronary syndrome. 7. Diabetes with hemoglobin A1c 6.7.  8. Hypertension. 9. Generalized weakness.  10. History of left lung resection for lung carcinoma, stable.  11. Chronic obstructive pulmonary disease.  12. Chronic respiratory failure, oxygen dependent, on 2 liters of oxygen through nasal cannula at home. 13. History of chronic atrial fibrillation, on Coumadin anticoagulation.   DISCHARGE CONDITION: Stable.   DISCHARGE MEDICATIONS: The patient is to resume his outpatient medications which are:  1. Spiriva 18 mcg inhalation daily.  2. Symbicort 160/4.5 two puffs twice daily.  3. Warfarin 5 mg p.o. daily.  4. Lasix 20 mg p.o. daily.  5. Lisinopril 2.5 mg p.o. daily.  6. Zithromax 250 mg p.o. daily for two more days to complete course.  7. Metformin 500 mg p.o. twice daily.  8. Metoprolol 25 mg p.o. twice daily.  9. Aspirin 81 mg p.o. daily.  10. Mucinex 600 mg p.o. twice daily.  11. DuoNebs 3 mL every four hours as needed. Prescription for nebulizer machine was also given.   DIET: 2 gram salt, low fat, low cholesterol, carbohydrate controlled diet, regular consistency.   ACTIVITY LIMITATIONS: As tolerated. The patient refused physical therapy.  FOLLOW-UP:  1. Follow-up appointment with Dr. Renford Dills Cardiology in [place] in two days after discharge.   2. Follow-up appointment with Dr. Donnie Coffin in  [place] in two days after discharge, the patient's primary care physician.    CONSULTANTS:  1. Dr. Fletcher Anon of Cardiology  2. Dr. Angelena Form of Cardiology  3. Dr. Ida Rogue of Cardiology   RADIOLOGIC STUDIES:  1. Chest x-ray, PA and lateral, 07/15/2012, showed small right pleural effusion with mild right basilar opacities that may represent infection or atelectasis. Follow-up PA and lateral chest radiographs are recommended. Subcentimeter nodular density in the right midlung may be calcified given its density to small size. Recommend attention on the follow-up radiograph and comparison with outside prior radiographs. Lobular density overlying the left midlung may represent sequela of ossification along the ribs related to prior thoracotomy. Correlate with outside prior radiographs to ensure stability. If this finding is new, further evaluation with chest CT would be recommended according to radiologist.  2. CT scan of chest without contrast, 07/16/2012, showed  probable underlying right lower lobe and possibly right middle lobe pneumonia. Atelectasis could give a similar appearance. Minimal infiltrate or atelectasis in the left lower lobe and right upper lobe. Underlying emphysematous lung disease is present. Scattered mediastinal lymph nodes are demonstrated. 3. Repeated chest x-ray on 07/18/2012, PA and lateral, showed right middle lobe atelectasis. Mild cardiomegaly. Probable underlying chronic fibrotic changes in the lungs.  4. Echocardiogram, 07/16/2012, left ventricular systolic function is low normal. Ejection fraction equal to 50 to 55%. There is moderate concentric left ventricular hypertrophy. There is moderate apical wall hypokinesis. The right ventricular systolic function is normal. The left atrium is mildly dilated. There is mild mitral regurgitation. Right ventricular systolic pressure is elevated at 40 to 50 mm Hg.  REASON FOR ADMISSION: The patient is an 81 year old Caucasian  male  with past medical history significant for history of chronic respiratory failure, oxygen dependent, history of lung carcinoma in the past as well as tobacco use in the past who presented to the hospital with complaints of dry cough which was getting worse over the past two days prior to admission. Please refer to Dr. Zacarias Pontes admission note on 07/16/2012. The patient presented to the hospital for further evaluation of his cough. Chest x-ray revealed pleural effusion as well as possible atelectasis versus infiltrate. It also showed elevated white blood cell count. Additionally, the patient had atrial fibrillation with rapid ventricular rate of 110 to 115. His temperature was also mildly elevated to 99.2 and oxygen saturation was 92% on 2 liters of oxygen through nasal cannula. The patient's respiration rate was 26 and his blood pressure was well controlled at 126/69. Physical exam showed tachypnea, fine rales at bases on the right, and diminished breath sounds in the right base.   LAB DATA TAKEN IN THE EMERGENCY ROOM: 07/15/2012 glucose 210. B-type Natriuretic Peptide 4079. BUN and creatinine were 28 and 1.46. The patient's CO2 level was elevated to 36, otherwise BMP was unremarkable. The patient's magnesium level was low at 1.7. Troponin was mildly elevated at 0.13, however, MB fraction as well as CK total were within normal limits. On the second set the patient's troponin was 0.14. On the third set also troponin level was 0.14. TSH was normal at 1.09. White blood cell count was markedly elevated at 21.9, hemoglobin 13.5, platelet count 171. Coagulation panel pro-time 24.8, INR 2.2, activated PTT 48.8. Blood cultures taken on 07/15/2012 revealed heavy growth of coagulase negative Staphylococcus in aerobic bottle only which was felt to be possible contamination with skin flora. Urine cultures were showed no growth. Urinalysis was remarkable for 5 red blood cells, 2 white blood cells, mild proteinuria at 100 mg/dL,  otherwise study was unremarkable. The patient's EKG showed atrial fibrillation at 120 beats per minute with rapid ventricular response with premature ventricular or aberrantly conducted complexes. Possible inferior infarct, age undetermined according to EKG criteria and no other EKGs for comparison were found. The patient's chest x-rays are as mentioned above. Initially showed small right pleural effusion as well as mild right basilar opacities which were concerning for possible infection or atelectasis. Because of the patient's mild fevers as well as elevated white blood cell count and pleural effusion, it was felt that patient had acute on chronic respiratory failure due to pneumonia as well as CHF exacerbation.   HOSPITAL COURSE: The patient was admitted to the hospital and he was started on therapy for both problems.  1. In regards to pneumonia, the patient was initiated on Rocephin as well as Zithromax and continued on those antibiotics. Attempt was made to obtain sputum, however, sputum was not obtained. The patient's blood cultures as mentioned above did not show any growth except for coagulase negative Staphylococcus which was felt to be contaminant. The patient finished four days of Rocephin therapy and he is to continue Zithromax for two more days to complete course as outpatient. He also was offered to continue tapering doses of steroids for COPD, however, he refused steroids because of concerns of reaction to it. He was recommended to continue Symbicort as well as Spiriva and inhalation therapy with DuoNebs and nebulizers. Nebulizer machine was prescribed for him upon discharge. He remained afebrile and his white blood cell count  improved on current therapy. On day of discharge, the patient's temperature was 97.6, pulse 92, respiratory rate  20, blood pressure 137/72, saturation 96% on 2 liters of oxygen through nasal cannula and 97 on 3 liters of oxygen through nasal cannula. The patient's white  blood cell count has improved to 12.0 on 07/18/2012. It is recommended to follow the patient's white blood cell count to ensure its stability and normalization with therapy. As mentioned above, the patient is to continue antibiotic therapy. 2. In regards to CHF exacerbation, it was felt to be acute on chronic diastolic CHF. Echocardiogram showed ejection fraction of 50 to 55% as well as LVH. The patient was continued on Lasix and he diuresed well. He had added low dose of lisinopril. Initially lisinopril was not added because of his acute renal failure. His kidney function, however, improved with diuresis and lisinopril was added. It is recommended to follow the patient's kidney function and make decisions about discontinuation or advancement of lisinopril as outpatient. It is also recommended to follow the patient's potassium levels since he is now on Lasix orally and no additional potassium supplementations were ordered for him upon discharge. Advance the patient's lisinopril if his blood pressure tolerates and his kidney function tolerates. 3. In regards to atrial fibrillation, initially the patient presented with rapid ventricular rate. The patient was continued, however, on metoprolol. It was felt that the patient's RVR was very likely due to respiratory cause. He was continued on Coumadin and the patient's INR remained therapeutic. On the day of discharge, the patient's INR is 2.4 and pro-time was 26.5. The patient was advised to continue to follow-up his pro-time as usually he does as outpatient at outpatient clinic with his primary care physician. He is to continue his usual doses of Coumadin.  4. In regards to elevated troponin, it was unclear why the patient had mild elevation of troponin, however, it was unlikely acute coronary syndrome, demand ischemia from hypoxia as well as atrial fibrillation/RVR. The patient was followed by Cardiology while in the hospital and was advised to continue follow-up  with his primary cardiologist as outpatient.  5. In regards to diabetes mellitus, the patient was continued on metformin as soon as his kidney function normalized. His hemoglobin A1c was checked and was found to be 6.7.  6. For history of lung carcinoma, as mentioned above the patient had a CT scan of his chest done which was stable. He had lung cancer status post thoracotomy and resection of left lung more than 22 years ago. The patient's chest x-ray showed nodularity as well as old changes, however, CT scan of his chest showed no recurrent mass but pneumonia. It is recommended, however, to repeat CT scan of chest in a few months when pneumonia resolves to ensure stability.  7. The patient had some generalized weakness while in the hospital, however, he refused any physical therapy at home.   The patient is being discharged home in stable condition with the above-mentioned medications and follow-up.   TIME SPENT: 40 minutes.   ____________________________ Theodoro Grist, MD rv:[id number] D: 07/19/2012 20:21:19 ET T: 07/20/2012 11:34:34 ET JOB#: [id number]  cc: Theodoro Grist, MD, <Dictator> Dr. Renford D[facility] in [facility] Dr. Donnie Coffin Theodoro Grist MD ELECTRONICALLY SIGNED 07/22/2012 14:47

## 2015-03-27 NOTE — Consult Note (Signed)
Brief Consult Note: Diagnosis: Multiple contusions.   Patient was seen by consultant.   Comments: 81 year old male fell several times last few days at home and was admitted to [facility] for  medical reasons.  NO complaints of pain at this time.  Ambulating independently.  ct scan of chest showed possible fractures  and consult ordered.    Exam:  Alert and comfortable.  out of bed in chair and circulation/sensation/motor function good.  No pain in thoracic spine or pelvis.  range of motion of hips good.  Ambulatory without pain.  No pain to percusion in back.  Multiple areas of contusion and ecchymosis.   X-rays: ct scan of chest shows possible fracture t-6.  Also possible pubic ramus fracture.    Imp:  Multiple contusions.  No evidence of symptomatic thoracic or pelvic fracture.   Rx:  Symptomatic care.  Assisted living perhaps.  Electronic Signatures: Park Breed (MD)  (Signed [phone] 13:51)  Authored: Brief Consult Note   Last Updated: 10-Nov-13 13:51 by Park Breed (MD)

## 2015-03-27 NOTE — H&P (Signed)
PATIENT NAME:  Garrett Reese, RENZ MR#:  [id number] DATE OF BIRTH:  [birth year]  DATE OF ADMISSION:  10/16/2012  PRIMARY CARE PHYSICIAN:  Donnie Coffin, MD in [place]: Three falls in three days.   HISTORY OF PRESENT ILLNESS: This is an 81 year old man who lives by himself. His daughter is next door that helps out. The patient is not very ambulatory. He does have an oxygen cable that goes throughout the entire house. He has had three falls in three days. The first two falls he states he tripped over the tubing. Today's fall, he was in open airspace, he got dizzy and lightheaded, and the next thing he knew he was on the floor. No loss of consciousness. He does have some right back pain. In the Emergency Room, he had multiple studies including a CT scan of the chest, abdomen, pelvis, and head and cervical spine which showed a nondisplaced right inferior pubic rami fracture, a T6 vertebral fracture, and ground glass opacities in the lung. The patient also had an elevated white count. Hospitalist Services were contacted for further evaluation. The patient does not complain of any chest pain. He does have shortness of breath with exertion, which is normal, and some cough. No production.   PAST MEDICAL HISTORY:  1. Lung cancer, status post resection.  2. End-stage chronic obstructive pulmonary disease, on chronic 2 liters of oxygen.  3. Chronic atrial fibrillation.  4. History of congestive heart failure. 5. Diabetes.  6. Chronic venous stasis ulcers.   PAST SURGICAL HISTORY:  1. Lung resection.  2. Cholecystectomy.   ALLERGIES: No known drug allergies.   MEDICATIONS:  1. Allegra 24-hour 1 tab daily. 2. Imipramine 25 mg at bedtime.  3. Lasix 40 mg daily.  4. Lisinopril 2.5 mg daily.  5. Metoprolol 50 mg, 1-1/2 tablets daily.  6. Metformin 500 mg twice a day. 7. Mucinex 600 mg twice a day.  8. ProAir 2 puffs as needed for shortness of breath.  9. Spiriva 1 inhalation daily.   10. Symbicort 164.5, 2 puffs twice a day. 11. Temazepam 30 mg at bedtime.  12. Warfarin 5 mg daily.   SOCIAL HISTORY: She lives at home. Daughter lives next door. 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 No smoking. No alcohol. No drug use.   FAMILY HISTORY: Father died at the age of 34 from a heart attack. Mother died of complications of a ruptured hernia with sepsis at age 59.    REVIEW OF SYSTEMS: CONSTITUTIONAL: Positive for chills. No fever. No sweats. Positive for weakness. EYES: He does wear glasses. Ears, nose, mouth, and throat: Decreased hearing. Positive for runny nose. No sore throat. No difficulty swallowing. CARDIOVASCULAR: No chest pain. No palpitations. RESPIRATORY: Positive for shortness of breath with exertion. Positive for cough. No production. No hemoptysis. GASTROINTESTINAL: No nausea. No vomiting. No abdominal pain. Positive for constipation. No bright red blood per rectum. No melena. GENITOURINARY: Positive for burning on urination. No hematuria. MUSCULOSKELETAL: Positive for back pain and right rib pain, arthritis pain. INTEGUMENTARY: Positive for easy bruising. NEUROLOGIC: No fainting or blackouts but dizziness with walking. PSYCHIATRIC: No anxiety or depression. ENDOCRINE: No thyroid problems. HEMATOLOGIC/LYMPHATIC: No anemia. No easy bruising or bleeding.   PHYSICAL EXAMINATION:  VITAL SIGNS: Temperature 98.4, pulse 85, respirations 18, blood pressure 146/65, pulse oximetry 100% on oxygen.   GENERAL: No respiratory distress.   HEENT: Eyes: Conjunctivae and lids normal. Pupils equal, round, and reactive to light. Extraocular muscles intact. No nystagmus. Ears, nose, mouth,  and throat: Tympanic membranes no erythema. Nasal mucosa no erythema. Throat no erythema. No exudate seen. Lips and gums no lesions.   NECK: No JVD. No bruits. No lymphadenopathy. No thyromegaly. No thyroid nodules palpated.   RESPIRATORY: Poor air entry bilaterally, decreased breath sounds bilaterally.  Scattered rhonchi throughout the entire lung field.   CARDIOVASCULAR: S1, S2 irregular irregular, 2/6 systolic ejection murmur. Carotid upstroke 2+ bilaterally. No bruits. Dorsalis pedis pulses 1+ bilaterally, 3+ edema bilateral lower extremities.   ABDOMEN: Soft, nontender. No organomegaly/splenomegaly. Normoactive bowel sounds. No masses felt.   LYMPHATIC: No lymph nodes in the neck.   MUSCULOSKELETAL: 3+ edema. No clubbing. No cyanosis.   SKIN: Chronic lower extremity discoloration and scaling. Slight erythema on the left lower extremity but no warmth. Bruising on the upper extremities.   NEUROLOGICAL: Cranial nerves II through XII are grossly intact. Deep tendon reflexes 2+ bilateral lower extremities. The patient is able to straight leg raise bilaterally.   PSYCHIATRIC: The patient is oriented to person, place, and time.   LABORATORY, DIAGNOSTIC AND RADIOLOGIC DATA: CT scan of the head: No acute intracranial process. CT scan of the cervical spine showed no acute osseous injury. CT scan of the chest, abdomen, and pelvis showed T6 vertebral fracture, nondisplaced right inferior pubic rami fracture, patchy areas of ground glass opacities bilaterally secondary to infectious or inflammatory etiology versus mild pulmonary edema. White blood cell count 13.5, hemoglobin and hematocrit 11.1 and 33.0, platelet count 192. Glucose 178, BUN 48, creatinine 1.34, sodium 140, potassium 3.8, chloride 99, CO2 36, calcium 9.6. Liver function tests normal. GFR 47. INR 3.0. Urinalysis negative. Troponin borderline at 0.13. EKG shows atrial flutter, 97 beats per minute. This looks more like atrial fibrillation than atrial flutter, low voltage, nonspecific ST-T wave changes.   ASSESSMENT AND PLAN:  1. Pneumonia with leukocytosis: We will give Rocephin and Zithromax and continue that on a daily basis. ER physician ordered blood cultures. I will also order a sputum culture.  2. Falls with compression fracture of the  thoracic spine and a pubic ramus fracture:  We will get an Orthopedic evaluation and Physical Therapy evaluation. Pain control with oxycodone. We will check orthostatics. It may be difficult to do with a pubic rami fracture. The patient is a high fall risk since he does not walk too much, and when he does walk he has fallen three times in three days, could be secondary to deconditioning and venous pooling. I will check orthostatic vital signs.  3. Atrial fibrillation: The patient is a high risk for falls. We will stop Coumadin at this point. The risks outweigh the benefits in an 81 year old man who is falling when he is walking. We will give an aspirin for anticoagulation. The patient will be on metoprolol for rate control.  4. End-stage chronic obstructive pulmonary disease on oxygen: I will give antibiotics for pneumonia. I will hold off on steroids at this point and continue his inhalers.  5. History of congestive heart failure: Looking back at last echocardiogram, his ejection fraction back August 2013 was 50 to 55% with right ventricular systolic pressure elevated. This is diastolic in nature. I do not think this is acute congestive heart failure, but I will continue his Lasix for diuresis, lisinopril and the metoprolol.  6. Diabetes:  I am going to stop the Glucophage. I am hesitant about Glucophage in an 81 year old guy with chronic kidney disease. I will put on sliding scale for right now. Check a hemoglobin A1c in the  a.m.  7. Chronic kidney disease: I will continue to monitor with diuresis.  8. Borderline troponin: Could be secondary to the pneumonia and the chronic respiratory failure. We will get serial cardiac enzymes. We will give aspirin and continue metoprolol.  CODE STATUS:  The patient is a FULL CODE.     TIME SPENT ON ADMISSION: 55 minutes.    ____________________________ Tana Conch. Leslye Peer, MD [id number]:[id number] D: 10/16/2012 18:50:40 ET T: 10/17/2012 07:56:24 ET JOB#: [id number]  cc: Tana Conch. Leslye Peer, MD, <Dictator> Donnie Coffin, MD in [place] SIGNED 11/08/2012 17:26

## 2015-03-27 NOTE — Consult Note (Signed)
Chief Complaint:   Subjective/Chief Complaint Breathing is better this am. NO chest pain.   VITAL SIGNS/ANCILLARY NOTES: **Vital Signs.:   11-Aug-13 08:00   Vital Signs Type Routine   Temperature Temperature (F) 98.3   Celsius 36.8   Pulse Pulse 86   Respirations Respirations 18   Systolic BP Systolic BP 161   Diastolic BP (mmHg) Diastolic BP (mmHg) 71   Mean BP 90   Pulse Ox % Pulse Ox % 92   Pulse Ox Activity Level  At rest   Oxygen Delivery 3L; Nasal Cannula  *Intake and Output.:   11-Aug-13 00:27   Grand Totals Intake:   Output:  100    Net:  -100 24 Hr.:  -310    04:16   Grand Totals Intake:   Output:  125    Net:  -125 24 Hr.:  -435    06:00   Grand Totals Intake:   Output:  125    Net:  -125 24 Hr.:  -560    06:30   Grand Totals Intake:   Output:  100    Net:  -100 24 Hr.:  -660    06:53   Grand Totals Intake:   Output:  100    Net:  -100 24 Hr.:  -760    Shift 07:00   Grand Totals Intake:   Output:  550    Net:  -550 24 Hr.:  -760    Daily 07:00   Grand Totals Intake:  240 Output:  1000    Net:  -760 24 Hr.:  -760    08:30   Grand Totals Intake:  240 Output:  200    Net:  40 24 Hr.:  40    09:00   Grand Totals Intake:  240 Output:  150    Net:  90 24 Hr.:  130    11:00   Grand Totals Intake:  250 Output:      Net:  250 24 Hr.:  380    Shift 15:00   Grand Totals Intake:  730 Output:  350    Net:  380 24 Hr.:  380   Brief Assessment:   Cardiac Irregular  murmur present  + LE edema  1+right lower ext edema, trace left lower ext edema    Respiratory normal resp effort  clear BS    Gastrointestinal Normal    Gastrointestinal details normal Soft  Nontender  Nondistended  Bowel sounds normal   Lab Results: Routine Chem:  11-Aug-13 04:13    Glucose, Serum  119   BUN  34   Creatinine (comp)  1.40   Sodium, Serum 143   Potassium, Serum 3.9   Chloride, Serum 101   CO2, Serum  38   Calcium (Total), Serum 9.1   Anion Gap  4    Osmolality (calc) 294   eGFR (African American)  52   eGFR (Non-African American)  45 (eGFR values <65mL/min/1.73 m2 may be an indication of chronic kidney disease (CKD). Calculated eGFR is useful in patients with stable renal function. The eGFR calculation will not be reliable in acutely ill patients when serum creatinine is changing rapidly. It is not useful in  patients on dialysis. The eGFR calculation may not be applicable to patients at the low and high extremes of body sizes, pregnant women, and vegetarians.)  Routine Coag:  11-Aug-13 04:13    Prothrombin  25.8   INR 2.3 (INR reference interval applies to patients on anticoagulant therapy. A  single INR therapeutic range for coumarins is not optimal for all indications; however, the suggested range for most indications is 2.0 - 3.0. Exceptions to the INR Reference Range may include: Prosthetic heart valves, acute myocardial infarction, prevention of myocardial infarction, and combinations of aspirin and anticoagulant. The need for a higher or lower target INR must be assessed individually. Reference: The Pharmacology and Management of the Vitamin K  antagonists: the seventh ACCP Conference on Antithrombotic and Thrombolytic Therapy. NOBSJ.6283 Sept:126 (3suppl): N9146842. A HCT value >55% may artifactually increase the PT.  In one study,  the increase was an average of 25%. Reference:  "Effect on Routine and Special Coagulation Testing Values of Citrate Anticoagulant Adjustment in Patients with High HCT Values." American Journal of Clinical Pathology 2006;126:400-405.)  Routine Hem:  11-Aug-13 04:13    WBC (CBC)  12.0   RBC (CBC)  3.89   Hemoglobin (CBC)  12.3   Hematocrit (CBC)  36.1   Platelet Count (CBC) 165   MCV 93   MCH 31.6   MCHC 34.0   RDW  14.8   Neutrophil % 83.1   Lymphocyte % 7.9   Monocyte % 6.8   Eosinophil % 1.3   Basophil % 0.9   Neutrophil #  10.0   Lymphocyte #  0.9   Monocyte # 0.8    Eosinophil # 0.2   Basophil # 0.1 (Result(s) reported on 18 Jul 2012 at 04:40AM.)   Assessment/Plan:  Assessment/Plan:   Assessment 1) Respiratory distress secondary to COPD exacerbation, diastolic heart failure. Pt with normal LV systolic function and no significant valvular disease. Agree with continued IV Lasix today and switch to po Lasix tonight.   2) Atrial fibrillation: Rate controlled. Continue current therapy. INR is therapeutic.   3) COPD/pneumonia: Per primary team. Currently on inhalers, chronic oxygen therapy.   Electronic Signatures: Burnell Blanks (MD)  (Signed 11-Aug-13 12:04)  Authored: Chief Complaint, VITAL SIGNS/ANCILLARY NOTES, Brief Assessment, Lab Results, Assessment/Plan   Last Updated: 11-Aug-13 12:04 by Burnell Blanks (MD)

## 2015-03-27 NOTE — Discharge Summary (Signed)
PATIENT NAME:  [facility] Reese, Garrett Reese MR#:  [id number] DATE OF BIRTH:  [birth year]  DATE OF ADMISSION:  10/16/2012 DATE OF DISCHARGE:  10/19/2012  PRIMARY CARE PHYSICIAN: Donnie Coffin, MD  DISCHARGE DIAGNOSES:  1. Community-acquired pneumonia.  2. Recurrent falls.  3. Chronic atrial fibrillation.  4. Right inferior pubic ramus fracture.  5. T6 compression fracture.  6. 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  7. Degenerative joint disease, stage III.  8. Diabetes mellitus, type II.   IMAGING STUDIES: CT scan of the chest and abdomen showed acute fracture of T6 vertebral body and patchy areas of ground-glass opacities bilaterally in the lungs and a nondisplaced right inferior pubic ramus fracture.   CONSULTANTS: Earnestine Leys, MD - Orthopedics.   ADMITTING HISTORY AND PHYSICAL: Please see detailed history and physical dictated by Dr. Loletha Grayer on 10/16/2012. In brief, this is an 81 year old patient with chronic atrial fibrillation, on chronic anticoagulation, with atrial fibrillation, who had recurrent falls at home with some dizziness, who presented to the Emergency Room, had a T6 compression fracture and right inferior pubic ramus fracture, and was admitted to the hospitalist service after he was found to have pneumonia.   HOSPITAL COURSE:  1. Multiple fractures. The patient did have a right inferior pubic ramus fracture and T6 compression fracture secondary to the fall and was seen by Dr. Sabra Heck of orthopedics who suggested conservative management and physical therapy. The patient has refused physical therapy in the hospital. He was recommended skilled nursing facility, but has constantly refused this. I have discussed with the patient and his daughter. The daughter mentioned the patient does not agree to any physical therapy even at home and the patient is being discharged home as he has refused all other options. The patient is a  high risk for fall. This was discussed with the patient and his daughter. I tried to set up home health for this patient, but the patient has refused even home health.  2. Chronic atrial fibrillation. The patient was continued on metoprolol from home with which his atrial fibrillation has been controlled. He does have occasional PVCs, which is a chronic problem. His Coumadin has been stopped secondary to recurrent falls as his risks outweigh the benefits with high risk for hemorrhage from the falls. The patient is being continued on a baby aspirin, at the time of discharge, for stroke prevention.  3. Pneumonia. The patient did have bilateral ground-glass opacities for which he has been started on antibiotics. He is improving well. This was the cause for his dizziness, which has resolved at the time of discharge. He will be continued on antibiotics for four more days.  4. The patient's diabetes and chronic obstructive pulmonary disease were stable during the hospital stay.   On the day of discharge, the patient's temperature was 97.4, pulse 73, respirations 18, blood pressure 112/66, and saturating 98% on 2 liters oxygen. Lungs show no wheezing or crackles. He is being discharged home with guarded prognosis due to recurrent falls and multiple comorbidities.   DISCHARGE MEDICATIONS:  1. Aspirin 81 mg oral once a day.  2. Spiriva 18 mcg inhaled once a day.  3. Metformin 500 mg oral twice a day. 4. Symbicort 160/4.5 two puffs inhaled twice a day. 5. Metoprolol tartrate 50 mg 1-1/2 tablets oral once a day.  6. Allegra 24 R one tablet once a day.  7. ProAir HFA 2 puffs inhaled as needed every four hours for shortness of breath.  8. Imipramine 25 mg oral twice prior to bed.  9. Lasix 20 mg oral once a day.  10. Oxycodone 5 mg oral every four hours as needed for pain.  11. Azithromycin 500 mg oral once a day for three days.  12. Cefuroxime 500 mg oral twice a day for five days.   DISCHARGE INSTRUCTIONS:  The patient needs to stop his Coumadin. He will follow up with his primary care physician in a week. He is a high fall risk. This has been explained to the patient and daughter. The patient has refused home health physical therapy, skilled nursing facility, or assisted living, and unfortunately the patient is being discharged home at his request.   TIME SPENT: Time spent today on this discharge dictation along with coordinating care and counseling of the patient on the day of discharge was 50 minutes. ____________________________ Leia Alf Rakan Soffer, MD srs:slb D: 10/19/2012 14:51:49 ET T: 10/20/2012 10:32:50 ET JOB#: [id number]  cc: Alveta Heimlich R. Darvin Neighbours, MD, <Dictator> PCP - Donnie Coffin, MD - [place] MD ELECTRONICALLY SIGNED 10/23/2012 9:00

## 2015-03-27 NOTE — Consult Note (Signed)
Chief Complaint:   Subjective/Chief Complaint Breathing is better today. No pain.   VITAL SIGNS/ANCILLARY NOTES: **Vital Signs.:   10-Aug-13 07:48   Vital Signs Type Routine   Temperature Temperature (F) 97.8   Celsius 36.5   Temperature Source oral   Pulse Pulse 85   Respirations Respirations 18   Systolic BP Systolic BP 518   Diastolic BP (mmHg) Diastolic BP (mmHg) 76   Mean BP 92   Pulse Ox % Pulse Ox % 95   Pulse Ox Activity Level  At rest   Oxygen Delivery 3L   Brief Assessment:   Cardiac Irregular  murmur present    Respiratory normal resp effort  faint crackles bilateral bases    Gastrointestinal Normal    Gastrointestinal details normal Soft  Nontender  Bowel sounds normal    Additional Physical Exam Trace lower extremity edema.   Lab Results: Routine Chem:  10-Aug-13 02:41    B-Type Natriuretic Peptide [facility])  [id number] (Result(s) reported on 17 Jul 2012 at 03:49AM.)   Cholesterol, Serum 160   Triglycerides, Serum 102   HDL (INHOUSE)  35   VLDL Cholesterol Calculated 20   LDL Cholesterol Calculated  105 (Result(s) reported on 17 Jul 2012 at 03:49AM.)  Routine Coag:  10-Aug-13 02:41    Prothrombin  26.6   INR 2.4 (INR reference interval applies to patients on anticoagulant therapy. A single INR therapeutic range for coumarins is not optimal for all indications; however, the suggested range for most indications is 2.0 - 3.0. Exceptions to the INR Reference Range may include: Prosthetic heart valves, acute myocardial infarction, prevention of myocardial infarction, and combinations of aspirin and anticoagulant. The need for a higher or lower target INR must be assessed individually. Reference: The Pharmacology and Management of the Vitamin K  antagonists: the seventh ACCP Conference on Antithrombotic and Thrombolytic Therapy. ACZYS.0630 Sept:126 (3suppl): N9146842. A HCT value >55% may artifactually increase the PT.  In one study,  the increase was an average  of 25%. Reference:  "Effect on Routine and Special Coagulation Testing Values of Citrate Anticoagulant Adjustment in Patients with High HCT Values." American Journal of Clinical Pathology 2006;126:400-405.)   Radiology Results: Cardiology:    09-Aug-13 10:41, Echo Doppler   Echo Doppler    Interpretation Summary    Left ventricular systolic function is low normal. Ejection Fraction =   50-55%. There is moderate concentric left ventricular hypertrophy.   There is moderate apical wall hypokinesis. The right ventricular   systolic function is normal. The left atrium is mildly dilated.   There is mild mitral regurgitation. Right ventricular systolic   pressure is elevated at 40-19mHg.    PatientHeight: 170 cm    PatientWeight: 99 kg    BSA: 2.1 m2    Procedure:    A two-dimensional transthoracic echocardiogram with color flow and   Doppler was performed.    The study was technically difficult with many images being suboptimal   in quality.    Left Ventricle    There is moderate apical wall hypokinesis.    Ejection Fraction = 50-55%.    The transmitral spectral Doppler flow pattern is normal for age.    Regional wall motion abnormalities cannot be excluded due to limited   visualization.    The left ventricle is not well visualized.    There is moderate concentric left ventricular hypertrophy.    Left ventricular systolic function is low normal.    Right Ventricle    The right ventricle  is normal size.    The right ventricular systolic function is normal.    Atria    The left atrium is mildly dilated.    Right atrial sizeis normal.    There is no Doppler evidence for an atrial septal defect.    Mitral Valve    There is mild mitral regurgitation.    The mitral valve is grossly normal.    There is no mitral valve stenosis.    Tricuspid Valve    The tricuspid valve is not well visualized, but is grossly normal.    There is mild tricuspid regurgitation.    Right  ventricular systolic pressure is elevated at 40-9mHg.    Aortic Valve    The aortic valve is not well visualized.    The aortic valve opens well.    No aortic regurgitation is present.    No hemodynamically significant valvular aortic stenosis.    Pulmonic Valve    There is no pulmonic valvular regurgitation.    The pulmonic valve is not well seen, but is grossly normal.    Great Vessels    The aortic root is normal size.    The pulmonary is not well visualized.    Pericardium/Pleural    There is no pleural effusion.    No pericardial effusion.    MMode 2D Measurements and Calculations    RVDd: 3.0 cm    IVSd: 1.6 cm    LVIDd: 4.2 cm    LVIDs: 3.1 cm    LVPWd: 1.0 cm    FS: 26 %    EF(Teich): 51 %    Ao root diam: 3.7 cm    LA dimension: 4.3 cm    LVOT diam: 2.2 cm    Doppler Measurements and Calculations    MV E point: 102 cm/sec    MV dec time: 0.16 sec    Ao V2 max: 125 cm/sec    Ao max PG: 6.0 mmHg    AVA(V,D): 2.1 cm2    LV max PG: 2.0 mmHg    LV V1 max: 70 cm/sec    PA V2 max: 79 cm/sec    PA max PG: 3.0 mmHg    TR Max vel: 259 cm/sec    TR Max PG: 27 mmHg    RVSP: 32 mmHg    RAP systole: 5.0 mmHg    Reading Physician: GIda Rogue  Sonographer: HSherrie Sport Interpreting Physician:  TIda Rogue  electronically signed on   07-16-2012 13:28:47  Requesting Physician: GIda Rogue  Assessment/Plan:  Assessment/Plan:   Assessment 1) Respiratory distress secondary to COPD exacerbation, diastolic heart failure. Echo reviewed. Pt with normal LV systolic function and no significant valvular disease. Agree with continued IV Lasix today as he appears to have continued mild volume overload. Hopefully, can switch to po Lasix in am.  2) Atrial fibrillation: Rate controlled. Continue current therapy. INR is therapeutic.   3) COPD: Per primary team. Currently on inhalers, chronic oxygen therapy.   Electronic Signatures: MBurnell Blanks(MD)   (Signed 10-Aug-13 11:21)  Authored: Chief Complaint, VITAL SIGNS/ANCILLARY NOTES, Brief Assessment, Lab Results, Radiology Results, Assessment/Plan   Last Updated: 10-Aug-13 11:21 by MBurnell Blanks(MD)

## 2015-03-27 NOTE — Discharge Summary (Signed)
PATIENT NAME:  Gar[facility] Reese, Garrett Reese MR#:  [id number] DATE OF BIRTH:  1921/05/31  DATE OF ADMISSION:  10/31/2012 DATE OF DISCHARGE:  11/02/2012   PRIMARY CARE PHYSICIAN: Dr. Jasper Riling at [facility]: Shortness of breath and falls.   DISCHARGE DIAGNOSES:  1. Acute on chronic diastolic congestive heart failure. 2. Chronic respiratory failure from end-stage chronic obstructive pulmonary disease, some pulmonary fibrosis.  3. Recurrent falls, likely mechanical.  4. Diabetes.  5. Hypertension.  6. Chronic kidney disease.  7. Chronic atrial fibrillation.  8. Borderline elevated troponin likely from renal and respiratory failure.   DISCHARGE MEDICATIONS:  1. Symbicort 160/4.5 mcg 2 puffs two times a day.  2. Spiriva 18 mcg daily.  3. Allegra 24-hour allergy oral tab 1 tab daily.  4. Lasix 40 mg daily.  5. ProAir HFA inhaled 4 to 6 times a day as needed for shortness of breath.  6. Imipramine 25 mg 2 tabs once a day.  7. Metoprolol tartrate 25 mg two times a day.  8. Glipizide XL 2.5 mg 1 tab daily.  9. Aspirin 325 mg daily.   DO NOT TAKE:  1. Metformin. 2. Lisinopril. 3. Temazepam.  REFERRAL: The patient will be going home with home health including PT.   DIET: Low sodium, low fat, low cholesterol.   ACTIVITY: As tolerated.   FOLLOW-UP: Please follow with your primary care physician and cardiologist within 1 to 2 weeks.   DISPOSITION: Home with home health.   CODE STATUS: FULL CODE.   HISTORY OF PRESENT ILLNESS/HOSPITAL COURSE: See the dictation on 10/31/2012 by Dr. Anselm Jungling. Briefly, this is an 81 year old obese Caucasian male with end-stage COPD on chronic oxygen, history of CHF, diabetes, and venostasis ulcers with recent hospitalizations for COPD exacerbation, pneumonia, and falls who presented with worsening shortness of breath and falls. He was noted to have elevated BNP, some lower extremity edema and some congestion in his lungs and was felt to have acute  on chronic diastolic CHF and started on IV diuretics. He had been missing some of his Lasix as an outpatient. He also has had some falls recently which he attributes to falling over objects at his house. He was seen by physical therapy and disposition was home with home health. He appears set on being very independent and initially refused any kind of help. He did not want to go to rehab either. He was deemed to be dischargeable with home health and PT. He refused PT initially. At this point he is acceptable to PT once a week. Denies home nurse. He says his shortness of breath has resolved. He states he does not have any shortness of breath, cough, wheezing. At this point would transition him to his outpatient Lasix dose. He states he has chronic dyspnea on exertion. He states he is back at his baseline.   In regards to his diabetes, he is on metformin, however, with his chronic kidney disease, elevated creatinine, and history of CHF this is contraindicated. He will be started on low dose glipizide.   He has chronic atrial fibrillation that is rate controlled and he is not on anticoagulation given recurrent falls. He is on aspirin at this point.   His renal failure is chronic and stable.   At this point he will be discharged with outpatient follow-up.     TOTAL TIME SPENT: 35 minutes.   ____________________________ Vivien Presto, MD sa:drc D: 11/02/2012 12:08:08 ET T: 11/02/2012 13:49:26 ET JOB#: [id number]  cc: [id number]  Bridgette Habermann, MD, <Dictator> Vivien Presto MD ELECTRONICALLY SIGNED 11/18/2012 11:10

## 2015-03-27 NOTE — H&P (Signed)
PATIENT NAME:  Garrett Reese, Garrett Reese MR#:  [id number] DATE OF BIRTH:  1921/05/13  DATE OF ADMISSION:  07/16/2012  PRIMARY CARE PHYSICIAN: Dr. Donnie Coffin in [place]: Dry cough, getting worse for the last two days.   HISTORY OF PRESENT ILLNESS: Garrett Reese is an 81 year old Caucasian male who is troubled by cough which it nonproductive for the last two days. He reports no fever. No chest pain. He does have some shortness of breath especially with exertion but nothing has changed and this is chronic for him. He also has peripheral edema. The patient is on 2 liters of oxygen at home chronically due to prior history of chronic obstructive pulmonary disease and history of lung cancer status post resection. Patient was brought to the Emergency Department for evaluation of his cough. His chest x-ray showed mild right pleural effusion. His B-type natriuretic peptide is elevated. There is mild pulmonary vascular congestion and there is unexplained elevated white blood cell count as well. Additionally, the patient has atrial fibrillation with rapid ventricular rate. According to the patient he has history of irregular heart beat and maintained on Coumadin. Further blood work-up here showed mild elevation of troponin. Patient was admitted for further evaluation and treatment.    REVIEW OF SYSTEMS: CONSTITUTIONAL: Denies having any fever. No chills. No fatigue. EYES: No blurring of vision. No double vision. ENT: No hearing impairment. No sore throat. No dysphagia. CARDIOVASCULAR: Denies any chest pain. No exacerbation in his shortness of breath but he does have chronic exertional dyspnea. He does have peripheral edema on the right side more than the left. No syncope. RESPIRATORY: Reports dry cough x2 days. No chest pain. He has chronic dyspnea. GASTROINTESTINAL: No abdominal pain. No vomiting. No diarrhea. GENITOURINARY: No dysuria. No frequency of urination. MUSCULOSKELETAL: No joint pain or  swelling. No muscular pain or swelling. INTEGUMENTARY: No skin rash. No ulcers but he has chronic skin ecchymosis in association with his Coumadin. NEUROLOGY: No focal weakness. No seizure activity. No headache. PSYCHIATRY: No anxiety. No depression. ENDOCRINE: No heat or cold intolerance. No polyuria or polydipsia.   PAST MEDICAL HISTORY:  1. Lung cancer status post resection in 1991.  2. Chronic obstructive pulmonary disease, home oxygen dependent on 2 liters. 3. Chronic atrial fibrillation.  4. Patient was also notified in the past that he has congestive heart failure. He was told that about five years ago.   PAST SURGICAL HISTORY: History of cholecystectomy five years ago.   SOCIAL HABITS: Ex chronic smoker. He quit smoking in 1981. Prior to that he used to smoke 1-1/2 a pack a day since age of a 19. No history of alcoholism.   SOCIAL HISTORY: He is widowed. Lives at home. Cared by his daughter. 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   FAMILY HISTORY: His father died at the age of 70 from a heart attack. His mother died at the age of 41 after complications of ruptured hernia complicated by sepsis.   ADMISSION MEDICATIONS:  1. Coumadin or warfarin 5 mg once a day.  2. The rest of his medications patient does not know the doses of his medicines: Symbicort 2 puffs twice a day. 3. Spiriva 1 inhalation once a day. 4. Metoprolol 1-1/2 tablets twice a day. 5. Metformin 1 tablet twice a day. 6. Lasix dose is not specified but he takes 4 tablets once a day.   ALLERGIES: No known drug allergies.   PHYSICAL EXAMINATION:  VITAL SIGNS: Blood pressure 126/69, respiratory rate 26, pulse  110 to 115 irregular, temperature 99.2, oxygen saturation 92%.   GENERAL APPEARANCE: Elderly male laying in bed in no acute distress.   HEAD AND NECK EXAMINATION: No pallor. No icterus. No cyanosis.   ENT: Hearing was normal. Nasal mucosa, lips, tongue were normal. He is edentulous, has no dentures.   EYES: Normal  eyelids and conjunctiva. Pupils about 4 to 5 mm, equal and sluggishly reactive to light.   NECK: Supple. Trachea at midline. No thyromegaly. No cervical lymphadenopathy. No masses.   HEART: Irregular S1, S2. No S3, S4. No murmur. No gallop. No carotid bruits.   RESPIRATORY: Slight tachypnea. There are fine rales at the bases on the right more than the left with diminished breathing sounds at the right base.   ABDOMEN: Soft without tenderness. No hepatosplenomegaly. No masses. No hernias.   SKIN: No ulcers. No subcutaneous nodules. He has peripheral edema +2 on the right ankle area, +1 on the left.   MUSCULOSKELETAL: No joint swelling. No clubbing.   NEUROLOGIC: Cranial nerves II through XII are intact. No focal motor deficit.   PSYCHIATRIC: Patient is alert, oriented x3. Mood and affect were normal.   LABORATORY, DIAGNOSTIC, AND RADIOLOGICAL DATA: EKG showed atrial fibrillation with rapid ventricular rate at rate of 120 per minute. Low voltage. Poor progression of R waves in the anterior chest leads, otherwise unremarkable EKG. Chest x-ray showed small right pleural effusion with mild right basilar opacities, may represent atelectases or infection. There is a subcentimeter nodular density in the mid lung, maybe calcified. This needs follow up. Lobular density overlying the left mid lung may represent prior ossification of the ribs. There is some pulmonary vascular congestion as well. Blood sugar 210. B-type natriuretic peptide (BNP) was elevated at 4079. BUN 28, creatinine 1.4, sodium 141, potassium 4.4. Total CPK 40. Troponin elevated at 0.13. TSH was normal at 1.09. CBC showed white count 21,900, hemoglobin 13, hematocrit 39, platelet count 171. Prothrombin time 24 with INR of 2.2 and PTT 48.   ASSESSMENT:  1. Decompensated congestive heart failure evidenced by right pleural effusion, pulmonary vascular congestion, peripheral edema and cough and elevated BNP. Patient did not have an  echocardiogram for many years. Will order one.  2. Cough. This is either secondary to his decompensated congestive heart failure or subtle right basilar pneumonia.  3. Mild right pleural effusion.  4. Elevated white blood cell count reaching 21,000 likely either secondary to right-sided lower lobe pneumonia versus urinary tract infection. I am awaiting his urinalysis and urine culture.  5. Chronic atrial fibrillation presenting with rapid ventricular rate.  6. Chronic anticoagulation with Coumadin.  7. Chronic obstructive pulmonary disease, home oxygen dependent on 2 liters. 8. Prior history of lung cancer status post resection in 1991.   9. Diabetes mellitus, type 2. 10. Elevated troponin either secondary to non-ST elevation acute myocardial infarction versus demand ischemia secondary to atrial fibrillation with rapid ventricular rate.   PLAN: Will admit the patient to the medical floor on telemetry monitoring. Follow up on troponin level. Add aspirin 325 mg a day. Continue anticoagulation with Coumadin. Beta blocker using metoprolol, will give empiric dose of 25 mg twice a day until we verify his doses tomorrow morning with his pharmacy or his primary care physician. Intravenous Lasix 40 mg twice a day x2 days. Hold p.o. Lasix, to be resumed after finishing intravenous Lasix and also verifying his oral dose. Obtain echocardiogram. Consult cardiology. For his elevated white blood cell count blood cultures x2 were ordered and  urine culture as well along with urinalysis. Empiric IV antibiotic using Rocephin was started. I will repeat his B-type natriuretic peptide tomorrow. I spoke with the patient and his daughter. He does not have a LIVING WILL. In fact, he used to and he canceled that. He had appointed his daughter, Jim Like, to have the power of attorney.   TIME SPENT EVALUATING THIS PATIENT: Took more than one hour.   ____________________________ Clovis Pu. Lenore Manner, MD amd:cms D: 07/16/2012  00:59:23 ET T: 07/16/2012 07:37:15 ET JOB#: [id number]  cc: Clovis Pu. Lenore Manner, MD, <Dictator> Dr. Donnie Coffin in [place] MD ELECTRONICALLY SIGNED 07/16/2012 22:04

## 2015-03-27 NOTE — H&P (Signed)
PATIENT NAME:  Garrett Reese, Garrett Reese MR#:  [id number] DATE OF BIRTH:  10/14/1921  DATE OF ADMISSION:  10/31/2012  PRIMARY CARE PHYSICIAN: Dr. Lilian Coma, [place], [facility]   PRESENTING COMPLAINT: Worsening shortness of breath and falls.   HISTORY OF PRESENT ILLNESS: This is an 81 year old male with past medical history of lung cancer status post resection, end-stage chronic obstructive pulmonary disease on 2 liters nasal cannula oxygen supplementation at home, chronic atrial fibrillation, history of congestive heart failure, diabetes, and venostasis ulcer. He was recently in the hospital 15 days ago for COPD exacerbation and pneumonia. He was given treatment for that and sent home but he says that apparently he never felt better after that. He finished his antibiotic course but continued feeling weak and short of breath. He had four falls in the last 10 days since he went home and he attributes some of them to tripping over the oxygen pipe which is running throughout his home. He has oxygen supply at home. This has been a problem for him causing frequent falls in the past also. He has some injuries on his arms and bruises because of those falls also. He said that he has been feeling increasingly short of breath and noticed his edema on the legs is worsening. He is taking Lasix at home for his CHF but he says he forgot for 2 to 3 days in the last 10 days and that might be the cause of that, though he denies any increase in his weight. He checks his weight frequently which is stable around 237 pounds. About sleeping habit, he is falling asleep every night in the recliner and then wakes up after the nap and goes to the bed and uses 1 to 2 pillows. He denies any change in his sleeping habits or feeling more short of breath while lying down flat. Denies any cough, fever, or wheezing. Denies any sick contacts. He came to the Emergency Room because of this problem and after evaluation he is given for  admission for his continued shortness of breath and he was started on BiPAP by ER.   REVIEW OF SYSTEMS: Denies fever, fatigue, weight loss or weight gain. EYES: Denies blurring or double vision Denies any discharge from the eyes. ENT: Denies tinnitus, ear pain, hearing loss, or discharge. RESPIRATORY: Denies any cough or wheezing but has been feeling short of breath. CARDIOVASCULAR: Denies chest pain or orthopnea but he has edema on his legs which is worsening. Denies any palpitations. No syncopal episodes. GASTROINTESTINAL: Denies any nausea, vomiting, diarrhea, abdominal pain. GENITOURINARY: Denies dysuria, hematuria, or increased frequency. ENDOCRINE: Denies polyuria or nocturia. HEMATOLOGIC: Denies any anemia. Has multiple bruises on the hands but he attributed it to frequent falls and bumping into the walls and furniture. SKIN: As discussed above, multiple bruises on the skin. MUSCULOSKELETAL: Denies any pain or swelling of joints. NEUROLOGICAL: Denies any numbness. Has weakness which is generalized and falls due to his living conditions and oxygen dependency but denies any seizure or tremor. PSYCHIATRIC: Denies any anxiety, insomnia, or depression.   PAST MEDICAL HISTORY:  1. Lung cancer status post resection. 2. End-stage chronic obstructive pulmonary disease, on chronic 2 liters of oxygen at home.  3. Chronic atrial fibrillation.  4. History of congestive heart failure.  5. Diabetes.  6. Chronic venostasis ulcer.   PAST SURGICAL HISTORY:  1. Lung resection. 2. Cholecystectomy.   ALLERGIES: No known drug allergy.  HOME MEDICATION AS PER DISCHARGE FROM LAST ADMISSION:  1. Spiriva 18  mcg inhalation capsule once a day. 2. Metformin 500 mg oral tablet 2 times a day.  3. Symbicort 2 puffs 2 times a day. 4. Metoprolol 50 mg oral tablet once a day.  5. Allegra 24-hour allergy oral tablet once a day. 6. Imipramine 25 mg oral tablet at bedtime. 7. Lasix 40 mg oral tablet 0.5 tablet once a day.   8. Oxycodone 5 mg oral tablet every four hours as needed for pain.  He was advised on last admission to stop taking warfarin, lisinopril, and temazepam.   PHYSICAL EXAMINATION:   VITAL SIGNS: Blood pressure 123/77, pulse rate 73, respirations 18, pulse oximetry 99 with oxygen supplementation on BiPAP, temperature 97.4.   GENERAL: He does not appear in any acute distress but currently he is on BiPAP with oxygen supplementation when I am examining him. He is fully alert and oriented and cooperative with physical examination and history taking, comfortable and very cooperative.   HEAD: Atraumatic. Conjunctivae pink. Oral mucosa moist. Sclerae anicteric. Hearing grossly intact.   NECK: No JVD. No neck rigidity.   RESPIRATORY: Bilateral creps present more on the right side basal than on left side. No wheezing. Bilateral equal air entry.   CARDIOVASCULAR: S1 and S2 present, regular. No murmurs appreciated. Edema of bilateral lower limbs present.   ABDOMEN: Soft, nontender. Bowel sounds present. No organomegaly appreciated.  SKIN: Multiple bruises present due to fall and scars due to old injuries.   NEUROLOGICAL: Moves all four limbs grossly normal. Sensation intact. Cranial nerves grossly normal.   PSYCHIATRIC: Does not appear in any acute psychiatric distress. No tremors or agitation present.   LABORATORY, DIAGNOSTIC, AND RADIOLOGICAL DATA: Glucose 158. BNP 5560. BUN 47, creatinine 1.71, sodium 136, potassium 3.5, chloride 94, CO2 37, calcium 9.3, total protein 7.9, albumin 3.7, bilirubin 0.9, alkaline phosphatase 155, SGOT 32, SGPT 19. Troponin 0.14. WBC 9.0, hemoglobin 10.6, platelets 172, MCV 93. Venous pH 7.36 and pCO2 81.   Chest x-ray finding elevation of right hemidiaphragm. With technique taken into consideration there is prominence of the interstitial marking as well as peribronchial cuffing. Cardiac silhouette is enlarged. The visualized bony skeleton is unremarkable. Degenerative  change is appreciated involving the right and left shoulders.   EKG atrial fibrillation with some PVCs.   ASSESSMENT AND PLAN:  1. Congestive heart failure exacerbation, acute systolic heart failure. Ejection fraction was 50 to 55% as per the previous echocardiogram done in August 2013. We will have to get one more echocardiogram this admission to see the worsening of his cardiac function. Will give IV Lasix and monitor him on telemetry. Monitor his input and output and his daily weight. Continue his metoprolol and he was taking at home and we will not give ACE inhibitors as it was discontinued in the last admission because he has chronic renal failure. Currently he is started on BiPAP for shortness of breath and edema by ER. I will continue that and we can take it off if he feels better tomorrow morning. Will follow his serial troponin. His baseline troponin is elevated up to 0.12 to 0.13 due to his chronic renal failure and his congestive heart failure problem. Currently there is no intervention needed for elevated troponin. That is his baseline.  2. Chronic atrial fibrillation. This is a chronic problem and the patient was taking Coumadin in the past but, as per the documents from last admission, he was advised to stop taking it because of frequent falls and multiple bruises on the body. I agree  with this decision. I will not give him any anticoagulation, just to rate control with beta-blocker.  3. COPD/history of lung cancer and lung resection, chronic pulmonary fibrosis on home oxygen. Currently he is stable from COPD point of view. No wheezing. Venous carbon dioxide level 83 which is not very high for a chronic retainer like him. Continue oral home medication, nebulizer, and oxygen. Currently is on BiPAP but we can take it off once he is feeling better with diuresis.  4. Diabetes mellitus. He was taking metformin at home but I would prefer to take him off metformin and stop it for future because of  congestive heart failure and chronic renal failure. His creatine is baseline at 1.6./1.7. I started him on glucose checks with insulin coverage.  5. Weakness, frequent falls. PT evaluation is called in. He might benefit from rehab placement for some physical therapy or assessment of his living condition as he says that he has been tripping over his oxygen pipes which is running throughout the house as he is on 24/7 oxygen and has long oxygen tubes in the house. 6. DVT and GI prophylaxis.   CODE STATUS: FULL CODE.   TOTAL TIME SPENT: 55 minutes.   ____________________________ Ceasar Lund Anselm Jungling, MD vgv:drc D: 10/31/2012 19:09:39 ET T: 11/01/2012 07:00:15 ET JOB#: [id number]  cc: Ceasar Lund. Anselm Jungling, MD, <Dictator> Dr. Lilian Coma, [place], [place] MD ELECTRONICALLY SIGNED 11/22/2012 22:46

## 2015-03-30 NOTE — Consult Note (Signed)
General Aspect primary care physician: Dr. Melene Plan Primary cardiologist: Dr. Golden Hurter in [place] Pulmonologist: Dr. Danton Sewer  Reason for consult: Atrial fibrillation and nonsustained ventricular tachycardia. 81 year old Caucasian male with severe COPD, smoking hx, hx of lung cancer, s/p resection, on chronic oxygen, chronic diastolic heart failure, chronic atrial fibrillation, used to be on anticoagulation with warfarin but was stopped in November due to recurrent falls and fractures.  he presented with right lower extremity cellulitis as well as productive cough. He was noted to be in atrial fibrillation with rapid ventricular response. He was started on digoxin which was discontinued later. He was noted to have recurrent nonsustained ventricular tachycardia on telemetry. He denies any chest pain. Previous echocardiogram in August of 2013 showed normal LV systolic function.    Present Illness . PAST MEDICAL HISTORY:  1. Lung cancer status post resection in 1991.  2. Chronic obstructive pulmonary disease, home oxygen dependent on 2 liters. 3. Chronic atrial fibrillation.  4. chronic diastolic heart failure 5.           recurrent falls.  PAST SURGICAL HISTORY: History of cholecystectomy five years ago.   SOCIAL HABITS: Ex chronic smoker. He quit smoking in 1981. Prior to that he used to smoke 1-1/2 a pack a day since age of a 80. No history of alcoholism.   SOCIAL HISTORY: He is widowed. Lives at home. Cared by his daughter. 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   FAMILY HISTORY: His father died at the age of 85 from a heart attack. His mother died at the age of 28 after complications of ruptured hernia complicated by sepsis.   Physical Exam:   GEN well developed, well nourished, no acute distress    HEENT red conjunctivae    NECK supple    RESP normal resp effort  on supplemental oxygen, scant rales    CARD Irregular rate and rhythm  No murmur    ABD denies  tenderness  soft    LYMPH negative neck    EXTR positive edema, Trace to 1 + pitting to the mid shin b/l, right >left    SKIN normal to palpation    NEURO motor/sensory function intact    PSYCH alert, A+O to time, place, person, good insight   Review of Systems:   Subjective/Chief Complaint cellulitis and cough    General: No Complaints    Skin: No Complaints    ENT: No Complaints    Eyes: No Complaints    Neck: No Complaints    Respiratory: Frequent cough  Short of breath    Cardiovascular: Dyspnea    Gastrointestinal: No Complaints    Genitourinary: No Complaints    Vascular: No Complaints    Musculoskeletal: No Complaints    Neurologic: No Complaints    Hematologic: No Complaints    Endocrine: No Complaints    Psychiatric: No Complaints    Review of Systems: All other systems were reviewed and found to be negative    Medications/Allergies Reviewed Medications/Allergies reviewed   Lab Results: Routine Chem:  25-Jan-14 04:39    Glucose, Serum  115   BUN  42   Creatinine (comp)  1.56   Sodium, Serum 137   Potassium, Serum 4.4   Chloride, Serum 98   CO2, Serum  34   Calcium (Total), Serum 9.0   Anion Gap  5   Osmolality (calc) 285   eGFR (African American)  45   eGFR (Non-African American)  39 (eGFR values <7mL/min/1.73  m2 may be an indication of chronic kidney disease (CKD). Calculated eGFR is useful in patients with stable renal function. The eGFR calculation will not be reliable in acutely ill patients when serum creatinine is changing rapidly. It is not useful in  patients on dialysis. The eGFR calculation may not be applicable to patients at the low and high extremes of body sizes, pregnant women, and vegetarians.)  Routine Hem:  25-Jan-14 04:39    WBC (CBC)  12.0   RBC (CBC)  3.54   Hemoglobin (CBC)  10.8   Hematocrit (CBC)  32.5   Platelet Count (CBC) 217   MCV 92   MCH 30.5   MCHC 33.2   RDW  15.2   Neutrophil % 84.9    Lymphocyte % 5.9   Monocyte % 8.5   Eosinophil % 0.4   Basophil % 0.3   Neutrophil #  10.2   Lymphocyte #  0.7   Monocyte # 1.0   Eosinophil # 0.1   Basophil # 0.0 (Result(s) reported on 01 Jan 2013 at 05:16AM.)   EKG:   Interpretation EKG shows atrial fibrillation  possible old anterior MI, LAFB    Additional Comments telemetry showed recurrent nonsustained ventricular tachycardia    Captopril: Unknown  Vital Signs/Nurse's Notes: **Vital Signs.:   25-Jan-14 12:18   Temperature Temperature (F) 97.9   Celsius 36.6   Temperature Source Oral   Pulse Pulse 90   Respirations Respirations 18   Systolic BP Systolic BP 817   Diastolic BP (mmHg) Diastolic BP (mmHg) 84   Mean BP 101   Pulse Ox % Pulse Ox % 97   Pulse Ox Activity Level  At rest   Oxygen Delivery 2L     Impression 1) nonsustained ventricular tachycardia: the patient was asymptomatic. eejection fraction is known to be normal. I recommend avoiding digoxin and treatment with metoprolol.    2) chronic Atrial fibrillation: continue rate control with metoprolol. He used to be on warfarin which was stopped in November due to recurrent falls and fractures. This will have to be reevaluated at some point as his risk of stroke is very high. If his risk of falling is better than before, we might have to think about resuming anticoagulation.  3) mildly elevated cardiac enzymes likely due to supply demand ischemia in the setting of chronic diastolic heart failure. Given his age and comorbidities, I don't plan further ischemic workup at this point.  4) COPD  5) Lung CA, s/p resection  6) cellulitis: on antibiotics.   Electronic Signatures: Kathlyn Sacramento (MD)  (Signed 25-Jan-14 13:35)  Authored: General Aspect/Present Illness, History and Physical Exam, Review of System, Labs, EKG , Allergies, Vital Signs/Nurse's Notes, Impression/Plan   Last Updated: 25-Jan-14 13:35 by Kathlyn Sacramento (MD)

## 2015-03-30 NOTE — H&P (Signed)
PATIENT NAME:  Garrett Reese, Garrett Reese MR#:  [id number] DATE OF BIRTH:  09/18/1921  DATE OF ADMISSION:  12/28/2012  PRIMARY CARE PHYSICIAN: Dr. Marcell Anger in [facility]   HISTORY OF PRESENT ILLNESS: The patient is an 81 year old Caucasian male with past medical history significant for history of congestive heart failure, diastolic, history of chronic atrial fibrillation, history of CKD, chronic respiratory failure as well as COPD, diabetes, hypertension, and other multiple medical problems, who presented to the hospital with complaints of right lower extremity redness as well as swelling.  According to the patient, he was doing well up until approximately a week ago when he noted a blister in his right lower extremity in lateral aspect of his right tibia. He decided to burst the blister.  He used a sterilized needle to have that done and he peeled the skin and he drained water from it. Since that time, he has been noticing problems with the right lower extremity. He has been having problems with swelling as well as increasing pain and increasing redness of the right lower extremity. He denies any significant fevers or chills; however, admits of having some cough for the past 2 weeks or so  now. He has been also complaining of lower extremity swelling.   PAST MEDICAL HISTORY: Significant for history of CHF diastolic, chronic respiratory failure, COPD, fibrosis, falls recurrent, diabetes mellitus, hypertension, CKD, atrial fibrillation, chronic, also elevated troponin. Most recent admission was in November 2013 for congestive heart failure. Also, history of pneumonia in August 2013, history of generalized weakness, history of left lung resection for lung carcinoma, history of chronic respiratory failure on 2 liters of oxygen through nasal cannula at home.  He was on Coumadin anticoagulation for chronic atrial fibrillation, and now seems to be off Coumadin.   MEDICATIONS: According to medical records, the patient is  on Allegra 24 hour oral tablet once daily, aspirin 81 mg p.o. daily, glipizide XL 2.5 mg p.o. daily, Imipramine 25 mg p.o. at bedtime, MetroCream 0.75% topical cream to affected area once a day, Mucinex 600 mg  p.o. twice a day, ProAir HFA 2 puffs every 4 hours as needed, Promethazine with codeine 6.25 mg/10 mg (solution) every 4 hours as needed, Spiriva 18 mcg inhalation daily, Symbicort 160/4.5, 2 puffs twice a day. Previously on ACE inhibitor, as well as beta blocker and Lasix. It does not seem to be that he is taking any of those at this moment.   PAST SURGICAL HISTORY: Cholecystectomy approximately 5 or 6 years ago.   SOCIAL HISTORY: Chronic ex-smoker, quit smoking in 1981. Prior to that,  smoked 1-1/2 packs a day since age of 37. No history of alcoholism.   SOCIAL HISTORY: He is widowed, lives at home and cared by his daughter. 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   FAMILY HISTORY: The patient's father died at the age of 23 of heart attack. The patient's mother died at the age of 64, complications of ruptured hernia, complicated by sepsis.   ALLERGIES: No known drug allergies.   REVIEW OF SYSTEMS: Positive for right lower extremity pain as well as swelling. Also weight, approximately being stable around 200 to 202, muscle tightness in his lower extremities bilaterally, especially whenever he tries to walk, also fatigue and weakness, which seems to be chronic, feeling lightheaded and dizzy for the past 2 days, also having some cough with wheezing and whitish phlegm production, shortness of breath and lower extremity swelling. Denies any high fevers, weight loss or gain.  EYES: Denies  any blurry vision or glaucoma or cataracts.  ENT: Denies any tinnitus, allergies, epistaxis, sinus pain, dentures, difficulty swallowing.  RESPIRATORY: Denies any hemoptysis, asthma, chronic obstructive pulmonary disease.  CARDIOVASCULAR: Denies any chest pains, orthopnea, arrhythmias or syncope.  GASTROINTESTINAL:  Denies nausea, vomiting, diarrhea, constipation.  GENITOURINARY: Denies any dysuria, hematuria, frequency, incontinence. ENDOCRINE:  Denies heat or cold intolerance, or thirst.  HEMATOLOGIC: Denies anemia, easy bruising.   SKIN: Denies any acne, rashes, or change in moles.  MUSCULOSKELETAL: Denies arthritis, cramps, or swelling.  NEUROLOGIC: No numbness, epilepsy or tremor.  PSYCHIATRIC: Denies anxiety, insomnia or depression.   PHYSICAL EXAMINATION: VITAL SIGNS: On arrival to the hospital, temperature is 98.3, pulse 89, respirations 20, blood pressure 142/86, saturation 94% on oxygen therapy.   GENERAL: This is a well-developed, well-nourished Caucasian male in no significant distress, comfortable on the stretcher.  HEENT: Pupils are equal and reactive to light. Extraocular movements intact. No icterus or conjunctivitis. Has normal hearing. No pharyngeal erythema. Mucosa is moist.   NECK: No masses, supple and nontender. Thyroid not enlarged. No adenopathy. No JVD or carotid bruits, full range of motion.  LUNGS: Diminished breath sounds. Rales as well as rhonchi were heard bilaterally as well as labored inspirations and increased effort to breathe. The patient is in mild to moderate respiratory distress, especially whenever he moves around. Crackles bilaterally in all lung fields.  CARDIOVASCULAR: S1, S2 appreciated. No murmurs, gallops, or rubs noted. Heart rate was irregularly irregular. No murmur. PMI not lateralized. Chest is nontender to palpation. 1+ pedal pulses, 2 to 3+ lower extremity edema, more on the right side was noted. No calf tenderness or cyanosis was noted.  ABDOMEN: Soft, nontender, bowel sounds are present. No hepatosplenomegaly or masses were noted.  RECTAL: Deferred.  MUSCULOSKELETAL: Able to move all extremities. No cyanosis, degenerative joint disease, mild kyphosis. Gait is not tested.  SKIN: Revealed erythematous rash in his right lower extremity below the knee. It was  in the lateral aspect of the right leg, right tibial area. He has approximately 2 to 3 inch diameter of lesion, shallow ulcer and erythematous base with some yellow slough. No nodularity or induration. Otherwise, skin was warm and dry to palpation. Weeping was noted bilateral lower extremity.  LYMPHATICS: No adenopathy in the cervical region.  NEUROLOGICAL: Cranial nerves grossly intact. Sensory is intact. No dysarthria or aphasia.  PSYCHIATRIC: The patient is alert and oriented to time, person, place, cooperative. Memory is good. No signs of confusion, agitation, or depression noted.   LABORATORY DATA: BMP showed BUN and creatinine of 49 and 1.96, glucose 132 otherwise BMP was unremarkable. Potassium level is low at 2.9, bicarbonate level is elevated to 38, liver enzymes not are not checked. Cardiac enzymes were not checked. White blood cell count is elevated to 27.8, hemoglobin 12.1, platelet count 213, absolute neutrophil count is high at 25.9.   EKG showed indeterminate rhythm, which atrial fibrillation at rate of 103 beats per minute, normal axis, low voltage QRS, cannot rule out inferior infarct, age indeterminate to EKG criteria. The patient does have T depressions in lateral leads, which is new since prior EKG done in November 2013.   RADIOLOGIC STUDIES: Ultrasound of right lower extremity Doppler 12/28/2012 showed no evidence of deep vein thrombosis in right lower extremity.   ASSESSMENT AND PLAN: 1.  SIRS due to cellulitis. Will obtain blood cultures as well and we initiated the patient on antibiotic therapy with Zyvox as well as meropenem. We will follow the patient's blood  culture results and will make decisions about antibiotic depending on need.  2. Lower extremity cellulitis, as above. We will continue Zyvox as well as meropenem. We will get wound care consult.  3.  Congestive heart failure,   left than the right heart, acute on chronic, diastolic. We will start the patient on Lasix p.o.  We are not able to initiate the patient on ACE inhibitor at this point due to worsening renal insufficiency, hypokalemia. We will get magnesium level.  We will continue patient on potassium every 6 hours. We will also give potassium orally with Lasix.  4.  Acute on chronic renal failure. We will follow with diuretic therapy. We will also get bladder scan and we will place Foley catheter if needed.  5.  Leukocytosis. We will follow with antibiotic therapy.  6.  Atrial fibrillation, rapid ventricular response. We will start the patient on digoxin orally at this point, just to stabilize him. We will follow the patient's digoxin level as the patient has chronic renal failure. We may initiate beta blockers upon discharge home, depending on his blood pressure readings.  7.  History of diabetes. Continue patient on current medications as well as sliding scale insulin.  8. History of chronic obstructive pulmonary disease, seemed to be stable. We will continue outpatient medications.  9. History of hypertension. The patient's blood pressure seems to be poorly controlled. We will initiate beta blockers at low dose.  We will follow patient's heart rate.  10.  Abnormal EKG with T depressions in lateral leads. Check cardiac enzymes x 3. Continue beta blockers.   TIME SPENT: 1 hour.    ____________________________ Theodoro Grist, MD rv:cc D: 12/28/2012 18:41:36 ET T: 12/28/2012 20:39:43 ET JOB#: [id number]  cc: Theodoro Grist, MD, <Dictator> Dr. Penne Lash MD ELECTRONICALLY SIGNED 01/23/2013 19:22

## 2015-03-30 NOTE — Discharge Summary (Signed)
PATIENT NAME:  Garrett Reese, LANGILLE MR#:  [id number] DATE OF BIRTH:  Oct 10, 1921  DATE OF ADMISSION:  12/28/2012 DATE OF DISCHARGE:  01/05/2013  ADMITTING DIAGNOSIS:  Systemic inflammatory response reaction due to right lower extremity cellulitis.  DISCHARGE DIAGNOSES:  1.  Systemic inflammatory response reaction.  2.  Right lower extremity cellulitis.  3.  Pneumonia, Enterococcus faecium.   4. Congestive heart failure, acute on chronic, diastolic.  5.  Anasarca due to congestive heart failure.  6.  Acute-on-chronic renal failure, resolved.  7.  Leukocytosis due to infection. 8.  Hypokalemia.  9.  Hypomagnesemia.  10.  Atrial fibrillation, rapid ventricular rate. 11.  Ventricular tachycardia, supraventricular tachycardia, resolved on beta-blockers. 12.  Chronic obstructive pulmonary disease, stable. 13.  Diabetes mellitus, likely type 2 diabetes mellitus per cardiologist.  14.  Elevated troponin.  15.  Atrial fibrillation, rapid ventricular rate.  16.  Hypoammonemia with questionable hepatic encephalopathy.  17.  Confusion, possible hospital delirium versus mild dementia versus delirium to medication. 18.  Dizziness.  19.  Generalized weakness.   DISCHARGE CONDITION:  Fair.  DISCHARGE MEDICATIONS:  The patient is to resume his outpatient medications which are:   1.  Mucinex 600 mg p.o. twice daily. 2.  Glipizide XL 2.5 mg p.o. daily.  3.  Allegra 1 tablet once daily. 4.  Aspirin 81 mg p.o. daily.  5.  ProAir HFA 2 puffs every 4 hours.  6.  Symbicort 160/4.5, 2 puffs twice daily.  7.  Metronidazole cream 0.75% topical cream once daily as needed.  8.  Spiriva 1 inhalation once daily.  9.  Promethazine with Codeine 6.25 mg/10 mg in 5 mL oral syrup, 5 mL every 4 hours as needed.  10.  Lactulose 10 grams in 15 mL oral syrup, 30 mL twice daily.  11.  Atorvastatin 10 mg p.o. at bedtime.  12.  Lopressor 50 mg p.o. twice daily.  13.  Furosemide 40 mg p.o. daily.  14.  Potassium chloride  10 mEq p.o. daily.  15.  Augmentin 500 mg twice a day.  16.  The patient is not to take imipramine.   DRESSING CARE:  Clean wound with Sea-Clens spray and gently dry with gauze.  Apply calcium alginate to help in draining wounds.  Cover with Telfa pad and secure with 4-inch Conform Wrap Gauze.    HOME OXYGEN:  Is portable tank at 3 liters of oxygen through nasal cannula.    DIET:  A 2-gram salt, low-fat, low-cholesterol, carbohydrate-controlled diet, regular consistency.   ACTIVITY LIMITATIONS:  As tolerated.   REFERRAL TO:  Home health, physical therapy, R.N. as well as aide.   FOLLOW-UP APPOINTMENT:  Dr. Allison Quarry in 2 days after discharge.   CONSULTANTS:  Wound care, care management and Dr. Fletcher Anon, cardiology.   RADIOLOGIC STUDIES:  Chest x-ray, PA and lateral 12/28/2012: Revealed opacities to the right lower lung which may be secondary to infection; interstitial opacities in mid and lower left lung may be related to some superimposed interstitial edema.  Follow-up PA and lateral were recommended to ensure clearance.  Doppler ultrasound of right lower extremity 12/28/2012:  No evidence of right lower extremity DVT.  Repeated chest x-ray, PA and lateral 01/02/2013:  Revealed a stable chest x-ray with cardiomegaly, mild edema and small bilateral effusions concerning for congestive heart failure.  Findings concerning for superimposed pneumonia in the lung bases was not excluded as well.  CT scan of head without contrast 01/02/2013: Revealed changes of atrophy and chronic microvascular ischemic  disease, stable appearance compared to the prior study.  No acute intracranial abnormality was evident.  Chest PA and lateral 01/05/2013:  Revealed findings consistent with pulmonary interstitial edema as well as small bilateral pleural effusions.  There is no definite focal pneumonia, but certainly interstitial pneumonia could present as well.  MRI of brain without contrast 01/05/2013: Revealed  involutional changes without evidence of acute abnormalities.   HISTORY OF PRESENT ILLNESS:  The patient is an 81 year old Caucasian male whose past medical history is significant for history of diabetes who presented to the hospital on 12/28/2012 with complaints of right lower extremity redness and swelling.  Please refer to Dr. Keenan Bachelor admission note on 12/28/2012.  Apparently he started having problems with blistering as well as swelling in his lower extremities, especially the right lower extremity and right side of his tibia, and he decided to burst a blister.  He used a sterilized needle and peeled the skin off and drained the water from it, and since that time he has noted significant swelling as well as redness in the area and increasing pain.  He also complained of having some cough for the past 2 weeks prior to coming to the hospital.  When arriving to the hospital, the patient's temperature was 98.3, pulse was 89, respiratory rate was 20, blood pressure 142/86, saturation was 94% on oxygen therapy.  Physical exam revealed rales as well as rhonchi bilaterally on lung exam and somewhat labored inspirations as well as increased effort to breathe.  The patient was in moderate respiratory distress. Crackles in bilateral lung fields were also noted.  Erythematous rash was noted to the right lower extremity below the knee and also a big, shallow ulcer with erythematous base and yellow slough as well as some weeping in the other extremity as well.  The patient's lab data on the day of admission, 12/28/2012, revealed elevated BUN and creatinine of 49 and 1.96, glucose 132, potassium was 2.9, otherwise BMP was unremarkable.  The patient's CBC showed white blood cell count 27.8, hemoglobin was 12.1, platelet count 213, absolute neutrophil count was 25.9.  Coagulation panel reveals a pro-time of 17.2. INR was 1.4.  Blood cultures x 2, 12/28/2012, showed no growth.  EKG showed undetermined rhythm at 103 beats per  minute, low voltage QRS, possible inferior infarct which was already cited in August 2013 and no significant acute ST-T changes were noted.  The patient's chest x-ray was concerning for congestive heart failure as well as possible pneumonia.  The patient was admitted to the hospital for further evaluation.  His ultrasound of the right lower extremity was performed; however, that showed no DVT.  The patient was started on broad-spectrum antibiotic therapy for his right lower extremity cellulitis as well as suspected pneumonia.  With this therapy, he significantly improved.  He was also initiated on diuretics because of congestive heart failure.  Over a period of time his condition improved.   HOSPITAL COURSE:   1.  In regards to systemic inflammatory response reaction, it was felt that the patient's systemic inflammatory response reaction very likely was related to cellulitis as well as pneumonia.  We were able to culture sputum and Enterococcus faecium was cultured in sputum, which was sensitive to linezolid as well as ampicillin.  The patient was initiated on broad-spectrum antibiotics; however, later on the patient's antibiotic was changed just to Augmentin.  The patient is to continue Augmentin for 7 more days and will follow up with his primary care physician for further  recommendations, checking his white blood cell count.  Meanwhile, his white blood cell count was followed while the patient was in the hospital, and his white blood cell count was trending down, improving.  As mentioned above, the patient's white blood cell count was 28,000 when he came to the hospital; however, on the day of discharge, 01/05/2013, the patient's white blood cell count was 13.1.  It is recommended to follow the patient's white blood cell count as outpatient and make decisions about changing antibiotics if needed.  It is also recommended to follow the patient's chest x-ray if his condition worsens.  Meanwhile, we repeated his  chest x-ray on 01/05/2013 just prior to leaving the hospital and there was no obvious pneumonia noted; some interstitial changes which were felt to be likely chronic congestive heart failure related, however, only noted.  2.  In regards to his cellulitis, the patient was seen by wound care clinic and was recommended dressings.  It was not felt that it was significant infection in that wound and Sea-Clens spray with calcium alginate and Telfa pad were recommended.  The patient is to continue to cover those areas every second day and he will be offered home health RN to help him with changes of his dressings.  3.  In regards to acute CHF, it was felt to be acute on chronic, diastolic, and we did not repeat the patient's echocardiogram as it was recently done in December 2013; however, we continued the patient on diuretics.  The patient is to continue Lasix upon discharge.  We did not initiate any ACE inhibitors because of the patient's history of chronic renal insufficiency.  It is recommended to advance the patient's Lasix if needed, especially if the patient's oxygen saturation drops down.  It is also recommended to follow the patient's creatinine levels with diuretic use.  On day of discharge, the patient's creatinine is 1.56 and his BUN is 33.  4.  The patient's anasarca was felt to be due to heart failure and this was resolving with diuretic use.  5.  The patient was noted to be hypokalemic as well as hypomagnesemic, These microelements were supplemented while in the hospital.   6.  The patient was noted to be in atrial fibrillation, RVR as well as a few episodes of V-Tach and SVT.  He was re-initiated back on beta blockers.  It was felt that patient should be continued on beta blockers indefinitely for better AFib control.  We did not initiate anticoagulation therapy because of his risks of falls; however, Dr. Fletcher Anon who followed the patient in the hospital felt that because of his high risk of stroke, he  may need to be started on anticoagulation therapy at some point if he is safe.  Dr. Fletcher Anon recommended continuing beta blockers to control his heart rate.  On day of discharge, the patient's heart rate is well controlled and it is ranging between 70s and 80s and his blood pressure is around 144R to 154M systolic.  It is recommended to follow the patient's heart rate as well as blood pressure and make decisions about advancing his medications if needed.  7.  The patient was noted to have mild elevation of troponin on arrival to the hospital.  Troponin increased to a maximum level of 0.44 on 12/28/2012; however, it was felt to be due to AFib, RVR.  No further recommendations were made by cardiologist and it was not considered to be injury or MI, but cardiologist felt it could have  been small type II MI.  The patient is to continue beta-blockers, aspirin as well as cholesterol medications as previously scheduled.   8.  The patient was noted to be intermittently confused while in the hospital.  The source of confusion was unclear.  It was felt to be possibly hospital related versus mild dementia.  Again, ammonia level was checked; was found to be slightly elevated around 40.  It was unclear why the patient was having hypo-ammonemia; however, hepatic encephalopathy was postulated.  The patient denied any alcohol use in his youth; however, the patient was initiated on lactulose and imipramine was stopped with hopes that that would alleviate his confusion.  He seemed to be a little bit better today on 01/05/2013.  He is being discharged home with above-mentioned medications and followup.  The patient was noted to have generalized weakness while in the hospital.  Physical therapist worked with him and home health physical therapy was recommended.  On day of discharge, the patient  had no discomfort.  He is being discharged to home with home health.  He is to follow up with his primary care physician, Dr. Marlynn Perking in  [place].     ____________________________ Theodoro Grist, MD rv:ea D: 01/05/2013 19:56:00 ET T: 01/05/2013 23:02:42 ET JOB#: [id number]  cc: Theodoro Grist, MD, <Dictator> Dr. Marlynn Perking      Bodey Frizell MD ELECTRONICALLY SIGNED 01/27/2013 18:44
